# Patient Record
Sex: Female | Born: 1975 | Race: Black or African American | Hispanic: No | Smoking: Current some day smoker
Health system: Southern US, Community
[De-identification: ages and names within clinical notes are randomized; demographics above are authoritative.]

## PROBLEM LIST (undated history)

## (undated) DIAGNOSIS — N926 Irregular menstruation, unspecified: Secondary | ICD-10-CM

## (undated) DIAGNOSIS — I10 Essential (primary) hypertension: Secondary | ICD-10-CM

## (undated) DIAGNOSIS — Z8719 Personal history of other diseases of the digestive system: Secondary | ICD-10-CM

## (undated) DIAGNOSIS — E079 Disorder of thyroid, unspecified: Secondary | ICD-10-CM

## (undated) DIAGNOSIS — M549 Dorsalgia, unspecified: Secondary | ICD-10-CM

## (undated) DIAGNOSIS — Z8639 Personal history of other endocrine, nutritional and metabolic disease: Secondary | ICD-10-CM

## (undated) DIAGNOSIS — K760 Fatty (change of) liver, not elsewhere classified: Secondary | ICD-10-CM

## (undated) DIAGNOSIS — D219 Benign neoplasm of connective and other soft tissue, unspecified: Secondary | ICD-10-CM

## (undated) DIAGNOSIS — T8859XA Other complications of anesthesia, initial encounter: Secondary | ICD-10-CM

## (undated) DIAGNOSIS — K439 Ventral hernia without obstruction or gangrene: Secondary | ICD-10-CM

## (undated) DIAGNOSIS — G589 Mononeuropathy, unspecified: Secondary | ICD-10-CM

## (undated) DIAGNOSIS — M1990 Unspecified osteoarthritis, unspecified site: Secondary | ICD-10-CM

## (undated) DIAGNOSIS — F32A Depression, unspecified: Secondary | ICD-10-CM

## (undated) DIAGNOSIS — D649 Anemia, unspecified: Secondary | ICD-10-CM

## (undated) DIAGNOSIS — F419 Anxiety disorder, unspecified: Secondary | ICD-10-CM

## (undated) DIAGNOSIS — T4145XA Adverse effect of unspecified anesthetic, initial encounter: Secondary | ICD-10-CM

## (undated) DIAGNOSIS — N920 Excessive and frequent menstruation with regular cycle: Secondary | ICD-10-CM

## (undated) DIAGNOSIS — F329 Major depressive disorder, single episode, unspecified: Secondary | ICD-10-CM

## (undated) DIAGNOSIS — E669 Obesity, unspecified: Secondary | ICD-10-CM

## (undated) DIAGNOSIS — K869 Disease of pancreas, unspecified: Secondary | ICD-10-CM

## (undated) HISTORY — PX: OTHER SURGICAL HISTORY: SHX169

## (undated) HISTORY — DX: Dorsalgia, unspecified: M54.9

## (undated) HISTORY — DX: Anxiety disorder, unspecified: F41.9

## (undated) HISTORY — PX: JOINT REPLACEMENT: SHX530

## (undated) HISTORY — PX: HERNIA REPAIR: SHX51

## (undated) HISTORY — DX: Benign neoplasm of connective and other soft tissue, unspecified: D21.9

## (undated) HISTORY — PX: CHOLECYSTECTOMY: SHX55

## (undated) HISTORY — DX: Disorder of thyroid, unspecified: E07.9

## (undated) HISTORY — PX: THYROIDECTOMY, PARTIAL: SHX18

---

## 2001-12-10 ENCOUNTER — Other Ambulatory Visit: Admission: RE | Admit: 2001-12-10 | Discharge: 2001-12-10 | Payer: Self-pay | Admitting: Internal Medicine

## 2001-12-17 ENCOUNTER — Encounter: Admission: RE | Admit: 2001-12-17 | Discharge: 2001-12-17 | Payer: Self-pay | Admitting: Internal Medicine

## 2001-12-17 ENCOUNTER — Encounter: Payer: Self-pay | Admitting: Internal Medicine

## 2002-12-24 ENCOUNTER — Other Ambulatory Visit: Admission: RE | Admit: 2002-12-24 | Discharge: 2002-12-24 | Payer: Self-pay | Admitting: Internal Medicine

## 2002-12-30 ENCOUNTER — Encounter: Payer: Self-pay | Admitting: Internal Medicine

## 2002-12-30 ENCOUNTER — Encounter: Admission: RE | Admit: 2002-12-30 | Discharge: 2002-12-30 | Payer: Self-pay | Admitting: Internal Medicine

## 2003-01-24 ENCOUNTER — Encounter (HOSPITAL_COMMUNITY): Admission: RE | Admit: 2003-01-24 | Discharge: 2003-04-24 | Payer: Self-pay | Admitting: General Surgery

## 2003-01-25 ENCOUNTER — Encounter: Payer: Self-pay | Admitting: General Surgery

## 2003-02-04 ENCOUNTER — Encounter (INDEPENDENT_AMBULATORY_CARE_PROVIDER_SITE_OTHER): Payer: Self-pay | Admitting: *Deleted

## 2003-02-04 ENCOUNTER — Encounter: Payer: Self-pay | Admitting: General Surgery

## 2003-02-24 ENCOUNTER — Encounter: Payer: Self-pay | Admitting: General Surgery

## 2003-03-04 ENCOUNTER — Encounter (INDEPENDENT_AMBULATORY_CARE_PROVIDER_SITE_OTHER): Payer: Self-pay | Admitting: Specialist

## 2003-03-04 ENCOUNTER — Observation Stay (HOSPITAL_COMMUNITY): Admission: RE | Admit: 2003-03-04 | Discharge: 2003-03-05 | Payer: Self-pay | Admitting: General Surgery

## 2003-05-04 ENCOUNTER — Emergency Department (HOSPITAL_COMMUNITY): Admission: EM | Admit: 2003-05-04 | Discharge: 2003-05-04 | Payer: Self-pay | Admitting: Emergency Medicine

## 2004-05-15 ENCOUNTER — Other Ambulatory Visit: Admission: RE | Admit: 2004-05-15 | Discharge: 2004-05-15 | Payer: Self-pay | Admitting: Internal Medicine

## 2005-05-15 ENCOUNTER — Other Ambulatory Visit: Admission: RE | Admit: 2005-05-15 | Discharge: 2005-05-15 | Payer: Self-pay | Admitting: Internal Medicine

## 2007-03-18 ENCOUNTER — Other Ambulatory Visit: Admission: RE | Admit: 2007-03-18 | Discharge: 2007-03-18 | Payer: Self-pay | Admitting: *Deleted

## 2008-10-04 ENCOUNTER — Emergency Department (HOSPITAL_COMMUNITY): Admission: EM | Admit: 2008-10-04 | Discharge: 2008-10-04 | Payer: Self-pay | Admitting: *Deleted

## 2009-08-29 ENCOUNTER — Other Ambulatory Visit: Admission: RE | Admit: 2009-08-29 | Discharge: 2009-08-29 | Payer: Self-pay | Admitting: Family Medicine

## 2010-10-25 LAB — COMPREHENSIVE METABOLIC PANEL
ALT: 26 U/L (ref 0–35)
AST: 22 U/L (ref 0–37)
CO2: 24 mEq/L (ref 19–32)
Chloride: 105 mEq/L (ref 96–112)
Creatinine, Ser: 0.73 mg/dL (ref 0.4–1.2)
GFR calc Af Amer: >60 mL/min (ref >60)
GFR calc non Af Amer: >60 mL/min (ref >60)
Sodium: 136 mEq/L (ref 135–145)
Total Bilirubin: 0.7 mg/dL (ref 0.3–1.2)

## 2010-10-25 LAB — URINE MICROSCOPIC-ADD ON

## 2010-10-25 LAB — RAPID URINE DRUG SCREEN, HOSP PERFORMED
Amphetamines: NOT DETECTED
Barbiturates: NOT DETECTED

## 2010-10-25 LAB — POCT I-STAT, CHEM 8
Calcium, Ion: 1.12 mmol/L (ref 1.12–1.32)
HCT: 43 % (ref 36.0–46.0)
TCO2: 23 mmol/L (ref 0–100)

## 2010-10-25 LAB — DIFFERENTIAL
Basophils Absolute: 0 10*3/uL (ref 0.0–0.1)
Basophils Relative: 0 % (ref 0–1)
Eosinophils Absolute: 0 10*3/uL (ref 0.0–0.7)
Eosinophils Relative: 0 % (ref 0–5)

## 2010-10-25 LAB — CBC
MCV: 91.5 fL (ref 78.0–100.0)
RBC: 4.46 MIL/uL (ref 3.87–5.11)
WBC: 9.9 10*3/uL (ref 4.0–10.5)

## 2010-10-25 LAB — URINALYSIS, ROUTINE W REFLEX MICROSCOPIC
Bilirubin Urine: NEGATIVE
Glucose, UA: NEGATIVE mg/dL
Nitrite: NEGATIVE
Specific Gravity, Urine: 1.02 (ref 1.005–1.030)
pH: 5 (ref 5.0–8.0)

## 2010-10-25 LAB — LIPASE, BLOOD: Lipase: 30 U/L (ref 11–59)

## 2010-10-25 LAB — POCT CARDIAC MARKERS

## 2010-11-30 NOTE — Op Note (Signed)
Rachel, [name]                        ACCOUNT NO.:  [id number]   MEDICAL RECORD NO.:  [id number]                   PATIENT TYPE:  OBV   LOCATION:  0462                                 FACILITY:  [facility]   PHYSICIAN:  Ollen Gross. Vernell Morgans, M.D.              DATE OF BIRTH:  1975-11-26   DATE OF PROCEDURE:  03/04/2003  DATE OF DISCHARGE:  03/05/2003                                 OPERATIVE REPORT   PREOPERATIVE DIAGNOSIS:  Right thyroid nodule.   POSTOPERATIVE DIAGNOSIS:  Right thyroid nodule.   PROCEDURE:  Right thyroid lobectomy and isthmectomy.   ANESTHESIA:  General endotracheal.   DESCRIPTION OF PROCEDURE:  After informed consent was obtained, the patient  was brought to the operating room, placed in supine position on the  operating table. After adequate induction of general anesthesia, a roll was  placed behind the patient's shoulders to extend her neck. The patient's neck  was the prepped with Betadine and draped in the usual sterile manner. A low  transverse collar incision was made about 2 fingerbreadths above the sternal  notch. Using a 15 blade knife, this incision was carried down through the  skin and subcutaneous tissue and platysma using the electrocautery.  Subplatysmal flaps were then created by a combination of blunt finger  dissection and sharp dissection with the electrocautery. She was noted to  have some very large anterior neck veins. A Mahorner retractor was then used  to retract the subplatysmal flaps superiorly and inferiorly. Dissection was  then carried out on the midline down to the trachea until the isthmus of the  thyroid was encountered. Several of these large anterior bridging neck veins  required suture ligation with 3-0 Vicryl stitch. Once the midline was found,  some blunt dissection was carried out laterally on the right side freeing  the strap muscles from the anterior surface of the thyroid. The strap  muscles were retracted laterally  with an [employer]. Initially dissection was  carried out on the inferior edge of the thyroid gland so that the loose  investing tissue was dissected bluntly with a right angled clamp and divided  with the electrocautery. Several small veins on the anterior surface of the  thyroid were divided between small clips. Once this was accomplished, the  thyroid gland was a little bit more mobile and able to be rolled anteriorly.  Attention was then turned to the superior edge of the thyroid where again  blunt dissection was carried out until the superior pole of the thyroid was  fairly well defined. Dissection was carried out on the superior pole and the  superior pole vessels were surrounded between 2-0 silk ties and medium clips  and the superior pole vessels were divided. This allowed the further  mobilization of the right lobe of the thyroid to be rolled anteriorly. The  inferior thyroid artery and middle thyroid vein were also identified at its  junction with the thyroid and these were divided between small clips. The  recurrent laryngeal nerve and superior parathyroid gland were identified and  kept away from the dissection area. Once this was accomplished, the thyroid  gland on the right was able to be rolled up onto the top of the trachea and  its attachments to the trachea were divided sharply with electrocautery. The  isthmus was then also freed from the trachea using sharp dissection with the  electrocautery. The left lateral edge of the isthmus was then clamped with  hemostats and divided sharply with the electrocautery and ligated with 3-0  Vicryl U stitches. The right thyroid lobe and isthmus were then sent to  pathology for frozen section. The frozen section on these returned what  appeared to be a hyperplastic nodule but no malignancy was identified on the  frozen. At this point, the operation was stopped, the thyroid bed in the  right neck was inspected and was found to be  completely hemostatic. A small  piece of Surgicel was placed in the bed. The midline strap muscles were  reapproximated using figure-of-eight 3-0 Vicryl stitches. The platysma was  reapproximated using interrupted 3-0 Vicryl stitches and the skin was  closed with a running 4-0 Monocryl subcuticular stitch. Benzoin, Steri-  Strips and sterile dressings were applied. The patient tolerated the  procedure well. At the end of the case, all sponge, needle and instrument  counts were correct. The patient was awakened and taken to the recovery room  in stable condition.                                               Ollen Gross. Vernell Morgans, M.D.    PST/MEDQ  D:  03/06/2003  T:  03/07/2003  Job:  [id number]

## 2012-11-11 ENCOUNTER — Ambulatory Visit
Admission: RE | Admit: 2012-11-11 | Discharge: 2012-11-11 | Disposition: A | Discharge status: Home / Self Care | Payer: BC Managed Care – PPO | Source: Ambulatory Visit | Attending: Family Medicine | Admitting: Family Medicine

## 2012-11-11 ENCOUNTER — Other Ambulatory Visit: Payer: Self-pay | Admitting: Family Medicine

## 2012-11-11 DIAGNOSIS — R52 Pain, unspecified: Secondary | ICD-10-CM

## 2013-02-08 ENCOUNTER — Other Ambulatory Visit (HOSPITAL_COMMUNITY)
Admission: RE | Admit: 2013-02-08 | Discharge: 2013-02-08 | Disposition: A | Discharge status: Home / Self Care | Payer: BC Managed Care – PPO | Source: Ambulatory Visit | Attending: Family Medicine | Admitting: Family Medicine

## 2013-02-08 ENCOUNTER — Other Ambulatory Visit: Payer: Self-pay

## 2013-02-08 DIAGNOSIS — Z01419 Encounter for gynecological examination (general) (routine) without abnormal findings: Secondary | ICD-10-CM | POA: Insufficient documentation

## 2014-11-15 ENCOUNTER — Other Ambulatory Visit: Payer: Self-pay | Admitting: Family

## 2014-11-15 DIAGNOSIS — R1010 Upper abdominal pain, unspecified: Secondary | ICD-10-CM

## 2014-11-18 ENCOUNTER — Other Ambulatory Visit: Payer: BC Managed Care – PPO

## 2014-11-21 ENCOUNTER — Ambulatory Visit
Admission: RE | Admit: 2014-11-21 | Discharge: 2014-11-21 | Disposition: A | Discharge status: Home / Self Care | Payer: BC Managed Care – PPO | Source: Ambulatory Visit | Attending: Family | Admitting: Family

## 2014-11-21 DIAGNOSIS — R1010 Upper abdominal pain, unspecified: Secondary | ICD-10-CM

## 2014-12-16 ENCOUNTER — Other Ambulatory Visit: Payer: Self-pay

## 2014-12-16 DIAGNOSIS — R1013 Epigastric pain: Secondary | ICD-10-CM

## 2014-12-16 NOTE — Addendum Note (Signed)
Addended by: Odis Hollingshead on: 12/16/2014 10:28 AM   Modules accepted: Orders

## 2014-12-26 ENCOUNTER — Other Ambulatory Visit: Payer: Self-pay

## 2014-12-26 ENCOUNTER — Ambulatory Visit
Admission: RE | Admit: 2014-12-26 | Discharge: 2014-12-26 | Disposition: A | Discharge status: Home / Self Care | Payer: BC Managed Care – PPO | Source: Ambulatory Visit | Attending: General Surgery | Admitting: General Surgery

## 2014-12-26 DIAGNOSIS — K869 Disease of pancreas, unspecified: Secondary | ICD-10-CM

## 2014-12-26 DIAGNOSIS — K439 Ventral hernia without obstruction or gangrene: Secondary | ICD-10-CM

## 2014-12-26 HISTORY — DX: Ventral hernia without obstruction or gangrene: K43.9

## 2014-12-26 MED ORDER — IOPAMIDOL (ISOVUE-300) INJECTION 61%
125.0000 mL | Freq: Once | INTRAVENOUS | Status: AC | PRN
  Administered 2014-12-26: 125 mL via INTRAVENOUS

## 2014-12-26 NOTE — Addendum Note (Signed)
Addended by: Stark Klein on: 12/26/2014 04:00 PM   Modules accepted: Orders

## 2015-01-05 ENCOUNTER — Other Ambulatory Visit: Payer: Self-pay | Admitting: General Surgery

## 2015-01-05 DIAGNOSIS — K869 Disease of pancreas, unspecified: Secondary | ICD-10-CM

## 2015-01-08 ENCOUNTER — Ambulatory Visit
Admission: RE | Admit: 2015-01-08 | Discharge: 2015-01-08 | Disposition: A | Discharge status: Home / Self Care | Payer: BC Managed Care – PPO | Source: Ambulatory Visit | Attending: General Surgery | Admitting: General Surgery

## 2015-01-08 ENCOUNTER — Inpatient Hospital Stay: Admission: RE | Admit: 2015-01-08 | Payer: BC Managed Care – PPO | Source: Ambulatory Visit

## 2015-01-08 DIAGNOSIS — K869 Disease of pancreas, unspecified: Secondary | ICD-10-CM

## 2015-01-08 MED ORDER — GADOBENATE DIMEGLUMINE 529 MG/ML IV SOLN
20.0000 mL | Freq: Once | INTRAVENOUS | Status: AC | PRN
  Administered 2015-01-08: 20 mL via INTRAVENOUS

## 2015-01-25 ENCOUNTER — Encounter: Payer: Self-pay | Admitting: General Surgery

## 2015-01-25 NOTE — Progress Notes (Signed)
Rachel Warren 01/25/2015 9:39 AM Location: [facility] Patient #: [id number] DOB: 1975-10-27 Married / Language: English / Race: Black or African American Female  History of Present Illness Rachel Hollingshead MD; 01/25/2015 10:10 AM) Patient words: hernia.  The patient is a 39 year old female   Note:She is here for a follow-up visit to discuss her ventral hernia and surgery for it as well as her IPMN. She is interested in having surgery for the ventral hernia. As for the IPMN and, she will need a follow-up MRI June 2017 and she is aware of this.   Allergies (Sonya Bynum, CMA; 01/25/2015 9:39 AM) No Known Drug Allergies06/08/2014  Medication History (Sonya Bynum, CMA; 01/25/2015 9:39 AM) Hydrocodone-Acetaminophen (10-325MG  Tablet, Oral) Active. No Current Medications (Taken starting 01/25/2015) Meloxicam (15MG  Tablet, Oral) Active. Medications Reconciled  Vitals (Sonya Bynum CMA; 01/25/2015 9:39 AM) 01/25/2015 9:39 AM Weight: 288.2 lb Height: 68in Body Surface Area: 2.5 m Body Mass Index: 43.82 kg/m Temp.: 64F(Temporal)  Pulse: 81 (Regular)  BP: 134/80 (Sitting, Left Arm, Standard)    Physical Exam Rachel Hollingshead MD; 01/25/2015 10:10 AM) The physical exam findings are as follows: Note:General-obese female in no distress.  Abdomen-palpable bulge with a cough 5 cm superior to the umbilicus.    Assessment & Plan Rachel Hollingshead MD; 01/25/2015 10:07 AM) VENTRAL HERNIA WITHOUT OBSTRUCTION OR GANGRENE (553.20  K43.9) Impression: This is symptomatic. She is interested in repair.  Plan: Laparoscopic ventral hernia repair with mesh. I have discussed the procedure, risks, and aftercare. Risks include but are not limited to bleeding, infection, wound healing problems, anesthesia, recurrence, accidental injury to intra-abdominal organs-such as intestine, liver, spleen, bladder, etc. We also discussed the rare complication of mesh rejection. All  questions were answered.  Jackolyn Confer, MD

## 2015-02-22 ENCOUNTER — Encounter (HOSPITAL_COMMUNITY): Payer: Self-pay

## 2015-02-22 ENCOUNTER — Encounter (HOSPITAL_COMMUNITY)
Admission: RE | Admit: 2015-02-22 | Discharge: 2015-02-22 | Disposition: A | Discharge status: Home / Self Care | Payer: BC Managed Care – PPO | Source: Ambulatory Visit | Attending: General Surgery | Admitting: General Surgery

## 2015-02-22 DIAGNOSIS — K439 Ventral hernia without obstruction or gangrene: Secondary | ICD-10-CM | POA: Insufficient documentation

## 2015-02-22 DIAGNOSIS — Z01818 Encounter for other preprocedural examination: Secondary | ICD-10-CM | POA: Diagnosis present

## 2015-02-22 HISTORY — DX: Anemia, unspecified: D64.9

## 2015-02-22 HISTORY — DX: Mononeuropathy, unspecified: G58.9

## 2015-02-22 HISTORY — DX: Unspecified osteoarthritis, unspecified site: M19.90

## 2015-02-22 LAB — COMPREHENSIVE METABOLIC PANEL
ALT: 19 U/L (ref 14–54)
ANION GAP: 7 (ref 5–15)
AST: 22 U/L (ref 15–41)
Albumin: 3.7 g/dL (ref 3.5–5.0)
Alkaline Phosphatase: 56 U/L (ref 38–126)
BUN: 10 mg/dL (ref 6–20)
CALCIUM: 8.7 mg/dL — AB (ref 8.9–10.3)
CO2: 27 mmol/L (ref 22–32)
CREATININE: 0.69 mg/dL (ref 0.44–1.00)
Chloride: 105 mmol/L (ref 101–111)
GFR calc Af Amer: >60 mL/min (ref >60)
GFR calc non Af Amer: >60 mL/min (ref >60)
Glucose, Bld: 97 mg/dL (ref 65–99)
Potassium: 3.8 mmol/L (ref 3.5–5.1)
Sodium: 139 mmol/L (ref 135–145)
Total Bilirubin: 0.4 mg/dL (ref 0.3–1.2)
Total Protein: 6.9 g/dL (ref 6.5–8.1)

## 2015-02-22 LAB — CBC WITH DIFFERENTIAL/PLATELET
Basophils Absolute: 0 10*3/uL (ref 0.0–0.1)
Basophils Relative: 0 % (ref 0–1)
EOS ABS: 0 10*3/uL (ref 0.0–0.7)
Eosinophils Relative: 0 % (ref 0–5)
HCT: 33 % — ABNORMAL LOW (ref 36.0–46.0)
Hemoglobin: 10 g/dL — ABNORMAL LOW (ref 12.0–15.0)
LYMPHS ABS: 1.3 10*3/uL (ref 0.7–4.0)
Lymphocytes Relative: 23 % (ref 12–46)
MCH: 22.3 pg — ABNORMAL LOW (ref 26.0–34.0)
MCHC: 30.3 g/dL (ref 30.0–36.0)
MCV: 73.7 fL — ABNORMAL LOW (ref 78.0–100.0)
Monocytes Absolute: 0.4 10*3/uL (ref 0.1–1.0)
Monocytes Relative: 7 % (ref 3–12)
Neutro Abs: 3.9 10*3/uL (ref 1.7–7.7)
Neutrophils Relative %: 70 % (ref 43–77)
Platelets: 217 10*3/uL (ref 150–400)
RBC: 4.48 MIL/uL (ref 3.87–5.11)
RDW: 16.5 % — AB (ref 11.5–15.5)
WBC: 5.6 10*3/uL (ref 4.0–10.5)

## 2015-02-22 LAB — ABO/RH: ABO/RH(D): B POS

## 2015-02-22 LAB — PROTIME-INR
INR: 1 (ref 0.00–1.49)
Prothrombin Time: 13.4 seconds (ref 11.6–15.2)

## 2015-02-22 LAB — HCG, SERUM, QUALITATIVE: PREG SERUM: NEGATIVE

## 2015-02-22 NOTE — Patient Instructions (Addendum)
Rachel Warren  02/22/2015   Your procedure is scheduled on: February 27, 2015  Report to [facility] Main  Entrance take [place]  elevators to 3rd floor to  [place] at 5:30 AM.  Call this number if you have problems the morning of surgery [phone]   Remember: ONLY 1 PERSON MAY GO WITH YOU TO SHORT STAY TO GET  READY MORNING OF [place].  Do not eat food or drink liquids :After Midnight.     Take these medicines the morning of surgery with A SIP OF WATER: none                               You may not have any metal on your body including hair pins and              piercings  Do not wear jewelry, make-up, lotions, powders or perfumes, deodorant             Do not wear nail polish.  Do not shave  48 hours prior to surgery.             Do not bring valuables to the hospital. [place].  Contacts, dentures or bridgework may not be worn into surgery.  Leave suitcase in the car. After surgery it may be brought to your room.       Special Instructions: coughing and deep breathing exercises, leg exercises              Please read over the following fact sheets you were given: _____________________________________________________________________             [facility] - Preparing for Surgery Before surgery, you can play an important role.  Because skin is not sterile, your skin needs to be as free of germs as possible.  You can reduce the number of germs on your skin by washing with CHG (chlorahexidine gluconate) soap before surgery.  CHG is an antiseptic cleaner which kills germs and bonds with the skin to continue killing germs even after washing. Please DO NOT use if you have an allergy to CHG or antibacterial soaps.  If your skin becomes reddened/irritated stop using the CHG and inform your nurse when you arrive at Short Stay. Do not shave (including legs and underarms) for at least 48 hours prior to  the first CHG shower.  You may shave your face/neck. Please follow these instructions carefully:  1.  Shower with CHG Soap the night before surgery and the  morning of Surgery.  2.  If you choose to wash your hair, wash your hair first as usual with your  normal  shampoo.  3.  After you shampoo, rinse your hair and body thoroughly to remove the  shampoo.                           4.  Use CHG as you would any other liquid soap.  You can apply chg directly  to the skin and wash                       Gently with a scrungie or clean washcloth.  5.  Apply the CHG Soap  to your body ONLY FROM THE NECK DOWN.   Do not use on face/ open                           Wound or open sores. Avoid contact with eyes, ears mouth and genitals (private parts).                       Wash face,  Genitals (private parts) with your normal soap.             6.  Wash thoroughly, paying special attention to the area where your surgery  will be performed.  7.  Thoroughly rinse your body with warm water from the neck down.  8.  DO NOT shower/wash with your normal soap after using and rinsing off  the CHG Soap.                9.  Pat yourself dry with a clean towel.            10.  Wear clean pajamas.            11.  Place clean sheets on your bed the night of your first shower and do not  sleep with pets. Day of Surgery : Do not apply any lotions/deodorants the morning of surgery.  Please wear clean clothes to the hospital/surgery center.  FAILURE TO FOLLOW THESE INSTRUCTIONS MAY RESULT IN THE CANCELLATION OF YOUR SURGERY PATIENT SIGNATURE_________________________________  NURSE SIGNATURE__________________________________  ________________________________________________________________________

## 2015-02-22 NOTE — Progress Notes (Addendum)
12-26-14 - CT ABD/Pelvis - [facility] 01-08-15 - MR ABD W/Wo Contrast - [facility] 01-25-15 - LOV - Dr. Zella Richer - [facility]

## 2015-02-22 NOTE — Progress Notes (Signed)
02-22-15 - Faxed CBC w/diff lab results from preop visit on 02-22-15 to Dr. Zella Richer via [facility]

## 2015-02-26 MED ORDER — DEXTROSE 5 % IV SOLN
3.0000 g | INTRAVENOUS | Status: AC
  Administered 2015-02-27: 3 g via INTRAVENOUS
  Filled 2015-02-26: qty 3000

## 2015-02-27 ENCOUNTER — Encounter (HOSPITAL_COMMUNITY)
Admission: RE | Disposition: A | Discharge status: Home / Self Care | Payer: Self-pay | Source: Ambulatory Visit | Attending: General Surgery

## 2015-02-27 ENCOUNTER — Ambulatory Visit (HOSPITAL_COMMUNITY)
Admission: RE | Admit: 2015-02-27 | Discharge: 2015-02-28 | Disposition: A | Discharge status: Home / Self Care | Payer: BC Managed Care – PPO | Source: Ambulatory Visit | Attending: General Surgery | Admitting: General Surgery

## 2015-02-27 ENCOUNTER — Ambulatory Visit (HOSPITAL_COMMUNITY): Payer: BC Managed Care – PPO | Admitting: Anesthesiology

## 2015-02-27 ENCOUNTER — Encounter (HOSPITAL_COMMUNITY): Payer: Self-pay | Admitting: *Deleted

## 2015-02-27 DIAGNOSIS — Z6841 Body Mass Index (BMI) 40.0 and over, adult: Secondary | ICD-10-CM | POA: Insufficient documentation

## 2015-02-27 DIAGNOSIS — Z79899 Other long term (current) drug therapy: Secondary | ICD-10-CM | POA: Diagnosis not present

## 2015-02-27 DIAGNOSIS — K432 Incisional hernia without obstruction or gangrene: Secondary | ICD-10-CM | POA: Diagnosis not present

## 2015-02-27 DIAGNOSIS — K439 Ventral hernia without obstruction or gangrene: Secondary | ICD-10-CM | POA: Diagnosis not present

## 2015-02-27 DIAGNOSIS — M1990 Unspecified osteoarthritis, unspecified site: Secondary | ICD-10-CM | POA: Diagnosis not present

## 2015-02-27 DIAGNOSIS — F17210 Nicotine dependence, cigarettes, uncomplicated: Secondary | ICD-10-CM | POA: Diagnosis not present

## 2015-02-27 DIAGNOSIS — D649 Anemia, unspecified: Secondary | ICD-10-CM | POA: Diagnosis not present

## 2015-02-27 HISTORY — PX: LAPAROSCOPIC ASSISTED VENTRAL HERNIA REPAIR: SHX6312

## 2015-02-27 HISTORY — PX: INSERTION OF MESH: SHX5868

## 2015-02-27 LAB — TYPE AND SCREEN
ABO/RH(D): B POS
Antibody Screen: NEGATIVE

## 2015-02-27 SURGERY — REPAIR, HERNIA, VENTRAL, LAPAROSCOPY-ASSISTED
Anesthesia: General

## 2015-02-27 MED ORDER — GLYCOPYRROLATE 0.2 MG/ML IJ SOLN
INTRAMUSCULAR | Status: DC | PRN
  Administered 2015-02-27: 0.6 mg via INTRAVENOUS

## 2015-02-27 MED ORDER — BUPIVACAINE-EPINEPHRINE (PF) 0.25% -1:200000 IJ SOLN
INTRAMUSCULAR | Status: AC
  Filled 2015-02-27: qty 30

## 2015-02-27 MED ORDER — GLYCOPYRROLATE 0.2 MG/ML IJ SOLN
INTRAMUSCULAR | Status: AC
  Filled 2015-02-27: qty 3

## 2015-02-27 MED ORDER — MORPHINE SULFATE 2 MG/ML IJ SOLN
2.0000 mg | INTRAMUSCULAR | Status: DC | PRN
  Administered 2015-02-27: 2 mg via INTRAVENOUS
  Administered 2015-02-27 – 2015-02-28 (×3): 4 mg via INTRAVENOUS
  Filled 2015-02-27 (×2): qty 2
  Filled 2015-02-27: qty 1
  Filled 2015-02-27: qty 2

## 2015-02-27 MED ORDER — CEFAZOLIN SODIUM-DEXTROSE 2-3 GM-% IV SOLR
2.0000 g | Freq: Three times a day (TID) | INTRAVENOUS | Status: AC
  Administered 2015-02-27: 2 g via INTRAVENOUS
  Filled 2015-02-27 (×2): qty 50

## 2015-02-27 MED ORDER — ONDANSETRON HCL 4 MG/2ML IJ SOLN
INTRAMUSCULAR | Status: AC
  Filled 2015-02-27: qty 2

## 2015-02-27 MED ORDER — PROPOFOL 10 MG/ML IV BOLUS
INTRAVENOUS | Status: DC | PRN
  Administered 2015-02-27: 200 mg via INTRAVENOUS

## 2015-02-27 MED ORDER — KETOROLAC TROMETHAMINE 30 MG/ML IJ SOLN
INTRAMUSCULAR | Status: DC | PRN
  Administered 2015-02-27: 30 mg via INTRAVENOUS

## 2015-02-27 MED ORDER — MIDAZOLAM HCL 5 MG/5ML IJ SOLN
INTRAMUSCULAR | Status: DC | PRN
  Administered 2015-02-27: 2 mg via INTRAVENOUS

## 2015-02-27 MED ORDER — FENTANYL CITRATE (PF) 250 MCG/5ML IJ SOLN
INTRAMUSCULAR | Status: AC
  Filled 2015-02-27: qty 25

## 2015-02-27 MED ORDER — FENTANYL CITRATE (PF) 100 MCG/2ML IJ SOLN
INTRAMUSCULAR | Status: DC | PRN
  Administered 2015-02-27 (×2): 50 ug via INTRAVENOUS
  Administered 2015-02-27: 100 ug via INTRAVENOUS
  Administered 2015-02-27: 50 ug via INTRAVENOUS

## 2015-02-27 MED ORDER — MEPERIDINE HCL 50 MG/ML IJ SOLN: 6.2500 mg | INTRAMUSCULAR | Status: DC | PRN

## 2015-02-27 MED ORDER — DEXAMETHASONE SODIUM PHOSPHATE 10 MG/ML IJ SOLN
INTRAMUSCULAR | Status: AC
  Filled 2015-02-27: qty 1

## 2015-02-27 MED ORDER — KETOROLAC TROMETHAMINE 30 MG/ML IJ SOLN
INTRAMUSCULAR | Status: AC
  Filled 2015-02-27: qty 1

## 2015-02-27 MED ORDER — ONDANSETRON HCL 4 MG/2ML IJ SOLN
INTRAMUSCULAR | Status: DC | PRN
  Administered 2015-02-27: 4 mg via INTRAVENOUS

## 2015-02-27 MED ORDER — LACTATED RINGERS IV SOLN
INTRAVENOUS | Status: DC
  Administered 2015-02-27: 10:00:00 via INTRAVENOUS

## 2015-02-27 MED ORDER — MIDAZOLAM HCL 2 MG/2ML IJ SOLN: 0.5000 mg | Freq: Once | INTRAMUSCULAR | Status: DC | PRN

## 2015-02-27 MED ORDER — DEXAMETHASONE SODIUM PHOSPHATE 10 MG/ML IJ SOLN
INTRAMUSCULAR | Status: DC | PRN
  Administered 2015-02-27: 10 mg via INTRAVENOUS

## 2015-02-27 MED ORDER — NEOSTIGMINE METHYLSULFATE 10 MG/10ML IV SOLN
INTRAVENOUS | Status: AC
  Filled 2015-02-27: qty 1

## 2015-02-27 MED ORDER — BUPIVACAINE-EPINEPHRINE 0.25% -1:200000 IJ SOLN
INTRAMUSCULAR | Status: DC | PRN
  Administered 2015-02-27: 20 mL

## 2015-02-27 MED ORDER — HYDROMORPHONE HCL 1 MG/ML IJ SOLN: 0.2500 mg | INTRAMUSCULAR | Status: DC | PRN

## 2015-02-27 MED ORDER — ENOXAPARIN SODIUM 40 MG/0.4ML ~~LOC~~ SOLN
40.0000 mg | SUBCUTANEOUS | Status: DC
  Administered 2015-02-28: 40 mg via SUBCUTANEOUS
  Filled 2015-02-27 (×2): qty 0.4

## 2015-02-27 MED ORDER — LACTATED RINGERS IV SOLN
INTRAVENOUS | Status: DC | PRN
  Administered 2015-02-27 (×2): via INTRAVENOUS

## 2015-02-27 MED ORDER — HYDROMORPHONE HCL 2 MG/ML IJ SOLN
INTRAMUSCULAR | Status: AC
  Filled 2015-02-27: qty 1

## 2015-02-27 MED ORDER — SUCCINYLCHOLINE CHLORIDE 20 MG/ML IJ SOLN
INTRAMUSCULAR | Status: DC | PRN
  Administered 2015-02-27: 100 mg via INTRAVENOUS

## 2015-02-27 MED ORDER — LIDOCAINE HCL (CARDIAC) 20 MG/ML IV SOLN
INTRAVENOUS | Status: AC
  Filled 2015-02-27: qty 5

## 2015-02-27 MED ORDER — LIDOCAINE HCL (CARDIAC) 20 MG/ML IV SOLN
INTRAVENOUS | Status: DC | PRN
  Administered 2015-02-27: 50 mg via INTRAVENOUS

## 2015-02-27 MED ORDER — ONDANSETRON HCL 4 MG/2ML IJ SOLN: 4.0000 mg | INTRAMUSCULAR | Status: DC | PRN

## 2015-02-27 MED ORDER — ROCURONIUM BROMIDE 100 MG/10ML IV SOLN
INTRAVENOUS | Status: DC | PRN
  Administered 2015-02-27: 35 mg via INTRAVENOUS
  Administered 2015-02-27: 5 mg via INTRAVENOUS

## 2015-02-27 MED ORDER — NEOSTIGMINE METHYLSULFATE 10 MG/10ML IV SOLN
INTRAVENOUS | Status: DC | PRN
  Administered 2015-02-27: 4 mg via INTRAVENOUS

## 2015-02-27 MED ORDER — ROCURONIUM BROMIDE 100 MG/10ML IV SOLN
INTRAVENOUS | Status: AC
  Filled 2015-02-27: qty 1

## 2015-02-27 MED ORDER — ONDANSETRON 4 MG PO TBDP: 4.0000 mg | ORAL_TABLET | Freq: Four times a day (QID) | ORAL | Status: DC | PRN

## 2015-02-27 MED ORDER — PROMETHAZINE HCL 25 MG/ML IJ SOLN: 6.2500 mg | INTRAMUSCULAR | Status: DC | PRN

## 2015-02-27 MED ORDER — ZOLPIDEM TARTRATE 5 MG PO TABS
5.0000 mg | ORAL_TABLET | Freq: Once | ORAL | Status: AC
  Administered 2015-02-27: 5 mg via ORAL
  Filled 2015-02-27: qty 1

## 2015-02-27 MED ORDER — MIDAZOLAM HCL 2 MG/2ML IJ SOLN
INTRAMUSCULAR | Status: AC
  Filled 2015-02-27: qty 4

## 2015-02-27 MED ORDER — OXYCODONE HCL 5 MG PO TABS: 5.0000 mg | ORAL_TABLET | ORAL | Status: DC | PRN

## 2015-02-27 MED ORDER — PROPOFOL 10 MG/ML IV BOLUS
INTRAVENOUS | Status: AC
  Filled 2015-02-27: qty 20

## 2015-02-27 MED ORDER — HYDROMORPHONE HCL 1 MG/ML IJ SOLN
INTRAMUSCULAR | Status: DC | PRN
  Administered 2015-02-27: 1 mg via INTRAVENOUS
  Administered 2015-02-27 (×2): 0.5 mg via INTRAVENOUS

## 2015-02-27 MED ORDER — KCL IN DEXTROSE-NACL 20-5-0.9 MEQ/L-%-% IV SOLN
INTRAVENOUS | Status: DC
  Administered 2015-02-27: 15:00:00 via INTRAVENOUS
  Administered 2015-02-28: 1000 mL via INTRAVENOUS
  Filled 2015-02-27 (×5): qty 1000

## 2015-02-27 SURGICAL SUPPLY — 43 items
APL SKNCLS STERI-STRIP NONHPOA (GAUZE/BANDAGES/DRESSINGS) ×1
BANDAGE ADH SHEER 1  50/CT (GAUZE/BANDAGES/DRESSINGS) ×12 IMPLANT
BENZOIN TINCTURE PRP APPL 2/3 (GAUZE/BANDAGES/DRESSINGS) ×3 IMPLANT
BINDER ABDOMINAL 12 ML 46-62 (SOFTGOODS) IMPLANT
CABLE HIGH FREQUENCY MONO STRZ (ELECTRODE) IMPLANT
CHLORAPREP W/TINT 26ML (MISCELLANEOUS) ×3 IMPLANT
CLOSURE WOUND 1/2 X4 (GAUZE/BANDAGES/DRESSINGS) ×1
COVER SURGICAL LIGHT HANDLE (MISCELLANEOUS) ×3 IMPLANT
DECANTER SPIKE VIAL GLASS SM (MISCELLANEOUS) ×3 IMPLANT
DEVICE TROCAR PUNCTURE CLOSURE (ENDOMECHANICALS) ×3 IMPLANT
DISSECTOR BLUNT TIP ENDO 5MM (MISCELLANEOUS) IMPLANT
DRAPE INCISE IOBAN 66X45 STRL (DRAPES) ×3 IMPLANT
DRAPE LAPAROSCOPIC ABDOMINAL (DRAPES) ×3 IMPLANT
DRSG TEGADERM 2-3/8X2-3/4 SM (GAUZE/BANDAGES/DRESSINGS) ×9 IMPLANT
ELECT REM PT RETURN 9FT ADLT (ELECTROSURGICAL) ×3
ELECTRODE REM PT RTRN 9FT ADLT (ELECTROSURGICAL) ×1 IMPLANT
GLOVE ECLIPSE 8.0 STRL XLNG CF (GLOVE) ×3 IMPLANT
GLOVE INDICATOR 8.0 STRL GRN (GLOVE) ×6 IMPLANT
GOWN STRL REUS W/TWL XL LVL3 (GOWN DISPOSABLE) ×6 IMPLANT
KIT BASIN OR (CUSTOM PROCEDURE TRAY) ×3 IMPLANT
MESH VENTRALIGHT ST 6X8 (Mesh Specialty) ×3 IMPLANT
MESH VENTRLGHT ELLIPSE 8X6XMFL (Mesh Specialty) IMPLANT
NDL SPNL 22GX3.5 QUINCKE BK (NEEDLE) ×1 IMPLANT
NEEDLE SPNL 22GX3.5 QUINCKE BK (NEEDLE) ×3 IMPLANT
PEN SKIN MARKING BROAD (MISCELLANEOUS) ×3 IMPLANT
SCISSORS LAP 5X35 DISP (ENDOMECHANICALS) ×3 IMPLANT
SET IRRIG TUBING LAPAROSCOPIC (IRRIGATION / IRRIGATOR) IMPLANT
SHEARS HARMONIC ACE PLUS 36CM (ENDOMECHANICALS) IMPLANT
SLEEVE XCEL OPT CAN 5 100 (ENDOMECHANICALS) ×3 IMPLANT
SOLUTION ANTI FOG 6CC (MISCELLANEOUS) ×3 IMPLANT
STRIP CLOSURE SKIN 1/2X4 (GAUZE/BANDAGES/DRESSINGS) ×2 IMPLANT
SUT MNCRL AB 4-0 PS2 18 (SUTURE) ×3 IMPLANT
SUT NOVA NAB DX-16 0-1 5-0 T12 (SUTURE) ×2 IMPLANT
SUT PROLENE 0 CT 1 CR/8 (SUTURE) IMPLANT
TACKER 5MM HERNIA 3.5CML NAB (ENDOMECHANICALS) ×4 IMPLANT
TOWEL OR 17X26 10 PK STRL BLUE (TOWEL DISPOSABLE) ×3 IMPLANT
TRAY FOLEY W/METER SILVER 14FR (SET/KITS/TRAYS/PACK) ×3 IMPLANT
TRAY FOLEY W/METER SILVER 16FR (SET/KITS/TRAYS/PACK) ×3 IMPLANT
TRAY LAPAROSCOPIC (CUSTOM PROCEDURE TRAY) ×3 IMPLANT
TROCAR BLADELESS OPT 5 100 (ENDOMECHANICALS) ×3 IMPLANT
TROCAR XCEL BLUNT TIP 100MML (ENDOMECHANICALS) IMPLANT
TROCAR XCEL NON-BLD 11X100MML (ENDOMECHANICALS) IMPLANT
TUBING INSUFFLATION 10FT LAP (TUBING) ×3 IMPLANT

## 2015-02-27 NOTE — Op Note (Signed)
Operative Note  Rachel Warren female 39 y.o. 02/27/2015  PREOPERATIVE DX:  Epigastric ventral hernia  POSTOPERATIVE DX:  Epigastric ventral hernia and periumbilical incisional hernia  PROCEDURE:   Laparoscopic repair of epigastric ventral hernia and periumbilical incisional hernia with mesh         Surgeon: Odis Hollingshead   Assistants: Roseanne Reno, PA-S  Anesthesia: General endotracheal anesthesia  Indications:   This is a 39 year old female who developed a painful epigastric bulge. CT scan demonstrated an epigastric hernia containing fat. She also had a benign pancreatic cystic lesion that needs imaging follow-up in one year. She now presents for repair of the hernia.    Procedure Detail:  She was brought to the operating room placed supine on the operating table and a general anesthetic was given. The abdominal wall was widely sterilely prepped and draped.  She was placed in slight reverse Trendelenburg position. A 5 mm incision was made in the left subcostal area. Using a 5 mm Optiview trocar and laparoscope, access was gained into the peritoneal cavity. A pneumoperitoneum was created. The area underneath the trocar was inspected and there is no evidence of bleeding or organ injury. The epigastric ventral hernia was visualized with omentum going up in it. The omentum spontaneously reduce. Also noted was a periumbilical hernia below a supraumbilical incision done from a laparoscopic cholecystectomy.  Using a spinal needle I measured 4 cm from the edges of both hernias. I then brought a 15 cm x 20 cm piece of ventral light composite mesh into the field. 4 anchoring sutures of #1 Novafil were placed in 12, 3, 6, and 9:00 positions. The mesh was hydrated. An 11 mm trocar was placed in the right mid abdomen. The mesh was then placed through the 11 mm trocar into the abdominal cavity. The mesh was then deployed with the rough side facing the abdominal wall and a nonadherent side facing  the viscera. 4 stab incisions were then made in the abdominal wall to coincide with the anchoring stitches. The anchoring stitches were then brought up across the fascial bridge and then tied down anchoring the mesh to the anterior abdominal wall. Using spiral tacks, the periphery of the mesh was then anchored to the abdominal wall as well. This provided for adequate coverage with good overlap of the terminal hernia defects.  A four-quadrant and central inspection performed. There is no evidence of bleeding or organ injury. The pneumoperitoneum was released and I watched the omentum approximate the mesh. The trocars were removed.  All skin incisions were closed with 4-0 Monocryl subcuticular stitches. Steri-Strips and sterile dressings were applied.  She tolerated the procedure well, with no apparent complications, and was taken to the recovery room in satisfactory condition.   Estimated Blood Loss:  less than 100 mL               Complications:  * No complications entered in OR log *         Disposition: PACU - hemodynamically stable.         Condition: stable

## 2015-02-27 NOTE — Anesthesia Postprocedure Evaluation (Signed)
  Anesthesia Post-op Note  Patient: Rachel Warren  Procedure(s) Performed: Procedure(s): LAPAROSCOPIC VENTRAL HERNIA REPAIR WITH MESH (N/A) INSERTION OF MESH (N/A)  Patient Location: PACU  Anesthesia Type:General  Level of Consciousness: awake, alert , oriented and patient cooperative  Airway and Oxygen Therapy: Patient Spontanous Breathing and Patient connected to nasal cannula oxygen  Post-op Pain: none  Post-op Assessment: Post-op Vital signs reviewed, Patient's Cardiovascular Status Stable, Respiratory Function Stable, Patent Airway, No signs of Nausea or vomiting and Pain level controlled              Post-op Vital Signs: Reviewed and stable  Last Vitals:  Filed Vitals:   02/27/15 1015  BP: 124/63  Pulse: 78  Temp: 37 C  Resp: 18    Complications: No apparent anesthesia complications

## 2015-02-27 NOTE — H&P (Signed)
Rachel Warren is an 39 y.o. female.   Chief Complaint: Here for elective surgery HPI: She has a primary epigastric ventral hernia containing omentum and now presents for repair.  Past Medical History  Diagnosis Date  . Pinched nerve     in back  . Arthritis   . Anemia-Chronic     Past Surgical History  Procedure Laterality Date  . Thyroidectomy, partial    . Cholecystectomy    . Benign breast cyst removed      History reviewed. No pertinent family history. Social History:  reports that she has been smoking Cigarettes.  She has never used smokeless tobacco. She reports that she drinks alcohol. She reports that she uses illicit drugs (Marijuana).  Allergies: No Known Allergies  Medications Prior to Admission  Medication Sig Dispense Refill  . Cholecalciferol (VITAMIN D PO) Take 1 tablet by mouth every morning.    Marland Kitchen HYDROcodone-acetaminophen (NORCO) 10-325 MG per tablet Take 1 tablet by mouth every 4 (four) hours as needed for moderate pain.    . meloxicam (MOBIC) 15 MG tablet Take 15 mg by mouth every morning.    . Multiple Vitamin (MULTIVITAMIN WITH MINERALS) TABS tablet Take 1 tablet by mouth every morning.    . naproxen sodium (ANAPROX) 220 MG tablet Take 440 mg by mouth 2 (two) times daily as needed (pain).      No results found for this or any previous visit (from the past 48 hour(s)). No results found.  Review of Systems  Constitutional: Negative for fever and chills.  Gastrointestinal: Negative for nausea, vomiting and diarrhea.    Blood pressure 171/94, pulse 80, temperature 98.9 F (37.2 C), temperature source Oral, resp. rate 18, height 5\' 8"  (1.727 m), weight 134.265 kg (296 lb), last menstrual period 02/01/2015, SpO2 100 %. Physical Exam  Constitutional: No distress.  Obese female  Cardiovascular: Normal rate and regular rhythm.   Respiratory: Effort normal and breath sounds normal.  GI:  Epigastric bulge present.  Neurological: She is alert.  Skin: Skin  is warm and dry.  Psychiatric: She has a normal mood and affect. Her behavior is normal.     Assessment/Plan Epigastric ventral hernia containing omentum  Plan:  Laparoscopic ventral hernia repair with mesh.  Blessin Kanno J 02/27/2015, 7:16 AM

## 2015-02-27 NOTE — Plan of Care (Signed)
Problem: Phase I Progression Outcomes Goal: OOB as tolerated unless otherwise ordered Outcome: Completed/Met Date Met:  02/27/15 Pt one person assist when OOB. Pt moves well. Just needs a stand by assist.  Goal: Initial discharge plan identified Outcome: Completed/Met Date Met:  02/27/15 Pt plans to go home with the assistance of her daughter and husband.

## 2015-02-27 NOTE — Anesthesia Preprocedure Evaluation (Addendum)
Anesthesia Evaluation  Patient identified by MRN, date of birth, ID band Patient awake    Reviewed: Allergy & Precautions, NPO status , Patient's Chart, lab work & pertinent test results  History of Anesthesia Complications Negative for: history of anesthetic complications  Airway Mallampati: II  TM Distance: >3 FB Neck ROM: Full    Dental  (+) Chipped, Dental Advisory Given, Missing   Pulmonary Current Smoker,  breath sounds clear to auscultation        Cardiovascular - anginanegative cardio ROS  Rhythm:Regular Rate:Normal     Neuro/Psych negative neurological ROS     GI/Hepatic negative GI ROS, Neg liver ROS,   Endo/Other  Morbid obesity  Renal/GU negative Renal ROS     Musculoskeletal  (+) Arthritis -, Osteoarthritis,    Abdominal (+) + obese,   Peds  Hematology  (+) Blood dyscrasia (Hb 10.0), ,   Anesthesia Other Findings   Reproductive/Obstetrics                            Anesthesia Physical Anesthesia Plan  ASA: III  Anesthesia Plan: General   Post-op Pain Management:    Induction: Intravenous  Airway Management Planned: Oral ETT  Additional Equipment:   Intra-op Plan:   Post-operative Plan: Extubation in OR  Informed Consent: I have reviewed the patients History and Physical, chart, labs and discussed the procedure including the risks, benefits and alternatives for the proposed anesthesia with the patient or authorized representative who has indicated his/her understanding and acceptance.   Dental advisory given  Plan Discussed with: CRNA and Surgeon  Anesthesia Plan Comments: (Plan routine monitors, GETA)        Anesthesia Quick Evaluation

## 2015-02-27 NOTE — Discharge Instructions (Addendum)
[facility] _______[facility]   HERNIA REPAIR: POST OP INSTRUCTIONS  Always review your discharge instruction sheet given to you by the facility where your surgery was performed. IF YOU HAVE DISABILITY OR FAMILY LEAVE FORMS, YOU MUST BRING THEM TO THE OFFICE FOR PROCESSING.   DO NOT GIVE THEM TO YOUR DOCTOR.  1. A  prescription for pain medication may be given to you upon discharge.  Take your pain medication as prescribed, if needed.  If narcotic pain medicine is not needed, then you may take acetaminophen (Tylenol) or ibuprofen (Advil) as needed. 2. Take your usually prescribed medications unless otherwise directed. 3. If you need a refill on your pain medication, please contact your pharmacy.  They will contact our office to request authorization. Prescriptions will not be filled after 5 pm or on week-ends. 4. You should follow a light diet the first 24 hours after arrival home, such as soup and crackers, etc.  Be sure to include lots of fluids daily.  Resume your normal diet the day after surgery. 5. Most patients will experience some swelling and bruising around the umbilicus or in the groin and scrotum.  Ice packs and reclining will help.  Swelling and bruising can take several days to resolve.  6. It is common to experience some constipation if taking pain medication after surgery.  Increasing fluid intake and taking a stool softener (such as Colace) will usually help or prevent this problem from occurring.  A mild laxative (Milk of Magnesia or Miralax) should be taken according to package directions if there are no bowel movements after 48 hours. 7. Unless discharge instructions indicate otherwise, you may remove your bandages 72 hours after surgery, and you may shower at that time.  You may have steri-strips (small skin tapes) in place directly over the incision.  These strips should be left on the skin.  If your surgeon used skin glue on the incision, you may shower in 24 hours.   The glue will flake off over the next 2-3 weeks.  Any sutures or staples will be removed at the office during your follow-up visit. 8. ACTIVITIES:  You may resume regular (light) daily activities beginning the next day--such as daily self-care, walking, climbing stairs--gradually increasing activities as tolerated.  You may have sexual intercourse when it is comfortable.  Refrain from any heavy lifting or straining-nothing over 10 pounds for 6 weeks. a. You may drive when you are no longer taking prescription pain medication, you can comfortably wear a seatbelt, and you can safely maneuver your car and apply brakes. b. RETURN TO WORK:  _When released by MD_________________________________________________________ 9. You should see your doctor in the office for a follow-up appointment approximately 2-3 weeks after your surgery.  Make sure that you call for this appointment within a day or two after you arrive home to insure a convenient appointment time. 10. OTHER INSTRUCTIONS:  ____Wear abdominal binder.  Use incentive spirometer._____________________________________________________________________________________________________________________________________________________________________________________  WHEN TO CALL YOUR DOCTOR: 1. Fever over 101.0 2. Inability to urinate 3. Nausea and/or vomiting 4. Extreme swelling or bruising 5. Continued bleeding from incision. 6. Increased pain, redness, or drainage from the incision  The clinic staff is available to answer your questions during regular business hours.  Please dont hesitate to call and ask to speak to one of the nurses for clinical concerns.  If you have a medical emergency, go to the nearest emergency room or call 911.  A surgeon from [facility] is always on call  at the hospital   [address] [place], [place], [place]  [address] ?  [address], [place], [place]   [address] [phone] ? [phone] ? FAX  [phone] Web site: [email or web address]

## 2015-02-27 NOTE — Transfer of Care (Signed)
Immediate Anesthesia Transfer of Care Note  Patient: Rachel Warren  Procedure(s) Performed: Procedure(s): LAPAROSCOPIC VENTRAL HERNIA REPAIR WITH MESH (N/A) INSERTION OF MESH (N/A)  Patient Location: PACU  Anesthesia Type:General  Level of Consciousness: awake, alert  and oriented  Airway & Oxygen Therapy: Patient Spontanous Breathing and Patient connected to face mask oxygen  Post-op Assessment: Report given to RN and Post -op Vital signs reviewed and stable  Post vital signs: Reviewed and stable  Last Vitals:  Filed Vitals:   02/27/15 0546  BP: 171/94  Pulse: 80  Temp: 37.2 C  Resp: 18    Complications: No apparent anesthesia complications

## 2015-02-27 NOTE — Anesthesia Procedure Notes (Signed)
Procedure Name: Intubation Date/Time: 02/27/2015 7:35 AM Performed by: Noralyn Pick D Pre-anesthesia Checklist: Patient identified, Emergency Drugs available, Suction available and Patient being monitored Patient Re-evaluated:Patient Re-evaluated prior to inductionOxygen Delivery Method: Circle System Utilized Preoxygenation: Pre-oxygenation with 100% oxygen Intubation Type: IV induction Ventilation: Mask ventilation without difficulty Laryngoscope Size: Mac and 4 Tube type: Oral Tube size: 7.5 mm Number of attempts: 1 Airway Equipment and Method: Stylet and Oral airway Placement Confirmation: ETT inserted through vocal cords under direct vision,  positive ETCO2 and breath sounds checked- equal and bilateral Secured at: 21 cm Tube secured with: Tape Dental Injury: Teeth and Oropharynx as per pre-operative assessment

## 2015-02-27 NOTE — Interval H&P Note (Signed)
History and Physical Interval Note:  02/27/2015 7:22 AM  Rachel Warren  has presented today for surgery, with the diagnosis of Ventral Hernia  The various methods of treatment have been discussed with the patient and family. After consideration of risks, benefits and other options for treatment, the patient has consented to  Procedure(s): [place] (N/A) INSERTION OF MESH (N/A) as a surgical intervention .  The patient's history has been reviewed, patient examined, no change in status, stable for surgery.  I have reviewed the patient's chart and labs.  Questions were answered to the patient's satisfaction.     Sahaana Weitman Lenna Sciara

## 2015-02-27 NOTE — Progress Notes (Signed)
Occasional productive cough clear sputum since 02/21/2015

## 2015-02-28 ENCOUNTER — Encounter (HOSPITAL_COMMUNITY): Payer: Self-pay | Admitting: General Surgery

## 2015-02-28 DIAGNOSIS — K432 Incisional hernia without obstruction or gangrene: Secondary | ICD-10-CM | POA: Diagnosis not present

## 2015-02-28 MED ORDER — KETOROLAC TROMETHAMINE 30 MG/ML IJ SOLN
30.0000 mg | Freq: Once | INTRAMUSCULAR | Status: AC
  Administered 2015-02-28: 30 mg via INTRAVENOUS
  Filled 2015-02-28: qty 1

## 2015-02-28 MED ORDER — OXYCODONE HCL 5 MG PO TABS: 5.0000 mg | ORAL_TABLET | ORAL | Status: DC | PRN

## 2015-02-28 NOTE — Discharge Summary (Signed)
Physician Discharge Summary  Patient ID: Rachel Warren MRN: [id number] DOB/AGE: 08-20-1975 39 y.o.  Admit date: 02/27/2015 Discharge date: 02/28/2015  Admission Diagnoses:  Ventral Hernia  Discharge Diagnoses:  Principal Problem:   Ventral hernias s/p laparoscopic repair with mesh 02/27/15   Discharged Condition: good  Hospital Course: She underwent the above procedure and tolerated it well.  She was able to be discharged on POD # 1.  Discharge instructions were given to her.  Follow up in office in 2-3 weeks.   Discharge Exam: Blood pressure 152/75, pulse 61, temperature 99.3 F (37.4 C), temperature source Oral, resp. rate 18, height 5\' 8"  (1.727 m), weight 133.811 kg (295 lb), last menstrual period 02/01/2015, SpO2 100 %.   Disposition:      Medication List    STOP taking these medications        HYDROcodone-acetaminophen 10-325 MG per tablet  Commonly known as:  NORCO      TAKE these medications        meloxicam 15 MG tablet  Commonly known as:  MOBIC  Take 15 mg by mouth every morning.     multivitamin with minerals Tabs tablet  Take 1 tablet by mouth every morning.     naproxen sodium 220 MG tablet  Commonly known as:  ANAPROX  Take 440 mg by mouth 2 (two) times daily as needed (pain).     oxyCODONE 5 MG immediate release tablet  Commonly known as:  Oxy IR/ROXICODONE  Take 1-2 tablets (5-10 mg total) by mouth every 4 (four) hours as needed for moderate pain.     VITAMIN D PO  Take 1 tablet by mouth every morning.         Signed: Odis Hollingshead 02/28/2015, 7:42 AM

## 2015-02-28 NOTE — Progress Notes (Signed)
1 Day Post-Op  Subjective: Very sore.  Able to walk to bathroom and void ok.  No nausea.  Objective: Vital signs in last 24 hours: Temp:  [97.3 F (36.3 C)-99.3 F (37.4 C)] 99.3 F (37.4 C) (08/16 0533) Pulse Rate:  [57-80] 61 (08/16 0533) Resp:  [17-21] 18 (08/16 0533) BP: (115-155)/(47-83) 152/75 mmHg (08/16 0533) SpO2:  [94 %-100 %] 100 % (08/16 0533) Weight:  [133.811 kg (295 lb)] 133.811 kg (295 lb) (08/15 1015) Last BM Date: 02/26/15  Intake/Output from previous day: 08/15 0701 - 08/16 0700 In: 3495 [P.O.:460; I.V.:2985; IV Piggyback:50] Out: -  Intake/Output this shift:    PE: General- In NAD Abdomen-soft, dressings dry  Lab Results:  No results for input(s): WBC, HGB, HCT, PLT in the last 72 hours. BMET No results for input(s): NA, K, CL, CO2, GLUCOSE, BUN, CREATININE, CALCIUM in the last 72 hours. PT/INR No results for input(s): LABPROT, INR in the last 72 hours. Comprehensive Metabolic Panel:    Component Value Date/Time   NA 139 02/22/2015 0930   NA 139 10/04/2008 0643   K 3.8 02/22/2015 0930   K 3.5 10/04/2008 0643   CL 105 02/22/2015 0930   CL 105 10/04/2008 0643   CO2 27 02/22/2015 0930   CO2 24 10/04/2008 0640   BUN 10 02/22/2015 0930   BUN 7 10/04/2008 0643   CREATININE 0.69 02/22/2015 0930   CREATININE 0.9 10/04/2008 0643   GLUCOSE 97 02/22/2015 0930   GLUCOSE 93 10/04/2008 0643   CALCIUM 8.7* 02/22/2015 0930   CALCIUM 8.9 10/04/2008 0640   AST 22 02/22/2015 0930   AST 22 10/04/2008 0640   ALT 19 02/22/2015 0930   ALT 26 10/04/2008 0640   ALKPHOS 56 02/22/2015 0930   ALKPHOS 60 10/04/2008 0640   BILITOT 0.4 02/22/2015 0930   BILITOT 0.7 10/04/2008 0640   PROT 6.9 02/22/2015 0930   PROT 6.5 10/04/2008 0640   ALBUMIN 3.7 02/22/2015 0930   ALBUMIN 3.5 10/04/2008 0640     Studies/Results: No results found.  Anti-infectives: Anti-infectives    Start     Dose/Rate Route Frequency Ordered Stop   02/27/15 1600  ceFAZolin (ANCEF) IVPB  2 g/50 mL premix     2 g 100 mL/hr over 30 Minutes Intravenous 3 times per day 02/27/15 1050 02/27/15 1805   02/27/15 0600  ceFAZolin (ANCEF) 3 g in dextrose 5 % 50 mL IVPB     3 g 160 mL/hr over 30 Minutes Intravenous On call to O.R. 02/26/15 1332 02/27/15 0737      Assessment Principal Problem:   Ventral hernia s/p laparoscopic repair with mesh 02/27/15-stable overnight; meets criteria for discharge.      Plan: Discharge today.  Instructions given to her.   Rachel Warren 02/28/2015

## 2015-12-26 ENCOUNTER — Other Ambulatory Visit: Payer: Self-pay | Admitting: General Surgery

## 2015-12-26 DIAGNOSIS — D490 Neoplasm of unspecified behavior of digestive system: Secondary | ICD-10-CM

## 2016-01-22 ENCOUNTER — Ambulatory Visit
Admission: RE | Admit: 2016-01-22 | Discharge: 2016-01-22 | Disposition: A | Discharge status: Home / Self Care | Payer: BC Managed Care – PPO | Source: Ambulatory Visit | Attending: General Surgery | Admitting: General Surgery

## 2016-01-22 DIAGNOSIS — D490 Neoplasm of unspecified behavior of digestive system: Secondary | ICD-10-CM

## 2016-01-22 DIAGNOSIS — K760 Fatty (change of) liver, not elsewhere classified: Secondary | ICD-10-CM

## 2016-01-22 HISTORY — DX: Fatty (change of) liver, not elsewhere classified: K76.0

## 2016-01-22 MED ORDER — GADOBENATE DIMEGLUMINE 529 MG/ML IV SOLN
20.0000 mL | Freq: Once | INTRAVENOUS | Status: AC | PRN
  Administered 2016-01-22: 20 mL via INTRAVENOUS

## 2017-03-20 ENCOUNTER — Ambulatory Visit (INDEPENDENT_AMBULATORY_CARE_PROVIDER_SITE_OTHER): Payer: BC Managed Care – PPO | Admitting: Family Medicine

## 2017-03-20 ENCOUNTER — Encounter: Payer: Self-pay | Admitting: Family Medicine

## 2017-03-20 VITALS — BP 120/80 | HR 105 | Temp 98.7°F | Ht 68.0 in | Wt 261.8 lb

## 2017-03-20 DIAGNOSIS — M5416 Radiculopathy, lumbar region: Secondary | ICD-10-CM

## 2017-03-20 DIAGNOSIS — N6002 Solitary cyst of left breast: Secondary | ICD-10-CM

## 2017-03-20 DIAGNOSIS — F4321 Adjustment disorder with depressed mood: Secondary | ICD-10-CM

## 2017-03-20 DIAGNOSIS — Z7689 Persons encountering health services in other specified circumstances: Secondary | ICD-10-CM

## 2017-03-20 DIAGNOSIS — R634 Abnormal weight loss: Secondary | ICD-10-CM

## 2017-03-20 DIAGNOSIS — F4320 Adjustment disorder, unspecified: Secondary | ICD-10-CM | POA: Diagnosis not present

## 2017-03-20 NOTE — Patient Instructions (Addendum)
Breast Cyst A breast cyst is a sac in the breast that is filled with fluid. Breast cysts are usually noncancerous (benign). They are common among women, and they are most often located in the upper, outer portion of the breast. One or more cysts may develop. They form when fluid builds up inside of the breast glands. There are several types of breast cysts:  Macrocyst. This is a cyst that is about 2 inches (5.1 cm) across (in diameter).  Microcyst. This is a very small cyst that you cannot feel, but it can be seen with imaging tests such as an X-ray of the breast (mammogram) or ultrasound.  Galactocele. This is a cyst that contains milk. It may develop if you suddenly stop breastfeeding.  Breast cysts do not increase your risk of breast cancer. They usually disappear after menopause, unless you take artificial hormones (are on hormone therapy). What are the causes? The exact cause of breast cysts is not known. Possible causes include:  Blockage of tubes (ducts) in the breast glands, which leads to fluid buildup. Duct blockage may result from: ? Fibrocystic breast changes. This is a common, benign condition that occurs when women go through hormonal changes during the menstrual cycle. This is a common cause of multiple breast cysts. ? Overgrowth of breast tissue or breast glands. ? Scar tissue in the breast from previous surgery.  Changes in certain female hormones (estrogen and progesterone).  What increases the risk? You may be more likely to develop breast cysts if you have not gone through menopause. What are the signs or symptoms? Symptoms of a breast cyst may include:  Feeling one or more smooth, round, soft lumps (like grapes) in the breast that are easily moveable. The lump(s) may get bigger and more painful before your period and get smaller after your period.  Breast discomfort or pain.  How is this diagnosed? A cyst can be felt during a physical exam by your health care  provider. A mammogram and ultrasound will be done to confirm the diagnosis. Fluid may be removed from the cyst with a needle (fine-needle aspiration) and tested to make sure the cyst is not cancerous. How is this treated? Treatment may not be necessary. Your health care provider may monitor the cyst to see if it goes away on its own. If the cyst is uncomfortable or gets bigger, or if you do not like how the cyst makes your breast look, you may need treatment. Treatment may include:  Hormone treatment.  Fine-needle aspiration, to drain fluid from the cyst. There is a chance of the cyst coming back (recurring) after aspiration.  Surgery to remove the cyst.  Follow these instructions at home:  See your health care provider regularly. ? Get a yearly physical exam. ? If you are 20-40 years old, get a clinical breast exam every 1-3 years. After age 40, get this exam every year. ? Get mammograms as often as directed.  Do a breast self-exam every month, or as often as directed. Having many breast cysts, or "lumpy" breasts, may make it harder to feel for new lumps. Understand how your breasts normally look and feel, and write down any changes in your breasts so you can tell your health care provider about the changes. A breast self-exam involves: ? Comparing your breasts in the mirror. ? Looking for visible changes in your skin or nipples. ? Feeling for lumps or changes.  Take over-the-counter and prescription medicines only as told by your health care   provider.  Wear a supportive bra, especially when exercising.  Follow instructions from your health care provider about eating and drinking restrictions. ? Avoid caffeine. ? Cut down on salt (sodium) in what you eat and drink, especially before your menstrual period. Too much sodium can cause fluid buildup (retention), breast swelling, and discomfort.  Keep all follow-up visits as told your health care provider. This is important. Contact a  health care provider if:  You feel, or think you feel, a lump in your breast.  You notice that both breasts look or feel different than usual.  Your breast is still causing pain after your menstrual period is over.  You find new lumps or bumps that were not there before.  You feel lumps in your armpit (axilla). Get help right away if:  You have severe pain, tenderness, redness, or warmth in your breast.  You have fluid or blood leaking from your nipple.  Your breast lump becomes hard and painful.  You notice dimpling or wrinkling of the breast or nipple. This information is not intended to replace advice given to you by your health care provider. Make sure you discuss any questions you have with your health care provider. Document Released: 07/01/2005 Document Revised: 03/22/2016 Document Reviewed: 03/22/2016 Elsevier Interactive Patient Education  2017 [place] Breast self-awareness means:  Knowing how your breasts look.  Knowing how your breasts feel.  Checking your breasts every month for changes.  Telling your doctor if you notice a change in your breasts.  Breast self-awareness allows you to notice a breast problem early while it is still small. How to do a breast self-exam One way to learn what is normal for your breasts and to check for changes is to do a breast self-exam. To do a breast self-exam: Look for Changes  1. Take off all the clothes above your waist. 2. Stand in front of a mirror in a room with good lighting. 3. Put your hands on your hips. 4. Push your hands down. 5. Look at your breasts and nipples in the mirror to see if one breast or nipple looks different than the other. Check to see if: ? The shape of one breast is different. ? The size of one breast is different. ? There are wrinkles, dips, and bumps in one breast and not the other. 6. Look at each breast for changes in your skin, such as: ? Redness. ? Scaly  areas. 7. Look for changes in your nipples, such as: ? Liquid around the nipples. ? Bleeding. ? Dimpling. ? Redness. ? A change in where the nipples are. Feel for Changes 1. Lie on your back on the floor. 2. Feel each breast. To do this, follow these steps: ? Pick a breast to feel. ? Put the arm closest to that breast above your head. ? Use your other arm to feel the nipple area of your breast. Feel the area with the pads of your three middle fingers by making small circles with your fingers. For the first circle, press lightly. For the second circle, press harder. For the third circle, press even harder. ? Keep making circles with your fingers at the light, harder, and even harder pressures as you move down your breast. Stop when you feel your ribs. ? Move your fingers a little toward the center of your body. ? Start making circles with your fingers again, this time going up until you reach your collarbone. ? Keep making up and down  circles until you reach your armpit. Remember to keep using the three pressures. ? Feel the other breast in the same way. 3. Sit or stand in the shower or tub. 4. With soapy water on your skin, feel each breast the same way you did in step 2, when you were lying on the floor. Write Down What You Find  After doing the self-exam, write down:  What is normal for each breast.  Any changes you find in each breast.  When you last had your period.  How often should I check my breasts? Check your breasts every month. If you are breastfeeding, the best time to check them is after you feed your baby or after you use a breast pump. If you get periods, the best time to check your breasts is 5-7 days after your period is over. When should I see my doctor? See your doctor if you notice:  A change in shape or size of your breasts or nipples.  A change in the skin of your breast or nipples, such as red or scaly skin.  Unusual fluid coming from your nipples.  A  lump or thick area that was not there before.  Pain in your breasts.  Anything that concerns you.  This information is not intended to replace advice given to you by your health care provider. Make sure you discuss any questions you have with your health care provider. Document Released: 12/18/2007 Document Revised: 12/07/2015 Document Reviewed: 05/21/2015 Elsevier Interactive Patient Education  [employer].

## 2017-03-20 NOTE — Progress Notes (Signed)
Patient presents to clinic today to establish care.  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  Pt formerly seen at [facility] by Eloise Levels, but the office recently closed.   Breast concern: -pt noted lump between breasts a few days ago. -pt denies pain in area, nipple discharge, or skin changes -Pt has never had a mammogram -Still having menses q month.  LMP 02/22/17  HTN: -taking amlodipine-benazapril 10-20 mg -denies HA, blurred vision. -concerned if the dose needs to be adjusted.  Was suppose to f/u with pcp for this but office closed prior to appt.  Arthritis: -R hip: had imaging in the past. Has done PT and cortisone injections with little to no relief. -R shoulder: starting to feel like hip, nagging ache.  Pain noted when it is rainy outside.  L5 Radiculopathy: -Duration x 4 yrs. -Had MRI several yrs ago -Has tried PT x 3 sessions, but continued exercises at home.  Tried to go to the gym 3x/wk and water PT.   -Epidural injections x 3 cycles -now with numbness and tingling in R leg and foot.  Wt loss -pt expresses concern about recent wt loss -Has been working with a [employer] -Eating smaller portions and stopping when full -Unable to exercise 2/2 hip and back pain -Also grieving.  Father died 1 mo ago.  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  She has been married for 7 yrs, but has been with her husband for 22 yrs.  Pt has a 56 yo daughter, who attends [employer] and wants to become a physician.  Pt had daughter via SVD, no issues during pregnancy.  Pt has started smoking again.  She first started in her early 25s socially for a short time.  She recently started 2/2 to the loss of her father.  He died unexpectedly 2/2 PE.  Pt states 1 pk of cigarettes may last a month or two.  Pt states she  knows she needs to stop.  Pt may have an occassional drink.  FMHx: -Mom: desc when pt was young (5) from hepatitis -Dad: desc 2/2 PE 1 month ago. HTN, Heart valve issue -MGM: raised pt.  HTN, enlarged heart -MGF: DM, HTN -PGM: HTN, blood clots PGF: Prostate Ca in late 70s    Past Medical History:  Diagnosis Date  . Anemia   . Arthritis   . Back pain   . Pinched nerve    in back    Past Surgical History:  Procedure Laterality Date  . benign breast cyst removed    . CHOLECYSTECTOMY    . INSERTION OF MESH N/A 02/27/2015   Procedure: INSERTION OF MESH;  Surgeon: Jackolyn Confer, MD;  Location: [facility];  Service: General;  Laterality: N/A;  . LAPAROSCOPIC ASSISTED VENTRAL HERNIA REPAIR N/A 02/27/2015   Procedure: LAPAROSCOPIC VENTRAL HERNIA REPAIR WITH MESH;  Surgeon: Jackolyn Confer, MD;  Location: [facility];  Service: General;  Laterality: N/A;  . THYROIDECTOMY, PARTIAL      Current Outpatient Prescriptions on File Prior to Visit  Medication Sig Dispense Refill  . meloxicam (MOBIC) 15 MG tablet Take 15 mg by mouth every morning.     No current facility-administered medications on file prior to visit.     No Known Allergies  No family history on file.  Social History  Social History  . Marital status: Single    Spouse name: N/A  . Number of children: N/A  . Years of education: N/A   Occupational History  . Not on file.   Social History Main Topics  . Smoking status: Light Tobacco Smoker    Types: Cigarettes  . Smokeless tobacco: Never Used  . Alcohol use Yes     Comment: occasionally  . Drug use: Yes    Types: Marijuana  . Sexual activity: Not on file   Other Topics Concern  . Not on file   Social History Narrative  . No narrative on file    ROS  General: Denies fever, chills, night sweats, changes in weight, changes in appetite HEENT: Denies headaches, ear pain, changes in vision, rhinorrhea, sore throat CV: Denies CP, palpitations, SOB,  orthopnea Pulm: Denies SOB, cough, wheezing GI: Denies abdominal pain, nausea, vomiting, diarrhea, constipation GU: Denies dysuria, hematuria, frequency, vaginal discharge Msk: Denies muscle cramps   +low back, R hip and shoulder pain. Neuro: Denies weakness, numbness, tingling Skin: Denies rashes, bruising  +lump between breast. Psych: Denies depression, anxiety, hallucinations   BP 120/80 (BP Location: Left Arm, Patient Position: Sitting, Cuff Size: Large)   Pulse (!) 105   Temp 98.7 F (37.1 C) (Oral)   Ht 5\' 8"  (1.727 m)   Wt 261 lb 12.8 oz (118.8 kg)   LMP 02/22/2017 (Approximate) Comment: normal  SpO2 98%   BMI 39.81 kg/m   Physical Exam  Gen. Pleasant, well developed, well-nourished, in NAD. HEENT - [place]/AT, PERRL, EOMI, conjunctive clear, no scleral icterus, no nasal drainage, pharynx without erythema or exudate. Neck: No JVD, no thyromegaly, no carotid bruits Lungs: no use of accessory muscles, CTAB, no wheezes, rales or rhonchi Cardiovascular: RRR, No r/g/m, no peripheral edema Abdomen: BS present, soft, nontender,nondistended, no hepatosplenomegaly Musculoskeletal: R ankle deformity- lateral side edematous 2/2 h/o ankle fx, moves all four extremities.  R hip pain with log roll, FABER and FADIR.  TTP of Lumbar spine. Neuro:  A&Ox3, CN II-XII intact, ambulates with a limp favoring R hip. Skin:  Warm, dry, intact, hypopigmented lesions on forearms b/l. Psych: normal affect, mood appropriate.  Tearful when speaking about her father. Breast:  Large pendulous, no skin changes noted, no nipple d/c. 1 cm round nodule-cyst noted in medial L breast.  No masses noted in between breast.  No axillary lymphadenopathy noted.   No results found for this or any previous visit (from the past 2160 hour(s)).  Assessment/Plan: Benign cyst of left breast in female -likely benign cyst in L medial upper breast.  Area of initial pt concern in between breast at lower sternum appears to be  attachment of rib to sternum and skin fold. -Pt to monitor at home until after menses. -Pt to schedule mammogram.  Given info.  Lumbar radiculopathy, chronic  -has tried PT and epidurals.  Does not want chronic narcotics - Plan: MR Lumbar Spine W Wo Contrast, Basic metabolic panel -Ambulatory referral to Neurosurgery  Weight loss -likely 2/2 improved eating and grief -will continue to monitor -f/u in 1 mo  Grief, normal -recent loss of father unexpectedly -offered counseling service, pt declined. -Has a good support system  Encounter to establish care with new doctor -obtain outside records.

## 2017-03-21 LAB — BASIC METABOLIC PANEL
BUN: 11 mg/dL (ref 6–23)
CALCIUM: 9 mg/dL (ref 8.4–10.5)
CO2: 28 mEq/L (ref 19–32)
CREATININE: 0.83 mg/dL (ref 0.40–1.20)
Chloride: 105 mEq/L (ref 96–112)
GFR: 96.73 mL/min (ref >60.00)
Glucose, Bld: 89 mg/dL (ref 70–99)
POTASSIUM: 3.4 meq/L — AB (ref 3.5–5.1)
Sodium: 139 mEq/L (ref 135–145)

## 2017-03-24 ENCOUNTER — Other Ambulatory Visit: Payer: Self-pay | Admitting: Family Medicine

## 2017-03-24 DIAGNOSIS — E876 Hypokalemia: Secondary | ICD-10-CM

## 2017-03-24 MED ORDER — POTASSIUM CHLORIDE CRYS ER 20 MEQ PO TBCR: 20.0000 meq | EXTENDED_RELEASE_TABLET | Freq: Every day | ORAL | 0 refills | Status: DC

## 2017-04-01 ENCOUNTER — Telehealth: Payer: Self-pay

## 2017-04-01 ENCOUNTER — Other Ambulatory Visit: Payer: Self-pay | Admitting: Family Medicine

## 2017-04-01 MED ORDER — CAPSAICIN 0.075 % EX CREA
1.0000 | TOPICAL_CREAM | Freq: Two times a day (BID) | CUTANEOUS | 0 refills | Status: DC
Start: 2017-04-01 — End: 2017-09-12

## 2017-04-01 NOTE — Telephone Encounter (Signed)
Patient has not been contact by [facility] or [facility] for appts. Gave pt those phone numbers for her to follow up. She is also stating that her pain has increased significantly and she would like to have "something for the pain so that she can function".   Dr. Volanda Napoleon - Please advise. Thanks!

## 2017-04-01 NOTE — Progress Notes (Signed)
Unsure as to why the delay in scheduling of MRI and Neurosurg appointment, but referrals placed.  Have not yet received any records, so unsure of what pt has tried in the past for pain.  Will send in rx for capsicum cream.    If pt willing, will also increase dose of cymbalta and provide with pain management referral.

## 2017-04-01 NOTE — Telephone Encounter (Signed)
Pt advised. She will adjust medications and let us know if she would like referral to pain mngmt. Nothing further needed.

## 2017-04-10 ENCOUNTER — Telehealth: Payer: Self-pay | Admitting: Family Medicine

## 2017-04-10 NOTE — Telephone Encounter (Signed)
Please advise regarding MRI. If you would like I can send in refill for Cymbalta.

## 2017-04-10 NOTE — Telephone Encounter (Signed)
Pt state that she need something for the MRI that is scheduled for 04/13/17 pt state that the facility that is doing the MRI does not administer anything for claustrophobia and pain.    Pt state that she will have completed her last CYMBALTA on tomorrow and would like to have the new dosage for tomorrow afternoon.  Pharm:  [facility]

## 2017-04-11 ENCOUNTER — Other Ambulatory Visit: Payer: Self-pay | Admitting: Family Medicine

## 2017-04-11 MED ORDER — DIAZEPAM 2 MG PO TABS: 2.0000 mg | ORAL_TABLET | Freq: Once | ORAL | 0 refills | Status: AC

## 2017-04-11 MED ORDER — DULOXETINE HCL 20 MG PO CPEP: 20.0000 mg | ORAL_CAPSULE | Freq: Two times a day (BID) | ORAL | 5 refills | Status: DC

## 2017-04-11 NOTE — Telephone Encounter (Signed)
Spoke with patient regarding Dr. Volanda Napoleon sending in a refill for Cymbalta and Calling in rx for diazepam 2mg  for MRI. Explain to patient to have someone take her to and from appointment while on medication. Patient understood and medication has been called in

## 2017-04-13 ENCOUNTER — Ambulatory Visit
Admission: RE | Admit: 2017-04-13 | Discharge: 2017-04-13 | Disposition: A | Discharge status: Home / Self Care | Payer: BC Managed Care – PPO | Source: Ambulatory Visit | Attending: Family Medicine | Admitting: Family Medicine

## 2017-04-13 DIAGNOSIS — M5416 Radiculopathy, lumbar region: Secondary | ICD-10-CM

## 2017-04-13 MED ORDER — GADOBENATE DIMEGLUMINE 529 MG/ML IV SOLN
20.0000 mL | Freq: Once | INTRAVENOUS | Status: AC | PRN
  Administered 2017-04-13: 20 mL via INTRAVENOUS

## 2017-05-19 ENCOUNTER — Telehealth: Payer: Self-pay | Admitting: Family Medicine

## 2017-05-19 NOTE — Telephone Encounter (Signed)
Pt needs new rx amlodipine-benazepril 10-20 mg #30 w/refills sent to [facility], pt also would like to know can she double up on cymbalta. Pt is still having pain

## 2017-05-21 ENCOUNTER — Other Ambulatory Visit: Payer: Self-pay | Admitting: Emergency Medicine

## 2017-05-21 MED ORDER — AMLODIPINE BESY-BENAZEPRIL HCL 10-20 MG PO CAPS: 1.0000 | ORAL_CAPSULE | Freq: Every day | ORAL | 1 refills | Status: DC

## 2017-05-21 MED ORDER — DULOXETINE HCL 30 MG PO CPEP: 30.0000 mg | ORAL_CAPSULE | Freq: Two times a day (BID) | ORAL | 3 refills | Status: DC

## 2017-05-21 NOTE — Telephone Encounter (Signed)
Please advise 

## 2017-05-21 NOTE — Telephone Encounter (Signed)
Spoke with patient about refilling medication and sending a new rx for the cymbalta 30mg  BID. Advised patient to make sure she gives the office a call before she runs out of medication.

## 2017-05-21 NOTE — Telephone Encounter (Signed)
Pt states she has not had bp med since last Thursday. Would like to know if she can get.

## 2017-05-21 NOTE — Telephone Encounter (Signed)
Please advise pt not to wait until she is out of her meds to call about a refill.  Please refill the bp meds.  The cymbalta can be increased to 30 mg BID.

## 2017-07-21 ENCOUNTER — Telehealth: Payer: Self-pay | Admitting: Family Medicine

## 2017-07-21 NOTE — Telephone Encounter (Signed)
Copied from [place]. Topic: Quick Communication - See Telephone Encounter >> Jul 21, 2017  9:05 AM Oneta Rack wrote: CRM for notification. See Telephone encounter for:   07/21/17.  Relation to TC:YELY Call back number: [phone] Pharmacy: [place], [place] - [address] [phone] (Phone) [phone] (Fax)    Reason for call:  Patient would like to discuss DULoxetine (CYMBALTA) 30 MG frequency stating pharmacy advised patient should have a few pills left over, patient requesting meloxicam (MOBIC) 15 MG tablet refill. Please advise

## 2017-07-21 NOTE — Telephone Encounter (Signed)
Spoke with patient regarding phone note patient states that she has been taking two tablets by mouth daily. Patient states that she is running out of the Cymbalta and needs a refill but when she called the pharmacy someone stated that she is too early for an refill. Took a look at patient chart and patient would not be due for a refill until next month. Tried to contact pharmacy numerous of times today and was constantly on hold for over 35 mins. Called customer service at [facility] and asked was the pharmacy open and they said yes and transferred me again to the pharmacy where nobody answer. Will try to contact pharmacy again.

## 2017-07-21 NOTE — Telephone Encounter (Signed)
Left the patient a VM stating that I was not able to reach the pharmacy but I will try to give the a call in the am.

## 2017-07-21 NOTE — Telephone Encounter (Signed)
Not our patient. Routed to correct office.

## 2017-07-22 ENCOUNTER — Other Ambulatory Visit: Payer: Self-pay | Admitting: Emergency Medicine

## 2017-07-22 MED ORDER — DULOXETINE HCL 30 MG PO CPEP: 30.0000 mg | ORAL_CAPSULE | Freq: Two times a day (BID) | ORAL | 2 refills | Status: DC

## 2017-07-22 MED ORDER — MELOXICAM 15 MG PO TABS: 15.0000 mg | ORAL_TABLET | Freq: Every morning | ORAL | 0 refills | Status: DC

## 2017-07-22 NOTE — Telephone Encounter (Signed)
Spoke with Tavion from [employer] and he states that he can get the patient medication ready. I explained what happened with this patient medication quantity being off on her last prescription according to the patient and that if we send in a refill she would be charged full price. Tavion stated that when he gets the prescription ready the patient would only be charge 5 dollars in stead of 18 dollars and that they will make sure the quantity matches what we send in.

## 2017-07-22 NOTE — Telephone Encounter (Signed)
Medications have been sent to patient pharmacy nothing further needed.

## 2017-08-05 ENCOUNTER — Ambulatory Visit: Payer: BC Managed Care – PPO | Admitting: Family Medicine

## 2017-08-12 ENCOUNTER — Ambulatory Visit (INDEPENDENT_AMBULATORY_CARE_PROVIDER_SITE_OTHER): Payer: BC Managed Care – PPO | Admitting: Family Medicine

## 2017-08-12 ENCOUNTER — Encounter: Payer: Self-pay | Admitting: Family Medicine

## 2017-08-12 VITALS — BP 130/80 | HR 98 | Temp 97.6°F | Wt 256.0 lb

## 2017-08-12 DIAGNOSIS — G8929 Other chronic pain: Secondary | ICD-10-CM | POA: Diagnosis not present

## 2017-08-12 DIAGNOSIS — M5441 Lumbago with sciatica, right side: Secondary | ICD-10-CM | POA: Diagnosis not present

## 2017-08-12 DIAGNOSIS — N938 Other specified abnormal uterine and vaginal bleeding: Secondary | ICD-10-CM | POA: Diagnosis not present

## 2017-08-12 MED ORDER — MEGESTROL ACETATE 40 MG PO TABS: 40.0000 mg | ORAL_TABLET | Freq: Two times a day (BID) | ORAL | 0 refills | Status: AC

## 2017-08-12 MED ORDER — IBUPROFEN 800 MG PO TABS: 800.0000 mg | ORAL_TABLET | Freq: Three times a day (TID) | ORAL | 1 refills | Status: DC | PRN

## 2017-08-12 MED ORDER — CYCLOBENZAPRINE HCL 10 MG PO TABS: 10.0000 mg | ORAL_TABLET | Freq: Every evening | ORAL | 0 refills | Status: DC | PRN

## 2017-08-12 NOTE — Patient Instructions (Addendum)
Abnormal Uterine Bleeding Abnormal uterine bleeding can affect women at various stages in life, including teenagers, women in their reproductive years, pregnant women, and women who have reached menopause. Several kinds of uterine bleeding are considered abnormal, including:  Bleeding or spotting between periods.  Bleeding after sexual intercourse.  Bleeding that is heavier or more than normal.  Periods that last longer than usual.  Bleeding after menopause. Many cases of abnormal uterine bleeding are minor and simple to treat, while others are more serious. Any type of abnormal bleeding should be evaluated by your health care provider. Treatment will depend on the cause of the bleeding. Follow these instructions at home: Monitor your condition for any changes. The following actions may help to alleviate any discomfort you are experiencing:  Avoid the use of tampons and douches as directed by your health care provider.  Change your pads frequently. You should get regular pelvic exams and Pap tests. Keep all follow-up appointments for diagnostic tests as directed by your health care provider. Contact a health care provider if:  Your bleeding lasts more than 1 week.  You feel dizzy at times. Get help right away if:  You pass out.  You are changing pads every 15 to 30 minutes.  You have abdominal pain.  You have a fever.  You become sweaty or weak.  You are passing large blood clots from the vagina.  You start to feel nauseous and vomit. This information is not intended to replace advice given to you by your health care provider. Make sure you discuss any questions you have with your health care provider. Document Released: 07/01/2005 Document Revised: 12/13/2015 Document Reviewed: 01/28/2013 Elsevier Interactive Patient Education  2017 Elsevier Inc.  

## 2017-08-12 NOTE — Progress Notes (Signed)
Subjective:    Patient ID: Rachel Warren, female    DOB: 12-Mar-1976, 42 y.o.   MRN: [id number]  No chief complaint on file.   HPI Patient was seen today for acute issue.  Patient was initially scheduled for Pap however endorses abnormal uterine bleeding times 12 days.  Patient endorses a history of this in the remote past.  Patient states last episode occurred when she was 1.  Since then her menses have been regular especially after the birth of her daughter.  Pt also endorses issues with hormonal birth control from hair falling out, wt gain, to name a few.   For this current episode, menses started on 1/16 with spotting.  Pt endorses lightheadedness, abdominal cramping, a painful twisting feeling in her abdomen that brought her to her knees, and golf ball sized blood clots.  Patient states she is wearing 6-8 pads per day.  Patient has not tried anything for the pain.  Of note patient recently received an epidural injection on 1/18 for her ongoing back pain.  Patient was not sure if the injection caused her to have the issues with her menses.  Patient also endorses being unable to sleep secondary to muscle spasms in her back.  Past Medical History:  Diagnosis Date  . Anemia   . Arthritis   . Back pain   . Pinched nerve    in back    No Known Allergies  ROS General: Denies fever, chills, night sweats, changes in weight, changes in appetite HEENT: Denies headaches, ear pain, changes in vision, rhinorrhea, sore throat CV: Denies CP, palpitations, SOB, orthopnea Pulm: Denies SOB, cough, wheezing GI: Denies abdominal pain, nausea, vomiting, diarrhea, constipation + abdominal pain, cramping, and KUB GU: Denies dysuria, hematuria, frequency, vaginal discharge  + history of fibroids Msk: Denies muscle cramps, joint pains  +back pain Neuro: Denies weakness, numbness, tingling Skin: Denies rashes, bruising Psych: Denies depression, anxiety, hallucinations     Objective:    Blood  pressure 130/80, pulse 98, temperature 97.6 F (36.4 C), temperature source Oral, weight 256 lb (116.1 kg).   Gen. Pleasant, well-nourished, in no distress, normal affect   HEENT: [place]/AT, face symmetric, no scleral icterus, PERRLA, nares patent without drainage, pharynx without erythema or exudate. Lungs: no accessory muscle use, CTAB, no wheezes or rales Cardiovascular: RRR, no m/r/g, no peripheral edema Abdomen: BS present, soft, NT/ND, no hepatosplenomegaly, no masses appreciated Musculoskeletal: No deformities, no cyanosis or clubbing, normal tone Neuro:  A&Ox3, CN II-XII intact, normal gait   Wt Readings from Last 3 Encounters:  08/12/17 256 lb (116.1 kg)  03/20/17 261 lb 12.8 oz (118.8 kg)  02/27/15 295 lb (133.8 kg)    Lab Results  Component Value Date   WBC 5.6 02/22/2015   HGB 10.0 (L) 02/22/2015   HCT 33.0 (L) 02/22/2015   PLT 217 02/22/2015   GLUCOSE 89 03/20/2017   ALT 19 02/22/2015   AST 22 02/22/2015   NA 139 03/20/2017   K 3.4 (L) 03/20/2017   CL 105 03/20/2017   CREATININE 0.83 03/20/2017   BUN 11 03/20/2017   CO2 28 03/20/2017   INR 1.00 02/22/2015    Assessment/Plan:  DUB (dysfunctional uterine bleeding)  -will obtain labs -concern for fibroids increasing in size or in number, etc. - Plan: Ambulatory referral to Obstetrics / Gynecology, CBC with Differential/Platelet, ibuprofen (ADVIL,MOTRIN) 800 MG tablet, US Transvaginal Non-OB, megestrol (MEGACE) 40 MG tablet, CBC with Differential/Platelet ----Update: labs reviewed.  Hgb 9.0 (was 10 on  02/22/15.  Has been as high as 14 in 2010)  Will send rx in for Ferrous sulfate 325 mg.  Pt can take at least one per day.  If able to tolerate can take BID.  Of note may cause constipation and dark colored stools.  Chronic bilateral low back pain with right-sided sciatica  -flexeril at night prn  -will sent pt to sports med for shoe inserts given leg length discrepancy per PT. - Plan: cyclobenzaprine (FLEXERIL) 10 MG  tablet, Ambulatory referral to Sports Medicine  Follow-up in the next few weeks PRN.  Grier Mitts, MD

## 2017-08-13 ENCOUNTER — Encounter: Payer: Self-pay | Admitting: Family Medicine

## 2017-08-13 LAB — CBC WITH DIFFERENTIAL/PLATELET
BASOS ABS: 0.1 10*3/uL (ref 0.0–0.1)
Basophils Relative: 1.2 % (ref 0.0–3.0)
EOS ABS: 0 10*3/uL (ref 0.0–0.7)
Eosinophils Relative: 0.1 % (ref 0.0–5.0)
HCT: 30.2 % — ABNORMAL LOW (ref 36.0–46.0)
Hemoglobin: 9 g/dL — ABNORMAL LOW (ref 12.0–15.0)
LYMPHS ABS: 1.9 10*3/uL (ref 0.7–4.0)
Lymphocytes Relative: 23.1 % (ref 12.0–46.0)
MCHC: 29.8 g/dL — ABNORMAL LOW (ref 30.0–36.0)
MCV: 66.3 fl — ABNORMAL LOW (ref 78.0–100.0)
MONO ABS: 0.5 10*3/uL (ref 0.1–1.0)
Monocytes Relative: 6.5 % (ref 3.0–12.0)
NEUTROS ABS: 5.7 10*3/uL (ref 1.4–7.7)
NEUTROS PCT: 69.1 % (ref 43.0–77.0)
PLATELETS: 408 10*3/uL — AB (ref 150.0–400.0)
RBC: 4.55 Mil/uL (ref 3.87–5.11)
RDW: 19.5 % — AB (ref 11.5–15.5)
WBC: 8.2 10*3/uL (ref 4.0–10.5)

## 2017-08-13 MED ORDER — FERROUS SULFATE 325 (65 FE) MG PO TABS: 325.0000 mg | ORAL_TABLET | Freq: Every day | ORAL | 3 refills | Status: DC

## 2017-08-19 ENCOUNTER — Encounter: Payer: Self-pay | Admitting: Sports Medicine

## 2017-08-19 ENCOUNTER — Ambulatory Visit (INDEPENDENT_AMBULATORY_CARE_PROVIDER_SITE_OTHER): Payer: BC Managed Care – PPO | Admitting: Sports Medicine

## 2017-08-19 DIAGNOSIS — M549 Dorsalgia, unspecified: Secondary | ICD-10-CM | POA: Insufficient documentation

## 2017-08-19 DIAGNOSIS — G8929 Other chronic pain: Secondary | ICD-10-CM

## 2017-08-19 DIAGNOSIS — R269 Unspecified abnormalities of gait and mobility: Secondary | ICD-10-CM | POA: Diagnosis not present

## 2017-08-19 DIAGNOSIS — Z6837 Body mass index (BMI) 37.0-37.9, adult: Secondary | ICD-10-CM

## 2017-08-19 DIAGNOSIS — K439 Ventral hernia without obstruction or gangrene: Secondary | ICD-10-CM | POA: Diagnosis not present

## 2017-08-19 DIAGNOSIS — E6609 Other obesity due to excess calories: Secondary | ICD-10-CM | POA: Diagnosis not present

## 2017-08-19 DIAGNOSIS — M5441 Lumbago with sciatica, right side: Secondary | ICD-10-CM | POA: Diagnosis not present

## 2017-08-19 DIAGNOSIS — M1611 Unilateral primary osteoarthritis, right hip: Secondary | ICD-10-CM

## 2017-08-19 DIAGNOSIS — M169 Osteoarthritis of hip, unspecified: Secondary | ICD-10-CM | POA: Insufficient documentation

## 2017-08-19 DIAGNOSIS — E669 Obesity, unspecified: Secondary | ICD-10-CM | POA: Insufficient documentation

## 2017-08-19 NOTE — Assessment & Plan Note (Signed)
Her pain is significant limiting her ability to participate in meaningful exercise.  Given the significant disability that she has further intervention is something that she is interested in pursuing if therapeutically helpful.   Her weight does appear to be stable at this time and if any need for surgical intervention her BMI is at the upper limit of the acceptable range.   Wt Readings from Last 3 Encounters:  08/19/17 259 lb 9.6 oz (117.8 kg)  08/12/17 256 lb (116.1 kg)  03/20/17 261 lb 12.8 oz (118.8 kg)

## 2017-08-19 NOTE — Assessment & Plan Note (Signed)
This has complicated her recovery with her back does not entirely explain the right leg weakness although inhibition of the abdominal musculature following surgery may have absolutely contributed to her discomfort.

## 2017-08-19 NOTE — Assessment & Plan Note (Signed)
Patient does have a slight functional leg length discrepancy that does correct with sitting.  Suspect this is coming more from SI joint dysfunction but did place her into 1/4 inch heel lift as well as small longitudinal metatarsal arch support given the significant pes planus that she has.

## 2017-08-19 NOTE — Assessment & Plan Note (Signed)
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  I would like to still pursue nerve conduction studies initially but if these are unrevealing diagnostic and therapeutic right hip injection can be pursued at follow-up.   Plain film x-rays of her hip will be obtained prior to her next office visit.

## 2017-08-19 NOTE — Assessment & Plan Note (Signed)
Is quite a comp gated case.  She has some mild changes on her MRI that do not completely correlate with her symptoms or weakness.  I have concern for potential neurogenic etiology however given the significant weakness that she has with resisted straight leg raise.  Given the significant difficulty with gait further evaluation with a nerve conduction/EMG studies to rule out significant neurologic component will be ordered.  If any lack of improvement further diagnostic evaluation of her hips will be pursued.

## 2017-08-19 NOTE — Progress Notes (Signed)
Rachel Warren. Rachel Warren, Rachel Warren  Rachel Warren - 42 y.o. female MRN [id number]  Date of birth: 07/03/1976  Visit Date: 08/19/2017  PCP: Billie Ruddy, MD   Referred by: Billie Ruddy, MD   Scribe for today's visit: Wendy Poet, LAT, ATC     SUBJECTIVE:  Rachel Warren is here for New Patient (Initial Visit) (LBP w/ R LE sciatica) .  Referred by: Grier Mitts, MD Her LBP symptoms INITIALLY: Began 4 years ago w/ no MOI Described as 4/10 throbbing pain in her R knee and soreness in her R thigh , radiating to R LE from hip to toes Worsened with ADLs  Improved with epidurals and rest Additional associated symptoms include: N/T into the R LE but no change in bowel or bladder habits    At this time symptoms are worsening compared to onset w/ increased intensity and frequency of pain. She has been taking Flexeril, has gotten a few epidural injections.  She has gotten a lumbar spine MRI in fall 2018 that indicated loss of disc space between L4 and 5.   ROS Denies night time disturbances. Denies fevers, chills, or night sweats. Denies unexplained weight loss. Denies personal history of cancer. Denies changes in bowel or bladder habits. Denies recent unreported falls. Denies new or worsening dyspnea or wheezing. Reports headaches or dizziness. Intermittent dizziness. Reports numbness, tingling or weakness  In the extremities - in the R LE Denies dizziness or presyncopal episodes Reports lower extremity edema in the R LE.   HISTORY & PERTINENT PRIOR DATA:  Prior History reviewed and updated per electronic medical record.  Significant history, findings, studies and interim changes include:  reports that she has been smoking cigarettes.  she has never used smokeless tobacco. No results for input(s): HGBA1C, LABURIC, CREATINE in the last 8760 hours. No specialty comments available. Problem  Back  Pain   Has previously been seen by Dr. Annette Stable @ [facility].  No surgery recommended.  MRI on 04/14/2017: L5-S1 right paracentral disc protrusion with S1 impingement.  Moderate facet arthropathy diffusely most notably at L4-5.  Overall well-maintained paraspinal muscle tone with only minimal fat atrophy.    Osteoarthritis of Hip   CT scan 12/26/2014: Significant OA of the right hip with significant subchondral cysts and loss of joint space.  Osteoarthritis hip right hip osteoarthritis   Abnormal Gait   Marked weakness with hip flexion on the right with significant favoring of bilateral hips and knees.   Obesity  Ventral hernia s/p laparoscopic repair with mesh 02/27/15    OBJECTIVE:  VS:  HT:5\' 10"  (177.8 cm)   WT:259 lb 9.6 oz (117.8 kg)  BMI:37.25    BP:122/86  HR:94bpm  TEMP: ( )  RESP:97 %   PHYSICAL EXAM: Constitutional: WDWN, Non-toxic appearing. Psychiatric: Alert & appropriately interactive.  Not depressed or anxious appearing. Respiratory: No increased work of breathing.  Trachea Midline Eyes: Pupils are equal.  EOM intact without nystagmus.  No scleral icterus  NEUROVASCULAR exam: No clubbing or cyanosis appreciated No significant venous stasis changes Capillary Refill: normal, less than 2 seconds   LOWER Extremities  Pre-tibial edema: No significant pretibial edema Pedal Pulses: Normal & symmetrically palpable Dermatomes intact to light touch Normal strength in all myotomes    Marked antalgic gait with significant Trendelenburg type gait.  She has difficulties with straight leg raise bilaterally and marked weakness with hip flexion on the right greater  than left.   She is actually unable to completely hold her leg up off of the table.  She has dynamic genu valgus.  Leg length discrepancy while laying supine is approximately 1.5 cm but completely corrects with sitting.  Mild pain with straight leg raise but this is minimal.  Lower extremity reflexes are 2+/4  bilaterally and are symmetric.  No additional findings.   ASSESSMENT & PLAN:   1. Chronic bilateral low back pain with right-sided sciatica   2. Ventral hernia without obstruction or gangrene   3. Primary osteoarthritis of right hip   4. Abnormal gait   5. Class 2 obesity due to excess calories without serious comorbidity with body mass index (BMI) of 37.0 to 37.9 in adult    PLAN:    Ventral hernia s/p laparoscopic repair with mesh 3/61/44 This has complicated her recovery with her back does not entirely explain the right leg weakness although inhibition of the abdominal musculature following surgery may have absolutely contributed to her discomfort.  Back pain Is quite a comp gated case.  She has some mild changes on her MRI that do not completely correlate with her symptoms or weakness.  I have concern for potential neurogenic etiology however given the significant weakness that she has with resisted straight leg raise.  Given the significant difficulty with gait further evaluation with a nerve conduction/EMG studies to rule out significant neurologic component will be ordered.  If any lack of improvement further diagnostic evaluation of her hips will be pursued.  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  I would like to still pursue nerve conduction studies initially but if these are unrevealing diagnostic and therapeutic right hip injection can be pursued at follow-up.   Plain film x-rays of her hip will be obtained prior to her next office visit.  Abnormal gait Patient does have a slight functional leg length discrepancy that does correct with sitting.  Suspect this is coming  more from SI joint dysfunction but did place her into 1/4 inch heel lift as well as small longitudinal metatarsal arch support given the significant pes planus that she has.  Obesity  Her pain is significant limiting her ability to participate in meaningful exercise.  Given the significant disability that she has further intervention is something that she is interested in pursuing if therapeutically helpful.   Her weight does appear to be stable at this time and if any need for surgical intervention her BMI is at the upper limit of the acceptable range.   Wt Readings from Last 3 Encounters:  08/19/17 259 lb 9.6 oz (117.8 kg)  08/12/17 256 lb (116.1 kg)  03/20/17 261 lb 12.8 oz (118.8 kg)      ++++++++++++++++++++++++++++++++++++++++++++ Orders & Meds: No orders of the defined types were placed in this encounter.   No orders of the defined types were placed in this encounter.   ++++++++++++++++++++++++++++++++++++++++++++ Follow-up: Return for results of NCV test.   Pertinent documentation may be included in additional procedure notes, imaging studies, problem based documentation and patient instructions. Please see these sections of the encounter for additional information regarding this visit. CMA/ATC served as Education administrator during this visit. History, Physical, and Plan performed by medical  provider. Documentation and orders reviewed and attested to.      Gerda Diss, [facility] Physician

## 2017-08-20 ENCOUNTER — Encounter: Payer: Self-pay | Admitting: Neurology

## 2017-08-20 ENCOUNTER — Other Ambulatory Visit: Payer: Self-pay | Admitting: *Deleted

## 2017-08-20 DIAGNOSIS — R29898 Other symptoms and signs involving the musculoskeletal system: Secondary | ICD-10-CM

## 2017-09-07 ENCOUNTER — Other Ambulatory Visit: Payer: Self-pay | Admitting: Family Medicine

## 2017-09-09 ENCOUNTER — Ambulatory Visit (INDEPENDENT_AMBULATORY_CARE_PROVIDER_SITE_OTHER): Payer: BC Managed Care – PPO | Admitting: Neurology

## 2017-09-09 DIAGNOSIS — R29898 Other symptoms and signs involving the musculoskeletal system: Secondary | ICD-10-CM | POA: Diagnosis not present

## 2017-09-09 DIAGNOSIS — M5417 Radiculopathy, lumbosacral region: Secondary | ICD-10-CM

## 2017-09-09 NOTE — Procedures (Signed)
[facility]  [place], [address]  [place], [place] [address] Tel: [phone] Fax:  [phone] Test Date:  09/09/2017  Patient: Rachel Warren DOB: 1976-05-15 Physician: Narda Amber, DO  Sex: Female Height: 5\' 10"  Ref Phys: Teresa Coombs, DO  ID#: [id number] Temp: 35.1C Technician:    Patient Complaints: This is a 42 year old female referred for evaluation of low back pain and right leg weakness.  NCV & EMG Findings: Extensive electrodiagnostic testing of the right lower extremity and additional studies of the left shows:  1. Right sural and superficial peroneal sensory responses are normal. 2. Right tibial motor response shows reduced amplitude (7.35mV). Right peroneal and left tibial motor responses are within normal limits. Femoral motor was technically challenging due to body habitus and unable to be obtained bilaterally.  3. Right tibial H reflex study is within normal limits.  4. Chronic motor axon loss changes are seen affecting the right medial gastroc and biceps femoris short head muscles, without accompanied active denervation. These findings are not present in the left lower extremity.   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   ___________________________ Narda Amber, DO    Nerve Conduction Studies Anti Sensory Summary Table   Site NR Peak (ms) Norm Peak (ms) P-T Amp (V) Norm P-T Amp  Right Sup Peroneal Anti Sensory (Ant Lat Mall)  35.1C  12 cm    2.8 <4.5 8.8 >5  Right Sural Anti Sensory (Lat Mall)  35.1C  Calf    3.0 <4.5 14.6 >5   Motor Summary Table   Site NR Onset (ms) Norm Onset (ms) O-P Amp (mV) Norm O-P Amp Site1 Site2 Delta-0 (ms) Dist (cm) Vel (m/s) Norm Vel (m/s)  Left Femoral Motor (Vastus Med)  35.1C    body habitus    Abv Ing Lig NR  <6.5  >4        Right Femoral Motor (Vastus Med)  35.1C    body habitus    Abv Ing Lig NR  <6.5  >4        Right Peroneal Motor  (Ext Dig Brev)  35.1C  Ankle    2.7 <5.5 6.7 >3 B Fib Ankle 7.7 41.0 53 >40  B Fib    10.4  6.3  Poplt B Fib 1.2 10.0 83 >40  Poplt    11.6  6.2         Right Peroneal TA Motor (Tib Ant)  35.1C  Fib Head    2.8 <4.0 4.9 >4 Poplit Fib Head 1.8 9.0 50 >40  Poplit    4.6  4.1         Left Tibial Motor (Abd Hall Brev)  35.1C  Ankle    4.1 <6.0 9.1 >8 Knee Ankle 8.0 41.0 51 >40  Knee    12.1  5.3         Right Tibial Motor (Abd Hall Brev)  35.1C  Ankle    3.0 <6.0 7.5 >8 Knee Ankle 8.7 42.0 48 >40  Knee    11.7  5.1          H Reflex Studies   NR H-Lat (ms) Lat Norm (ms) L-R H-Lat (ms)  Right Tibial (Gastroc)  35.1C     29.93 <35    EMG   Side Muscle Ins Act Fibs Psw Fasc Number Recrt Dur Dur. Amp Amp. Poly Poly. Comment  Right AntTibialis Nml Nml Nml Nml Nml Nml Nml Nml Nml Nml Nml  Nml N/A  Right Gastroc Nml Nml Nml Nml 1- Rapid Some 1+ Some 1+ Nml Nml N/A  Right Flex Dig Long Nml Nml Nml Nml Nml Nml Nml Nml Nml Nml Nml Nml N/A  Right RectFemoris Nml Nml Nml Nml Nml Nml Nml Nml Nml Nml Nml Nml N/A  Right Iliopsoas Nml Nml Nml Nml Nml Nml Nml Nml Nml Nml Nml Nml N/A  Right GluteusMed Nml Nml Nml Nml Nml Nml Nml Nml Nml Nml Nml Nml N/A  Right Lumbo Parasp Low Nml Nml Nml Nml NE - - - - - - Nml N/A  Right BicepsFemS Nml Nml Nml Nml 1- Rapid Few 1+ Few 1+ Nml Nml N/A  Left BicepsFemS Nml Nml Nml Nml Nml Nml Nml Nml Nml Nml Nml Nml N/A  Left Gastroc Nml Nml Nml Nml Nml Nml Nml Nml Nml Nml Nml Nml N/A      Waveforms:

## 2017-09-09 NOTE — Telephone Encounter (Signed)
LOV: 08/12/17  PCP:Dr. Volanda Napoleon  Pharmacy: [facility]

## 2017-09-09 NOTE — Telephone Encounter (Signed)
Copied from [place]. Topic: Quick Communication - Rx Refill/Question >> Sep 09, 2017  9:50 AM Waylan Rocher, Lumin L wrote: Medication:  meloxicam (MOBIC) 15 MG tablet   Has the patient contacted their pharmacy? Yes.     (Agent: If no, request that the patient contact the pharmacy for the refill.)   Preferred Pharmacy (with phone number or street name): [place], [place] - [address] [place] [address] Phone: [phone] Fax: [phone]     Agent: Please be advised that RX refills may take up to 3 business days. We ask that you follow-up with your pharmacy.

## 2017-09-12 ENCOUNTER — Encounter: Payer: Self-pay | Admitting: Sports Medicine

## 2017-09-12 ENCOUNTER — Ambulatory Visit (INDEPENDENT_AMBULATORY_CARE_PROVIDER_SITE_OTHER): Payer: BC Managed Care – PPO | Admitting: Sports Medicine

## 2017-09-12 ENCOUNTER — Ambulatory Visit: Payer: Self-pay

## 2017-09-12 VITALS — BP 124/88 | HR 105 | Ht 70.0 in | Wt 256.0 lb

## 2017-09-12 DIAGNOSIS — E6609 Other obesity due to excess calories: Secondary | ICD-10-CM

## 2017-09-12 DIAGNOSIS — G8929 Other chronic pain: Secondary | ICD-10-CM | POA: Diagnosis not present

## 2017-09-12 DIAGNOSIS — M5416 Radiculopathy, lumbar region: Secondary | ICD-10-CM

## 2017-09-12 DIAGNOSIS — M5441 Lumbago with sciatica, right side: Secondary | ICD-10-CM

## 2017-09-12 DIAGNOSIS — M5417 Radiculopathy, lumbosacral region: Secondary | ICD-10-CM | POA: Insufficient documentation

## 2017-09-12 DIAGNOSIS — M1611 Unilateral primary osteoarthritis, right hip: Secondary | ICD-10-CM

## 2017-09-12 DIAGNOSIS — Z6837 Body mass index (BMI) 37.0-37.9, adult: Secondary | ICD-10-CM

## 2017-09-12 DIAGNOSIS — R269 Unspecified abnormalities of gait and mobility: Secondary | ICD-10-CM

## 2017-09-12 NOTE — Patient Instructions (Signed)

## 2017-09-12 NOTE — Procedures (Signed)
PROCEDURE NOTE:  Ultrasound Guided: Injection: right Hip Images were obtained and interpreted by myself, Teresa Coombs, DO  Images have been saved and stored to PACS system. Images obtained on: GE S7 Ultrasound machine  ULTRASOUND FINDINGS:  Fairly severe degenerative changes of the femoral acetabular joint with large joint effusion  DESCRIPTION OF PROCEDURE:  The patient's clinical condition is marked by substantial pain and/or significant functional disability. Other conservative therapy has not provided relief, is contraindicated, or not appropriate. There is a reasonable likelihood that injection will significantly improve the patient's pain and/or functional impairment.  After discussing the risks, benefits and expected outcomes of the injection and all questions were reviewed and answered, the patient wished to undergo the above named procedure. Verbal consent was obtained.  The ultrasound was used to identify the target structure and adjacent neurovascular structures. The skin was then prepped in sterile fashion and the target structure was injected under direct visualization using sterile technique as below:  PREP: Alcohol, Ethel Chloride  APPROACH: direct, stopcock technique, 22g 3.5in.  INJECTATE: 5cc: 1% lidocaine, 2cc: 0.5% marcaine, 2cc: 40mg /mL DepoMedrol   ASPIRATE: None   DRESSING: Band-Aid   Post procedural instructions including recommending icing and warning signs for infection were reviewed.   This procedure was well tolerated and there were no complications.  She did have good but incomplete relief with anesthetic phase after the injection   IMPRESSION: Succesful Ultrasound Guided: Injection

## 2017-09-12 NOTE — Progress Notes (Signed)
Rachel Warren. [place], [place] at [facility] [phone]  Rachel Warren - 42 y.o. female MRN [id number]  Date of birth: Mar 10, 1976  Visit Date: 09/12/2017  PCP: Billie Ruddy, MD   Referred by: Billie Ruddy, MD   Scribe for today's visit: Josepha Pigg, CMA     SUBJECTIVE:  Rachel Warren is here for Follow-up (low back pain)  08/19/17: Her LBP symptoms INITIALLY: Began 4 years ago w/ no MOI Described as 4/10 throbbing pain in her R knee and soreness in her R thigh , radiating to R LE from hip to toes Worsened with ADLs  Improved with epidurals and rest Additional associated symptoms include: N/T into the R LE but no change in bowel or bladder habits At this time symptoms are worsening compared to onset w/ increased intensity and frequency of pain. She has been taking Flexeril, has gotten a few epidural injections.  She has gotten a lumbar spine MRI in fall 2018 that indicated loss of disc space between L4 and 5.  09/12/17: Compared to the last office visit, her previously described symptoms show no change  Current symptoms are moderate & are radiating to R leg, hip to toes.  She has been taking Flexeril with some relief.   NCV & EMG done 09/09/17   ROS Reports night time disturbances. Denies fevers, chills, or night sweats. Denies unexplained weight loss. Denies personal history of cancer. Denies changes in bowel or bladder habits. Denies recent unreported falls. Denies new or worsening dyspnea or wheezing. Reports headaches or dizziness.  Reports numbness, tingling or weakness  In the extremities.  Denies dizziness or presyncopal episodes Reports lower extremity edema , RLE   HISTORY & PERTINENT PRIOR DATA:  Prior History reviewed and updated per electronic medical record.  Significant history, findings, studies and interim changes include:  reports that she quit smoking about 2 months ago. Her smoking  use included cigarettes. she has never used smokeless tobacco. No results for input(s): HGBA1C, LABURIC, CREATINE in the last 8760 hours. No specialty comments available. No problems updated.  OBJECTIVE:  VS:  HT:5\' 10"  (177.8 cm)   WT:256 lb (116.1 kg)  BMI:36.73    BP:124/88  HR:(!) 105bpm  TEMP: ( )  RESP:99 %   PHYSICAL EXAM: Constitutional: WDWN, Non-toxic appearing. Psychiatric: Alert & appropriately interactive.  Not depressed or anxious appearing. Respiratory: No increased work of breathing.  Trachea Midline Eyes: Pupils are equal.  EOM intact without nystagmus.  No scleral icterus  NEUROVASCULAR exam: No clubbing or cyanosis appreciated No significant venous stasis changes Capillary Refill: normal, less than 2 seconds   Pain with logroll and straight leg raise.  She has weakness with leg extension.  Dorsiflexion and plantarflexion strength is intact however.  She walks with a markedly antalgic gait   ASSESSMENT & PLAN:   1. Chronic bilateral low back pain with right-sided sciatica   2. Primary osteoarthritis of right hip   3. Abnormal gait   4. Class 2 obesity due to excess calories without serious comorbidity with body mass index (BMI) of 37.0 to 37.9 in adult   5. Lumbar radiculopathy, chronic    ++++++++++++++++++++++++++++++++++++++++++++ Orders & Meds:  Orders Placed This Encounter  Procedures  . [place] MSK POCT ULTRASOUND   No orders of the defined types were placed in this encounter.   ++++++++++++++++++++++++++++++++++++++++++++ PLAN:   Findings:  Very difficult situation.  EMGs were reviewed that did show that she has  a chronic S1 radiculopathy however this does not correlate with the hip and leg pain that she is having more weakness.  This is likely coming from underlying osteoarthritis that was appreciated on CT scan 2 years ago.  Performing diagnostic and therapeutic injection today and will plan to see her back in 6 weeks to see how she has been doing.     I would like to plan on getting x-rays at her follow-up appointment   S1 radiculopathy at this time no further intervention is indicated but this does correlate with the findings she has on her prior MRI.  If she does have good improvement in her thigh and groin pain but has persistent pain radiating into the foot further evaluation/intervention will be discussed.    Ultimately she will likely require total hip arthroplasty but will avoid she would like to try this if possible however understands if any persistent lack of improvement this will be recommended.    Follow-up: Return in about 6 weeks (around 10/24/2017).   Pertinent documentation may be included in additional procedure notes, imaging studies, problem based documentation and patient instructions. Please see these sections of the encounter for additional information regarding this visit. CMA/ATC served as Education administrator during this visit. History, Physical, and Plan performed by medical provider. Documentation and orders reviewed and attested to.      Gerda Diss, [place] [facility] Physician

## 2017-10-04 ENCOUNTER — Other Ambulatory Visit: Payer: Self-pay | Admitting: Family Medicine

## 2017-10-04 DIAGNOSIS — M5441 Lumbago with sciatica, right side: Principal | ICD-10-CM

## 2017-10-04 DIAGNOSIS — G8929 Other chronic pain: Secondary | ICD-10-CM

## 2017-10-24 ENCOUNTER — Ambulatory Visit (INDEPENDENT_AMBULATORY_CARE_PROVIDER_SITE_OTHER): Payer: BC Managed Care – PPO

## 2017-10-24 ENCOUNTER — Ambulatory Visit (INDEPENDENT_AMBULATORY_CARE_PROVIDER_SITE_OTHER): Payer: BC Managed Care – PPO | Admitting: Sports Medicine

## 2017-10-24 ENCOUNTER — Encounter: Payer: Self-pay | Admitting: Sports Medicine

## 2017-10-24 ENCOUNTER — Other Ambulatory Visit: Payer: Self-pay | Admitting: Sports Medicine

## 2017-10-24 VITALS — BP 140/88 | HR 89

## 2017-10-24 DIAGNOSIS — G8929 Other chronic pain: Secondary | ICD-10-CM | POA: Diagnosis not present

## 2017-10-24 DIAGNOSIS — R269 Unspecified abnormalities of gait and mobility: Secondary | ICD-10-CM

## 2017-10-24 DIAGNOSIS — M5441 Lumbago with sciatica, right side: Secondary | ICD-10-CM

## 2017-10-24 DIAGNOSIS — M5417 Radiculopathy, lumbosacral region: Secondary | ICD-10-CM | POA: Diagnosis not present

## 2017-10-24 DIAGNOSIS — M1611 Unilateral primary osteoarthritis, right hip: Secondary | ICD-10-CM

## 2017-10-24 LAB — POCT URINE PREGNANCY: Preg Test, Ur: NEGATIVE

## 2017-10-24 MED ORDER — CELECOXIB 200 MG PO CAPS: 200.0000 mg | ORAL_CAPSULE | Freq: Every day | ORAL | 2 refills | Status: DC

## 2017-10-24 NOTE — Progress Notes (Signed)
[name]. Benelli Winther, [place] at [facility] [phone]  Jasmynn Pfalzgraf - 42 y.o. female MRN [id number]  Date of birth: 1975/11/08  Visit Date: 10/24/2017  PCP: Billie Ruddy, MD   Referred by: Billie Ruddy, MD  Scribe for today's visit: Josepha Pigg, CMA     SUBJECTIVE:  Rachel Warren is here for Follow-up (LBP)  08/19/17: Her LBP symptoms INITIALLY: Began 4 years ago w/ no MOI Described as 4/10 throbbing pain in her R knee and soreness in her R thigh , radiating to R LE from hip to toes Worsened with ADLs  Improved with epidurals and rest Additional associated symptoms include: N/T into the R LE but no change in bowel or bladder habits At this time symptoms are worsening compared to onset w/ increased intensity and frequency of pain. She has been taking Flexeril, has gotten a few epidural injections.  She has gotten a lumbar spine MRI in fall 2018 that indicated loss of disc space between L4 and 5.  09/12/17: Compared to the last office visit, her previously described symptoms show no change  Current symptoms are moderate & are radiating to R leg, hip to toes.  She has been taking Flexeril with some relief.   NCV & EMG done 09/09/17  10/24/2017: Compared to the last office visit, her previously described symptoms show no change  Current symptoms are moderate-severe & are radiating to RLE She has been taking flexeril sparingly. She has tried taking Aleve PM but got no relief. She is also taking Meloxicam with minimal relief. She has IBU 800 mg that she takes prn.  After inj at last visit it was easier to walk but she was still having pain in the lower back and hips.  She reports a new sx, constant burning sensation in both knees despite what she is doing. This has been going on x 3 weeks.   She notes that she recently found out that her mother had Lupus and was dx with sickle cell trait.   ROS Reports night time  disturbances. Denies fevers, chills, or night sweats. Denies unexplained weight loss. Denies personal history of cancer. Denies changes in bowel or bladder habits. Denies recent unreported falls. Denies new or worsening dyspnea or wheezing. Reports headaches.  Reports numbness, tingling or weakness  In the extremities.  Denies dizziness or presyncopal episodes Reports lower extremity edema    HISTORY & PERTINENT PRIOR DATA:  Prior History reviewed and updated per electronic medical record.  Significant/pertinent history, findings, studies include:  reports that she quit smoking about 5 months ago. Her smoking use included cigarettes. She has never used smokeless tobacco. No results for input(s): HGBA1C, LABURIC, CREATINE in the last 8760 hours. No specialty comments available. No problems updated.  OBJECTIVE:  VS:  HT:    WT:   BMI:     BP:140/88  HR:89bpm  TEMP: ( )  RESP:97 %   PHYSICAL EXAM: Constitutional: WDWN, Non-toxic appearing. Psychiatric: Alert & appropriately interactive.  Not depressed or anxious appearing. Respiratory: No increased work of breathing.  Trachea Midline Eyes: Pupils are equal.  EOM intact without nystagmus.  No scleral icterus  Vascular Exam: warm to touch DP & PT Pulses: present Trace pitting edema bilaterally.  lower extremity neuro exam:  She is some pain with straight leg raise bilaterally but pain is most significant on the right leg with hip internal and external range of motion testing.  Pain with  Corky Sox and Fader testing.  Pain Stinchfield testing.  She is a marked antalgic gait   ASSESSMENT & PLAN:   1. Primary osteoarthritis of right hip   2. Lumbosacral radiculopathy at S1   3. Chronic bilateral low back pain with right-sided sciatica   4. Abnormal gait     PLAN: Given the polyarthralgia complaints with and check some blood work today to see if there is any potential rheumatologic etiology.  Otherwise we can have her titrate  her anti-inflammatories and see which is most effective for her including Celebrex.  Recommend follow-up with orthopedics for consideration of total hip arthroplasty.  Follow-up: No follow-ups on file.      Please see additional documentation for Objective, Assessment and Plan sections. Pertinent additional documentation may be included in corresponding procedure notes, imaging studies, problem based documentation and patient instructions. Please see these sections of the encounter for additional information regarding this visit.  CMA/ATC served as Education administrator during this visit. History, Physical, and Plan performed by medical provider. Documentation and orders reviewed and attested to.      Gerda Diss, [place] [facility] Physician

## 2017-10-27 ENCOUNTER — Other Ambulatory Visit: Payer: Self-pay

## 2017-10-27 MED ORDER — CELECOXIB 200 MG PO CAPS: 200.0000 mg | ORAL_CAPSULE | Freq: Every day | ORAL | 2 refills | Status: DC

## 2017-10-28 LAB — CYCLIC CITRUL PEPTIDE ANTIBODY, IGG

## 2017-10-28 LAB — COMPREHENSIVE METABOLIC PANEL
AG RATIO: 1.7 (calc) (ref 1.0–2.5)
ALBUMIN MSPROF: 3.9 g/dL (ref 3.6–5.1)
ALT: 15 U/L (ref 6–29)
AST: 16 U/L (ref 10–30)
Alkaline phosphatase (APISO): 68 U/L (ref 33–115)
BILIRUBIN TOTAL: 0.3 mg/dL (ref 0.2–1.2)
BUN: 10 mg/dL (ref 7–25)
CALCIUM: 8.8 mg/dL (ref 8.6–10.2)
CO2: 29 mmol/L (ref 20–32)
Chloride: 105 mmol/L (ref 98–110)
Creat: 0.68 mg/dL (ref 0.50–1.10)
GLUCOSE: 80 mg/dL (ref 65–99)
Globulin: 2.3 g/dL (calc) (ref 1.9–3.7)
Potassium: 3.7 mmol/L (ref 3.5–5.3)
SODIUM: 138 mmol/L (ref 135–146)
TOTAL PROTEIN: 6.2 g/dL (ref 6.1–8.1)

## 2017-10-28 LAB — CBC WITH DIFFERENTIAL/PLATELET
BASOS PCT: 0.7 %
Basophils Absolute: 42 cells/uL (ref 0–200)
EOS PCT: 0.2 %
Eosinophils Absolute: 12 cells/uL — ABNORMAL LOW (ref 15–500)
HEMATOCRIT: 28.1 % — AB (ref 35.0–45.0)
HEMOGLOBIN: 8.2 g/dL — AB (ref 11.7–15.5)
LYMPHS ABS: 1524 {cells}/uL (ref 850–3900)
MCH: 18.9 pg — ABNORMAL LOW (ref 27.0–33.0)
MCHC: 29.2 g/dL — AB (ref 32.0–36.0)
MCV: 64.9 fL — ABNORMAL LOW (ref 80.0–100.0)
MPV: 10.3 fL (ref 7.5–12.5)
Monocytes Relative: 8.1 %
NEUTROS ABS: 3936 {cells}/uL (ref 1500–7800)
Neutrophils Relative %: 65.6 %
Platelets: 301 10*3/uL (ref 140–400)
RBC: 4.33 10*6/uL (ref 3.80–5.10)
RDW: 17.6 % — ABNORMAL HIGH (ref 11.0–15.0)
Total Lymphocyte: 25.4 %
WBC: 6 10*3/uL (ref 3.8–10.8)
WBCMIX: 486 {cells}/uL (ref 200–950)

## 2017-10-28 LAB — C-REACTIVE PROTEIN: CRP: 2.1 mg/L (ref <8.0)

## 2017-10-28 LAB — RHEUMATOID FACTOR

## 2017-10-28 LAB — ANA: ANA: NEGATIVE

## 2017-10-28 LAB — SEDIMENTATION RATE: Sed Rate: 14 mm/h (ref 0–20)

## 2017-10-28 NOTE — Progress Notes (Signed)
Please call patient and inform her she needs to follow-up with her PCP for her blood levels being low.  She is anemic.  And it is actually slightly worse than what it was 2 months ago.  Otherwise the blood work that we obtained overall is reassuring.

## 2017-10-29 ENCOUNTER — Telehealth: Payer: Self-pay | Admitting: Family Medicine

## 2017-10-29 ENCOUNTER — Other Ambulatory Visit: Payer: Self-pay | Admitting: Family Medicine

## 2017-10-29 DIAGNOSIS — D509 Iron deficiency anemia, unspecified: Secondary | ICD-10-CM

## 2017-10-29 MED ORDER — FERROUS SULFATE 325 (65 FE) MG PO TABS: 325.0000 mg | ORAL_TABLET | Freq: Every day | ORAL | 3 refills | Status: DC

## 2017-10-29 NOTE — Telephone Encounter (Signed)
Please advise 

## 2017-10-29 NOTE — Telephone Encounter (Signed)
Copied from [place] [id number]. Topic: General - Other >> Oct 29, 2017  3:24 PM Marin Olp L wrote: Reason for CRM: Patient found out she is experiencing anemia after seeing Dr. Paulla Fore at [facility] on 04/12 and wants to knowif Dr. Volanda Napoleon will call in iron to her pharmacy. Her pharmacy is [place], [place] - [address] [place] [address] Phone: [phone] Fax: [phone]. Patient would like a call back to fin dout what Dr. Volanda Napoleon recommends.

## 2017-10-29 NOTE — Telephone Encounter (Signed)
Iron sent to the pharmacy.  Please advise patient that iron can make her stools dark and cause constipation.  If she experiences constipation she can take MiraLAX as needed for her symptoms.

## 2017-10-30 ENCOUNTER — Telehealth: Payer: Self-pay | Admitting: Sports Medicine

## 2017-10-30 ENCOUNTER — Telehealth: Payer: Self-pay | Admitting: Family Medicine

## 2017-10-30 ENCOUNTER — Other Ambulatory Visit: Payer: Self-pay | Admitting: Physical Therapy

## 2017-10-30 DIAGNOSIS — M25551 Pain in right hip: Secondary | ICD-10-CM

## 2017-10-30 DIAGNOSIS — M25552 Pain in left hip: Principal | ICD-10-CM

## 2017-10-30 NOTE — Telephone Encounter (Signed)
Returned pt's call regarding x-ray results and informed her that Dr. Paulla Fore recommends seeing Dr. Ninfa Linden at [facility] for her hip pain.  Pt agrees w/ this and asks [place] to place the referral which I did.  She also asks 2 f/u questions about pain medication to help w/ a long flight she has coming up in May and also about getting a handicap placard.

## 2017-10-30 NOTE — Telephone Encounter (Signed)
See note

## 2017-10-30 NOTE — Telephone Encounter (Signed)
Copied from [place] [id number]. Topic: Quick Communication - See Telephone Encounter >> Oct 30, 2017  8:47 AM Synthia Innocent wrote: CRM for notification. See Telephone encounter for: 10/30/17. Patient will be flying in May and is requesting something for pain while traveling. Requesting handicap placard. And xray results from Dr Paulla Fore. Please advise

## 2017-10-30 NOTE — Telephone Encounter (Signed)
See note.   Copied from [place] [id number]. Topic: Quick Communication - Other Results >> Oct 30, 2017  8:47 AM Synthia Innocent wrote: Requesting xray results

## 2017-10-30 NOTE — Telephone Encounter (Signed)
Spoke with pt and informed her that iron was sent to the pharmacy. Pt verbalized understanding.

## 2017-11-06 ENCOUNTER — Other Ambulatory Visit (INDEPENDENT_AMBULATORY_CARE_PROVIDER_SITE_OTHER): Payer: Self-pay | Admitting: Orthopaedic Surgery

## 2017-11-06 ENCOUNTER — Encounter (INDEPENDENT_AMBULATORY_CARE_PROVIDER_SITE_OTHER): Payer: Self-pay | Admitting: Orthopaedic Surgery

## 2017-11-06 ENCOUNTER — Ambulatory Visit (INDEPENDENT_AMBULATORY_CARE_PROVIDER_SITE_OTHER): Payer: BC Managed Care – PPO | Admitting: Orthopaedic Surgery

## 2017-11-06 DIAGNOSIS — M1612 Unilateral primary osteoarthritis, left hip: Secondary | ICD-10-CM | POA: Diagnosis not present

## 2017-11-06 DIAGNOSIS — M1611 Unilateral primary osteoarthritis, right hip: Secondary | ICD-10-CM | POA: Diagnosis not present

## 2017-11-06 MED ORDER — TRAMADOL HCL 50 MG PO TABS: 50.0000 mg | ORAL_TABLET | Freq: Four times a day (QID) | ORAL | 0 refills | Status: DC | PRN

## 2017-11-06 NOTE — Progress Notes (Signed)
Office Visit Note   Patient: Rachel Warren           Date of Birth: 1975-09-24           MRN: [id number] Visit Date: 11/06/2017              Requested by: Gerda Diss, DO [place], [place] [address] PCP: Billie Ruddy, MD   Assessment & Plan: Visit Diagnoses:  1. Unilateral primary osteoarthritis, left hip   2. Unilateral primary osteoarthritis, right hip     Plan: At this point there really any other treatment options given the severity of her right hip arthritis and it being almost a subluxed joint at this point.  Other conservative treatment measures such as an intra-articular injection are not even feasible at this point given the severe deformity of her hip.  I talked her about hip replacement surgery we had a long and thorough discussion about the surgery involves including a thorough discussion the risk and benefits of surgery.  I went over x-rays with her and gave her a copy of these.  I gave her handout on hip replacement surgery and we had a discussion about the risk minutes the surgery including but her intraoperative and postoperative course were involved.  All questions concerns were answered and addressed.  We will work on getting this scheduled for her.  She understands at some point she may end up needing her left hip replaced but we will start with the right hip due to the severe deformity of it and her unrelenting pain.  Follow-Up Instructions: Return for 2 weeks post-op.   Orders:  No orders of the defined types were placed in this encounter.  No orders of the defined types were placed in this encounter.     Procedures: No procedures performed   Clinical Data: No additional findings.   Subjective: Chief Complaint  Patient presents with  . Right Hip - Pain  . Left Hip - Pain  The patient is someone I am seeing for the first time.  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  She walks with a significant Trendelenburg gait and has daily pain that is severe.  At this point is 10 out of 10 at times.  It is detrimental effect directives daily living, her quality of life, and her mobility.  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  She has been told she has severe arthritis in her right hip and sent to me for evaluation treatment.  She is not a diabetic and denies any previous injuries to her hip.  However she does have family members who have all had hip replacements.  Her pain is in her groin and radiates down her leg.  Is not in her back at all.  She is someone with a BMI of 36.7.  Her pain is been worsening over multiple years now.  She is work on activity modification as well as weight loss.  She takes anti-inflammatories and is tried a cane as well.    HPI  Review of Systems She currently denies any headache, chest pain, shortness of breath, fever, chills, nausea, vomiting.   Objective: Vital Signs: There were no vitals taken for this visit.  Physical Exam She is alert and oriented x3 and in no acute distress Ortho Exam She walks with a significant limp and a Trendelenburg gait.  Examination  of her right hip shows significant limitations of internal and external rotation and severe pain with attempted rotation.  Her leg lengths show significant difference with her right hip and leg being much shorter than the left side. Specialty Comments:  No specialty comments available.  Imaging: No results found. An AP pelvis and lateral of both hips show severe end-stage arthritis of both hips but is much worse on the right side.  She actually has almost a subluxed joint at this point.  There is significant deformity of the femoral head.  There is complete loss of the joint space.  Severe periarticular osteophytes throughout the joint as well as cystic change in the femoral head and femoral neck as well as the acetabulum.  PMFS History: Patient  Active Problem List   Diagnosis Date Noted  . Unilateral primary osteoarthritis, left hip 11/06/2017  . Unilateral primary osteoarthritis, right hip 11/06/2017  . Lumbosacral radiculopathy at S1 09/12/2017  . Back pain 08/19/2017  . 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  . Abnormal gait 08/19/2017  . Obesity 08/19/2017  . Ventral hernia s/p laparoscopic repair with mesh 02/27/15 02/27/2015   Past Medical History:  Diagnosis Date  . Anemia   . Arthritis   . Back pain   . Pinched nerve    in back    History reviewed. No pertinent family history.  Past Surgical History:  Procedure Laterality Date  . benign breast cyst removed    . CHOLECYSTECTOMY    . INSERTION OF MESH N/A 02/27/2015   Procedure: INSERTION OF MESH;  Surgeon: Jackolyn Confer, MD;  Location: [facility];  Service: General;  Laterality: N/A;  . LAPAROSCOPIC ASSISTED VENTRAL HERNIA REPAIR N/A 02/27/2015   Procedure: LAPAROSCOPIC VENTRAL HERNIA REPAIR WITH MESH;  Surgeon: Jackolyn Confer, MD;  Location: [facility];  Service: General;  Laterality: N/A;  . THYROIDECTOMY, PARTIAL     Social History   Occupational History  . Not on file  Tobacco Use  . Smoking status: Former Smoker    Types: Cigarettes    Last attempt to quit: 06/14/2017    Years since quitting: 0.3  . Smokeless tobacco: Never Used  Substance and Sexual Activity  . Alcohol use: Yes    Comment: occasionally  . Drug use: Yes    Types: Marijuana  . Sexual activity: Not on file

## 2017-11-13 ENCOUNTER — Telehealth (INDEPENDENT_AMBULATORY_CARE_PROVIDER_SITE_OTHER): Payer: Self-pay | Admitting: Orthopaedic Surgery

## 2017-11-13 NOTE — Telephone Encounter (Signed)
Patient called and would like a work note sent to her, writing her out of work starting today up until her surgery. She said she couldn't go to work today due to not being able to walk. If you can email her the note once complete: [email or web address]

## 2017-11-13 NOTE — Telephone Encounter (Signed)
11/28/17 

## 2017-11-13 NOTE — Telephone Encounter (Signed)
When's her surgery?

## 2017-11-14 ENCOUNTER — Other Ambulatory Visit (INDEPENDENT_AMBULATORY_CARE_PROVIDER_SITE_OTHER): Payer: Self-pay | Admitting: Physician Assistant

## 2017-11-14 ENCOUNTER — Encounter (INDEPENDENT_AMBULATORY_CARE_PROVIDER_SITE_OTHER): Payer: Self-pay

## 2017-11-14 MED ORDER — HYDROCODONE-ACETAMINOPHEN 10-325 MG PO TABS: 1.0000 | ORAL_TABLET | Freq: Four times a day (QID) | ORAL | 0 refills | Status: AC | PRN

## 2017-11-14 NOTE — Telephone Encounter (Signed)
See note

## 2017-11-14 NOTE — Telephone Encounter (Signed)
Called and informed pt that an rx for Norco has been put in to her pharmacy.

## 2017-11-14 NOTE — Addendum Note (Signed)
Addended by: Teresa Coombs D on: 11/14/2017 02:17 PM   Modules accepted: Orders

## 2017-11-14 NOTE — Progress Notes (Signed)
Need orders in epic for 5-17 surgery

## 2017-11-14 NOTE — Telephone Encounter (Signed)
database previously reviewed and is appropriate.  Short-term obesity given severe degenerative changes of the hips appropriate.  Prescription sent to her pharmacy.

## 2017-11-14 NOTE — Telephone Encounter (Signed)
Patient called to check on status of receiving email.

## 2017-11-14 NOTE — Telephone Encounter (Signed)
Can you email note to her? It's in the chart under "letters"

## 2017-11-14 NOTE — Telephone Encounter (Addendum)
Relation to IY:MEBR Call back number: [phone] Pharmacy: [place], [place] - [address] [phone] (Phone) [phone] (Fax)      Reason for call:  Patient checking on the status off medication request from 10/30/17, patient flying out on Monday 11/17/17 returning Friday 11/21/17 , please advise

## 2017-11-15 ENCOUNTER — Other Ambulatory Visit: Payer: Self-pay | Admitting: Family Medicine

## 2017-11-18 ENCOUNTER — Other Ambulatory Visit (INDEPENDENT_AMBULATORY_CARE_PROVIDER_SITE_OTHER): Payer: Self-pay

## 2017-11-18 ENCOUNTER — Other Ambulatory Visit (HOSPITAL_COMMUNITY): Payer: Self-pay | Admitting: Emergency Medicine

## 2017-11-18 NOTE — Patient Instructions (Signed)
Rachel Warren  11/18/2017   Your procedure is scheduled on: 11-28-17   Report to [facility] Main  Entrance    Report to admitting at 6:00AM    Call this number if you have problems the morning of surgery [phone]     Remember: Do not eat food or drink liquids :After Midnight.     Take these medicines the morning of surgery with A SIP OF WATER: duloxetine, tramadol if needed                                You may not have any metal on your body including hair pins and              piercings  Do not wear jewelry, make-up, lotions, powders or perfumes, deodorant             Do not wear nail polish.  Do not shave  48 hours prior to surgery.               Do not bring valuables to the hospital. [place].  Contacts, dentures or bridgework may not be worn into surgery.  Leave suitcase in the car. After surgery it may be brought to your room.                 Please read over the following fact sheets you were given: _____________________________________________________________________             [facility] - Preparing for Surgery Before surgery, you can play an important role.  Because skin is not sterile, your skin needs to be as free of germs as possible.  You can reduce the number of germs on your skin by washing with CHG (chlorahexidine gluconate) soap before surgery.  CHG is an antiseptic cleaner which kills germs and bonds with the skin to continue killing germs even after washing. Please DO NOT use if you have an allergy to CHG or antibacterial soaps.  If your skin becomes reddened/irritated stop using the CHG and inform your nurse when you arrive at Short Stay. Do not shave (including legs and underarms) for at least 48 hours prior to the first CHG shower.  You may shave your face/neck. Please follow these instructions carefully:  1.  Shower with CHG Soap the night before surgery and the   morning of Surgery.  2.  If you choose to wash your hair, wash your hair first as usual with your  normal  shampoo.  3.  After you shampoo, rinse your hair and body thoroughly to remove the  shampoo.                           4.  Use CHG as you would any other liquid soap.  You can apply chg directly  to the skin and wash                       Gently with a scrungie or clean washcloth.  5.  Apply the CHG Soap to your body ONLY FROM THE NECK DOWN.   Do not use on face/ open  Wound or open sores. Avoid contact with eyes, ears mouth and genitals (private parts).                       Wash face,  Genitals (private parts) with your normal soap.             6.  Wash thoroughly, paying special attention to the area where your surgery  will be performed.  7.  Thoroughly rinse your body with warm water from the neck down.  8.  DO NOT shower/wash with your normal soap after using and rinsing off  the CHG Soap.                9.  Pat yourself dry with a clean towel.            10.  Wear clean pajamas.            11.  Place clean sheets on your bed the night of your first shower and do not  sleep with pets. Day of Surgery : Do not apply any lotions/deodorants the morning of surgery.  Please wear clean clothes to the hospital/surgery center.  FAILURE TO FOLLOW THESE INSTRUCTIONS MAY RESULT IN THE CANCELLATION OF YOUR SURGERY PATIENT SIGNATURE_________________________________  NURSE SIGNATURE__________________________________  ________________________________________________________________________   Rachel Warren  An incentive spirometer is a tool that can help keep your lungs clear and active. This tool measures how well you are filling your lungs with each breath. Taking long deep breaths may help reverse or decrease the chance of developing breathing (pulmonary) problems (especially infection) following:  A long period of time when you are unable to move or be  active. BEFORE THE PROCEDURE   If the spirometer includes an indicator to show your best effort, your nurse or respiratory therapist will set it to a desired goal.  If possible, sit up straight or lean slightly forward. Try not to slouch.  Hold the incentive spirometer in an upright position. INSTRUCTIONS FOR USE  1. Sit on the edge of your bed if possible, or sit up as far as you can in bed or on a chair. 2. Hold the incentive spirometer in an upright position. 3. Breathe out normally. 4. Place the mouthpiece in your mouth and seal your lips tightly around it. 5. Breathe in slowly and as deeply as possible, raising the piston or the ball toward the top of the column. 6. Hold your breath for 3-5 seconds or for as long as possible. Allow the piston or ball to fall to the bottom of the column. 7. Remove the mouthpiece from your mouth and breathe out normally. 8. Rest for a few seconds and repeat Steps 1 through 7 at least 10 times every 1-2 hours when you are awake. Take your time and take a few normal breaths between deep breaths. 9. The spirometer may include an indicator to show your best effort. Use the indicator as a goal to work toward during each repetition. 10. After each set of 10 deep breaths, practice coughing to be sure your lungs are clear. If you have an incision (the cut made at the time of surgery), support your incision when coughing by placing a pillow or rolled up towels firmly against it. Once you are able to get out of bed, walk around indoors and cough well. You may stop using the incentive spirometer when instructed by your caregiver.  RISKS AND COMPLICATIONS  Take your time so you do not get  dizzy or light-headed.  If you are in pain, you may need to take or ask for pain medication before doing incentive spirometry. It is harder to take a deep breath if you are having pain. AFTER USE  Rest and breathe slowly and easily.  It can be helpful to keep track of a log of  your progress. Your caregiver can provide you with a simple table to help with this. If you are using the spirometer at home, follow these instructions: [place] IF:   You are having difficultly using the spirometer.  You have trouble using the spirometer as often as instructed.  Your pain medication is not giving enough relief while using the spirometer.  You develop fever of 100.5 F (38.1 C) or higher. SEEK IMMEDIATE MEDICAL CARE IF:   You cough up bloody sputum that had not been present before.  You develop fever of 102 F (38.9 C) or greater.  You develop worsening pain at or near the incision site. MAKE SURE YOU:   Understand these instructions.  Will watch your condition.  Will get help right away if you are not doing well or get worse. Document Released: 11/11/2006 Document Revised: 09/23/2011 Document Reviewed: 01/12/2007 [facility] Patient Information 2014 [place], [place].   ________________________________________________________________________

## 2017-11-20 ENCOUNTER — Encounter (HOSPITAL_COMMUNITY)
Admission: RE | Admit: 2017-11-20 | Discharge: 2017-11-20 | Disposition: A | Discharge status: Home / Self Care | Payer: BC Managed Care – PPO | Source: Ambulatory Visit | Attending: Orthopaedic Surgery | Admitting: Orthopaedic Surgery

## 2017-11-20 ENCOUNTER — Encounter (HOSPITAL_COMMUNITY): Payer: Self-pay

## 2017-11-20 ENCOUNTER — Other Ambulatory Visit: Payer: Self-pay

## 2017-11-20 DIAGNOSIS — Z01818 Encounter for other preprocedural examination: Secondary | ICD-10-CM | POA: Insufficient documentation

## 2017-11-20 DIAGNOSIS — R9431 Abnormal electrocardiogram [ECG] [EKG]: Secondary | ICD-10-CM | POA: Diagnosis not present

## 2017-11-20 DIAGNOSIS — I10 Essential (primary) hypertension: Secondary | ICD-10-CM | POA: Diagnosis not present

## 2017-11-20 DIAGNOSIS — Z01812 Encounter for preprocedural laboratory examination: Secondary | ICD-10-CM | POA: Insufficient documentation

## 2017-11-20 DIAGNOSIS — M1611 Unilateral primary osteoarthritis, right hip: Secondary | ICD-10-CM | POA: Insufficient documentation

## 2017-11-20 DIAGNOSIS — Z0183 Encounter for blood typing: Secondary | ICD-10-CM | POA: Diagnosis not present

## 2017-11-20 HISTORY — DX: Depression, unspecified: F32.A

## 2017-11-20 HISTORY — DX: Essential (primary) hypertension: I10

## 2017-11-20 HISTORY — DX: Other complications of anesthesia, initial encounter: T88.59XA

## 2017-11-20 HISTORY — DX: Major depressive disorder, single episode, unspecified: F32.9

## 2017-11-20 HISTORY — DX: Adverse effect of unspecified anesthetic, initial encounter: T41.45XA

## 2017-11-20 LAB — BASIC METABOLIC PANEL
Anion gap: 10 (ref 5–15)
BUN: 8 mg/dL (ref 6–20)
CALCIUM: 8.8 mg/dL — AB (ref 8.9–10.3)
CO2: 25 mmol/L (ref 22–32)
CREATININE: 0.7 mg/dL (ref 0.44–1.00)
Chloride: 104 mmol/L (ref 101–111)
GFR calc non Af Amer: >60 mL/min (ref >60)
Glucose, Bld: 93 mg/dL (ref 65–99)
Potassium: 3.4 mmol/L — ABNORMAL LOW (ref 3.5–5.1)
Sodium: 139 mmol/L (ref 135–145)

## 2017-11-20 LAB — SURGICAL PCR SCREEN
MRSA, PCR: NEGATIVE
Staphylococcus aureus: NEGATIVE

## 2017-11-20 LAB — CBC
HCT: 33 % — ABNORMAL LOW (ref 36.0–46.0)
Hemoglobin: 9.4 g/dL — ABNORMAL LOW (ref 12.0–15.0)
MCH: 20 pg — ABNORMAL LOW (ref 26.0–34.0)
MCHC: 28.5 g/dL — AB (ref 30.0–36.0)
MCV: 70.2 fL — ABNORMAL LOW (ref 78.0–100.0)
PLATELETS: 250 10*3/uL (ref 150–400)
RBC: 4.7 MIL/uL (ref 3.87–5.11)
RDW: 20.8 % — AB (ref 11.5–15.5)
WBC: 5.1 10*3/uL (ref 4.0–10.5)

## 2017-11-20 LAB — HCG, SERUM, QUALITATIVE: PREG SERUM: NEGATIVE

## 2017-11-27 MED ORDER — TRANEXAMIC ACID 1000 MG/10ML IV SOLN
1000.0000 mg | INTRAVENOUS | Status: AC
  Administered 2017-11-28: 1000 mg via INTRAVENOUS
  Filled 2017-11-27: qty 1100

## 2017-11-27 NOTE — Anesthesia Preprocedure Evaluation (Addendum)
Anesthesia Evaluation  Patient identified by MRN, date of birth, ID band Patient awake    Reviewed: Allergy & Precautions, NPO status , Patient's Chart, lab work & pertinent test results  Airway Mallampati: II  TM Distance: >3 FB Neck ROM: Full    Dental no notable dental hx. (+) Teeth Intact, Dental Advisory Given   Pulmonary neg pulmonary ROS, former smoker,    Pulmonary exam normal breath sounds clear to auscultation       Cardiovascular Exercise Tolerance: Good hypertension, Pt. on medications negative cardio ROS Normal cardiovascular exam Rhythm:Regular Rate:Normal     Neuro/Psych    GI/Hepatic negative GI ROS, Neg liver ROS,   Endo/Other  negative endocrine ROS  Renal/GU negative Renal ROS     Musculoskeletal  (+) Arthritis ,   Abdominal (+) + obese,   Peds negative pediatric ROS (+)  Hematology  (+) anemia ,   Anesthesia Other Findings   Reproductive/Obstetrics                             Lab Results  Component Value Date   WBC 5.1 11/20/2017   HGB 9.4 (L) 11/20/2017   HCT 33.0 (L) 11/20/2017   MCV 70.2 (L) 11/20/2017   PLT 250 11/20/2017    Anesthesia Physical Anesthesia Plan  ASA: II  Anesthesia Plan: Spinal   Post-op Pain Management:    Induction:   PONV Risk Score and Plan: Treatment may vary due to age or medical condition  Airway Management Planned: Mask, Natural Airway and Nasal Cannula  Additional Equipment:   Intra-op Plan:   Post-operative Plan:   Informed Consent: I have reviewed the patients History and Physical, chart, labs and discussed the procedure including the risks, benefits and alternatives for the proposed anesthesia with the patient or authorized representative who has indicated his/her understanding and acceptance.     Plan Discussed with: CRNA  Anesthesia Plan Comments:         Anesthesia Quick Evaluation

## 2017-11-28 ENCOUNTER — Inpatient Hospital Stay (HOSPITAL_COMMUNITY): Payer: BC Managed Care – PPO

## 2017-11-28 ENCOUNTER — Encounter (HOSPITAL_COMMUNITY)
Admission: RE | Disposition: A | Discharge status: Home Health | Payer: Self-pay | Source: Ambulatory Visit | Attending: Orthopaedic Surgery

## 2017-11-28 ENCOUNTER — Encounter (HOSPITAL_COMMUNITY): Payer: Self-pay | Admitting: *Deleted

## 2017-11-28 ENCOUNTER — Other Ambulatory Visit: Payer: Self-pay

## 2017-11-28 ENCOUNTER — Inpatient Hospital Stay (HOSPITAL_COMMUNITY)
Admission: RE | Admit: 2017-11-28 | Discharge: 2017-12-01 | DRG: 470 | Disposition: A | Discharge status: Home Health | Payer: BC Managed Care – PPO | Source: Ambulatory Visit | Attending: Orthopaedic Surgery | Admitting: Orthopaedic Surgery

## 2017-11-28 ENCOUNTER — Inpatient Hospital Stay (HOSPITAL_COMMUNITY): Payer: BC Managed Care – PPO | Admitting: Anesthesiology

## 2017-11-28 DIAGNOSIS — Z6837 Body mass index (BMI) 37.0-37.9, adult: Secondary | ICD-10-CM | POA: Diagnosis not present

## 2017-11-28 DIAGNOSIS — I10 Essential (primary) hypertension: Secondary | ICD-10-CM | POA: Diagnosis present

## 2017-11-28 DIAGNOSIS — D62 Acute posthemorrhagic anemia: Secondary | ICD-10-CM | POA: Diagnosis not present

## 2017-11-28 DIAGNOSIS — M1611 Unilateral primary osteoarthritis, right hip: Principal | ICD-10-CM | POA: Diagnosis present

## 2017-11-28 DIAGNOSIS — E669 Obesity, unspecified: Secondary | ICD-10-CM | POA: Diagnosis present

## 2017-11-28 DIAGNOSIS — Z79899 Other long term (current) drug therapy: Secondary | ICD-10-CM | POA: Diagnosis not present

## 2017-11-28 DIAGNOSIS — E890 Postprocedural hypothyroidism: Secondary | ICD-10-CM | POA: Diagnosis present

## 2017-11-28 DIAGNOSIS — Z87891 Personal history of nicotine dependence: Secondary | ICD-10-CM | POA: Diagnosis not present

## 2017-11-28 DIAGNOSIS — F329 Major depressive disorder, single episode, unspecified: Secondary | ICD-10-CM | POA: Diagnosis present

## 2017-11-28 DIAGNOSIS — Z96641 Presence of right artificial hip joint: Secondary | ICD-10-CM

## 2017-11-28 DIAGNOSIS — Z419 Encounter for procedure for purposes other than remedying health state, unspecified: Secondary | ICD-10-CM

## 2017-11-28 DIAGNOSIS — M549 Dorsalgia, unspecified: Secondary | ICD-10-CM | POA: Diagnosis present

## 2017-11-28 DIAGNOSIS — Z9049 Acquired absence of other specified parts of digestive tract: Secondary | ICD-10-CM | POA: Diagnosis not present

## 2017-11-28 HISTORY — PX: TOTAL HIP ARTHROPLASTY: SHX124

## 2017-11-28 SURGERY — ARTHROPLASTY, HIP, TOTAL, ANTERIOR APPROACH
Anesthesia: Spinal | Site: Hip | Laterality: Right

## 2017-11-28 MED ORDER — METHOCARBAMOL 1000 MG/10ML IJ SOLN
500.0000 mg | Freq: Four times a day (QID) | INTRAMUSCULAR | Status: DC | PRN
  Administered 2017-11-28: 500 mg via INTRAVENOUS
  Filled 2017-11-28: qty 550

## 2017-11-28 MED ORDER — PHENOL 1.4 % MT LIQD
1.0000 | OROMUCOSAL | Status: DC | PRN
  Filled 2017-11-28: qty 177

## 2017-11-28 MED ORDER — SODIUM CHLORIDE 0.9 % IR SOLN
Status: DC | PRN
  Administered 2017-11-28: 1000 mL

## 2017-11-28 MED ORDER — METOCLOPRAMIDE HCL 5 MG PO TABS: 5.0000 mg | ORAL_TABLET | Freq: Three times a day (TID) | ORAL | Status: DC | PRN

## 2017-11-28 MED ORDER — METOCLOPRAMIDE HCL 5 MG/ML IJ SOLN: 5.0000 mg | Freq: Three times a day (TID) | INTRAMUSCULAR | Status: DC | PRN

## 2017-11-28 MED ORDER — CEFAZOLIN SODIUM-DEXTROSE 1-4 GM/50ML-% IV SOLN
1.0000 g | Freq: Four times a day (QID) | INTRAVENOUS | Status: AC
  Administered 2017-11-28 (×2): 1 g via INTRAVENOUS
  Filled 2017-11-28 (×2): qty 50

## 2017-11-28 MED ORDER — GABAPENTIN 100 MG PO CAPS
100.0000 mg | ORAL_CAPSULE | Freq: Three times a day (TID) | ORAL | Status: DC
  Administered 2017-11-28 – 2017-12-01 (×9): 100 mg via ORAL
  Filled 2017-11-28 (×9): qty 1

## 2017-11-28 MED ORDER — MIDAZOLAM HCL 2 MG/2ML IJ SOLN
INTRAMUSCULAR | Status: AC
  Filled 2017-11-28: qty 2

## 2017-11-28 MED ORDER — CEFAZOLIN SODIUM-DEXTROSE 2-4 GM/100ML-% IV SOLN
2.0000 g | INTRAVENOUS | Status: AC
  Administered 2017-11-28: 2 g via INTRAVENOUS
  Filled 2017-11-28: qty 100

## 2017-11-28 MED ORDER — OXYCODONE HCL 5 MG PO TABS
5.0000 mg | ORAL_TABLET | ORAL | Status: DC | PRN
  Administered 2017-11-28 (×2): 10 mg via ORAL
  Administered 2017-11-28: 5 mg via ORAL
  Administered 2017-11-29: 10 mg via ORAL
  Administered 2017-11-29: 5 mg via ORAL
  Administered 2017-11-30 (×2): 10 mg via ORAL
  Filled 2017-11-28 (×2): qty 1
  Filled 2017-11-28 (×5): qty 2

## 2017-11-28 MED ORDER — CHLORHEXIDINE GLUCONATE 4 % EX LIQD: 60.0000 mL | Freq: Once | CUTANEOUS | Status: DC

## 2017-11-28 MED ORDER — ASPIRIN 81 MG PO CHEW
81.0000 mg | CHEWABLE_TABLET | Freq: Two times a day (BID) | ORAL | Status: DC
Start: 2017-11-28 — End: 2017-12-01
  Administered 2017-11-28 – 2017-12-01 (×6): 81 mg via ORAL
  Filled 2017-11-28 (×6): qty 1

## 2017-11-28 MED ORDER — ALBUMIN HUMAN 5 % IV SOLN
INTRAVENOUS | Status: DC | PRN
  Administered 2017-11-28: 10:00:00 via INTRAVENOUS

## 2017-11-28 MED ORDER — BENAZEPRIL HCL 10 MG PO TABS
20.0000 mg | ORAL_TABLET | Freq: Every day | ORAL | Status: DC
  Administered 2017-11-29 – 2017-12-01 (×3): 20 mg via ORAL
  Filled 2017-11-28 (×3): qty 2

## 2017-11-28 MED ORDER — DEXAMETHASONE SODIUM PHOSPHATE 10 MG/ML IJ SOLN
INTRAMUSCULAR | Status: DC | PRN
  Administered 2017-11-28: 10 mg via INTRAVENOUS

## 2017-11-28 MED ORDER — FERROUS SULFATE 325 (65 FE) MG PO TABS
325.0000 mg | ORAL_TABLET | Freq: Every day | ORAL | Status: DC
  Administered 2017-11-29 – 2017-12-01 (×3): 325 mg via ORAL
  Filled 2017-11-28 (×3): qty 1

## 2017-11-28 MED ORDER — PHENYLEPHRINE 40 MCG/ML (10ML) SYRINGE FOR IV PUSH (FOR BLOOD PRESSURE SUPPORT)
PREFILLED_SYRINGE | INTRAVENOUS | Status: AC
  Filled 2017-11-28: qty 10

## 2017-11-28 MED ORDER — OXYCODONE HCL 5 MG PO TABS
10.0000 mg | ORAL_TABLET | ORAL | Status: DC | PRN
  Administered 2017-11-29 – 2017-11-30 (×5): 10 mg via ORAL
  Administered 2017-11-30 – 2017-12-01 (×3): 15 mg via ORAL
  Filled 2017-11-28: qty 2
  Filled 2017-11-28 (×2): qty 3
  Filled 2017-11-28: qty 2
  Filled 2017-11-28 (×2): qty 3
  Filled 2017-11-28: qty 2
  Filled 2017-11-28: qty 3

## 2017-11-28 MED ORDER — DEXAMETHASONE SODIUM PHOSPHATE 10 MG/ML IJ SOLN
INTRAMUSCULAR | Status: AC
  Filled 2017-11-28: qty 1

## 2017-11-28 MED ORDER — HYDROMORPHONE HCL 1 MG/ML IJ SOLN
0.2500 mg | INTRAMUSCULAR | Status: DC | PRN
  Administered 2017-11-28 (×4): 0.5 mg via INTRAVENOUS

## 2017-11-28 MED ORDER — MEPERIDINE HCL 50 MG/ML IJ SOLN: 6.2500 mg | INTRAMUSCULAR | Status: DC | PRN

## 2017-11-28 MED ORDER — DIPHENHYDRAMINE HCL 12.5 MG/5ML PO ELIX: 12.5000 mg | ORAL_SOLUTION | ORAL | Status: DC | PRN

## 2017-11-28 MED ORDER — ACETAMINOPHEN 325 MG PO TABS
325.0000 mg | ORAL_TABLET | Freq: Four times a day (QID) | ORAL | Status: DC | PRN
  Administered 2017-11-30 (×2): 650 mg via ORAL
  Filled 2017-11-28 (×2): qty 2

## 2017-11-28 MED ORDER — HYDROMORPHONE HCL 1 MG/ML IJ SOLN
INTRAMUSCULAR | Status: AC
  Filled 2017-11-28: qty 2

## 2017-11-28 MED ORDER — LIDOCAINE HCL (CARDIAC) PF 100 MG/5ML IV SOSY
PREFILLED_SYRINGE | INTRAVENOUS | Status: DC | PRN
  Administered 2017-11-28: 50 mg via INTRAVENOUS

## 2017-11-28 MED ORDER — DOCUSATE SODIUM 100 MG PO CAPS
100.0000 mg | ORAL_CAPSULE | Freq: Two times a day (BID) | ORAL | Status: DC
Start: 2017-11-28 — End: 2017-12-01
  Administered 2017-11-28 – 2017-12-01 (×6): 100 mg via ORAL
  Filled 2017-11-28 (×6): qty 1

## 2017-11-28 MED ORDER — ALBUMIN HUMAN 5 % IV SOLN
INTRAVENOUS | Status: AC
  Filled 2017-11-28: qty 250

## 2017-11-28 MED ORDER — AMLODIPINE BESY-BENAZEPRIL HCL 10-20 MG PO CAPS: 1.0000 | ORAL_CAPSULE | Freq: Every day | ORAL | Status: DC

## 2017-11-28 MED ORDER — HYDROCODONE-ACETAMINOPHEN 7.5-325 MG PO TABS
1.0000 | ORAL_TABLET | Freq: Once | ORAL | Status: DC | PRN
Start: 2017-11-28 — End: 2017-11-28

## 2017-11-28 MED ORDER — AMLODIPINE BESYLATE 10 MG PO TABS
10.0000 mg | ORAL_TABLET | Freq: Once | ORAL | Status: AC
  Administered 2017-11-28: 10 mg via ORAL
  Filled 2017-11-28: qty 1

## 2017-11-28 MED ORDER — HYDROMORPHONE HCL 1 MG/ML IJ SOLN
0.5000 mg | INTRAMUSCULAR | Status: DC | PRN
  Administered 2017-11-28: 1 mg via INTRAVENOUS
  Filled 2017-11-28: qty 1

## 2017-11-28 MED ORDER — PROPOFOL 10 MG/ML IV BOLUS
INTRAVENOUS | Status: AC
  Filled 2017-11-28: qty 20

## 2017-11-28 MED ORDER — LIDOCAINE 2% (20 MG/ML) 5 ML SYRINGE
INTRAMUSCULAR | Status: AC
  Filled 2017-11-28: qty 5

## 2017-11-28 MED ORDER — MIDAZOLAM HCL 5 MG/5ML IJ SOLN
INTRAMUSCULAR | Status: DC | PRN
  Administered 2017-11-28: 2 mg via INTRAVENOUS

## 2017-11-28 MED ORDER — ALUM & MAG HYDROXIDE-SIMETH 200-200-20 MG/5ML PO SUSP: 30.0000 mL | ORAL | Status: DC | PRN

## 2017-11-28 MED ORDER — SODIUM CHLORIDE 0.9 % IV SOLN
INTRAVENOUS | Status: DC
  Administered 2017-11-28 – 2017-11-29 (×2): via INTRAVENOUS

## 2017-11-28 MED ORDER — PROPOFOL 10 MG/ML IV BOLUS
INTRAVENOUS | Status: AC
  Filled 2017-11-28: qty 60

## 2017-11-28 MED ORDER — PROPOFOL 10 MG/ML IV BOLUS
INTRAVENOUS | Status: DC | PRN
  Administered 2017-11-28 (×3): 20 mg via INTRAVENOUS
  Administered 2017-11-28: 30 mg via INTRAVENOUS
  Administered 2017-11-28: 20 mg via INTRAVENOUS
  Administered 2017-11-28: 10 mg via INTRAVENOUS
  Administered 2017-11-28: 20 mg via INTRAVENOUS

## 2017-11-28 MED ORDER — DULOXETINE HCL 30 MG PO CPEP
30.0000 mg | ORAL_CAPSULE | Freq: Two times a day (BID) | ORAL | Status: DC
  Administered 2017-11-28 – 2017-12-01 (×6): 30 mg via ORAL
  Filled 2017-11-28 (×6): qty 1

## 2017-11-28 MED ORDER — POLYETHYLENE GLYCOL 3350 17 G PO PACK
17.0000 g | PACK | Freq: Every day | ORAL | Status: DC | PRN
  Filled 2017-11-28: qty 1

## 2017-11-28 MED ORDER — ACETAMINOPHEN 10 MG/ML IV SOLN
1000.0000 mg | Freq: Once | INTRAVENOUS | Status: DC | PRN
  Administered 2017-11-28: 1000 mg via INTRAVENOUS

## 2017-11-28 MED ORDER — LACTATED RINGERS IV SOLN
INTRAVENOUS | Status: DC
  Administered 2017-11-28 (×3): via INTRAVENOUS

## 2017-11-28 MED ORDER — ZOLPIDEM TARTRATE 5 MG PO TABS
5.0000 mg | ORAL_TABLET | Freq: Every evening | ORAL | Status: DC | PRN
  Administered 2017-11-29: 5 mg via ORAL
  Filled 2017-11-28: qty 1

## 2017-11-28 MED ORDER — 0.9 % SODIUM CHLORIDE (POUR BTL) OPTIME
TOPICAL | Status: DC | PRN
  Administered 2017-11-28: 1000 mL

## 2017-11-28 MED ORDER — PANTOPRAZOLE SODIUM 40 MG PO TBEC
40.0000 mg | DELAYED_RELEASE_TABLET | Freq: Every day | ORAL | Status: DC
  Administered 2017-11-29 – 2017-12-01 (×3): 40 mg via ORAL
  Filled 2017-11-28 (×3): qty 1

## 2017-11-28 MED ORDER — STERILE WATER FOR IRRIGATION IR SOLN
Status: DC | PRN
  Administered 2017-11-28: 2000 mL

## 2017-11-28 MED ORDER — PROPOFOL 500 MG/50ML IV EMUL
INTRAVENOUS | Status: DC | PRN
  Administered 2017-11-28: 50 ug/kg/min via INTRAVENOUS

## 2017-11-28 MED ORDER — AMLODIPINE BESYLATE 10 MG PO TABS
10.0000 mg | ORAL_TABLET | Freq: Every day | ORAL | Status: DC
  Administered 2017-11-29 – 2017-12-01 (×3): 10 mg via ORAL
  Filled 2017-11-28 (×3): qty 1

## 2017-11-28 MED ORDER — ACETAMINOPHEN 10 MG/ML IV SOLN
INTRAVENOUS | Status: AC
  Filled 2017-11-28: qty 100

## 2017-11-28 MED ORDER — METHOCARBAMOL 500 MG PO TABS
500.0000 mg | ORAL_TABLET | Freq: Four times a day (QID) | ORAL | Status: DC | PRN
  Administered 2017-11-28 – 2017-12-01 (×9): 500 mg via ORAL
  Filled 2017-11-28 (×9): qty 1

## 2017-11-28 MED ORDER — MENTHOL 3 MG MT LOZG: 1.0000 | LOZENGE | OROMUCOSAL | Status: DC | PRN

## 2017-11-28 MED ORDER — ONDANSETRON HCL 4 MG/2ML IJ SOLN
INTRAMUSCULAR | Status: AC
  Filled 2017-11-28: qty 2

## 2017-11-28 MED ORDER — ONDANSETRON HCL 4 MG PO TABS: 4.0000 mg | ORAL_TABLET | Freq: Four times a day (QID) | ORAL | Status: DC | PRN

## 2017-11-28 MED ORDER — PHENYLEPHRINE 40 MCG/ML (10ML) SYRINGE FOR IV PUSH (FOR BLOOD PRESSURE SUPPORT)
PREFILLED_SYRINGE | INTRAVENOUS | Status: DC | PRN
  Administered 2017-11-28 (×3): 80 ug via INTRAVENOUS
  Administered 2017-11-28: 40 ug via INTRAVENOUS

## 2017-11-28 MED ORDER — ONDANSETRON HCL 4 MG/2ML IJ SOLN: 4.0000 mg | Freq: Four times a day (QID) | INTRAMUSCULAR | Status: DC | PRN

## 2017-11-28 MED ORDER — PROMETHAZINE HCL 25 MG/ML IJ SOLN: 6.2500 mg | INTRAMUSCULAR | Status: DC | PRN

## 2017-11-28 MED ORDER — BUPIVACAINE IN DEXTROSE 0.75-8.25 % IT SOLN
INTRATHECAL | Status: DC | PRN
  Administered 2017-11-28: 1.6 mL via INTRATHECAL
  Administered 2017-11-28: 2 mL via INTRATHECAL

## 2017-11-28 MED ORDER — ONDANSETRON HCL 4 MG/2ML IJ SOLN
INTRAMUSCULAR | Status: DC | PRN
  Administered 2017-11-28 (×2): 2 mg via INTRAVENOUS

## 2017-11-28 SURGICAL SUPPLY — 45 items
APL SKNCLS STERI-STRIP NONHPOA (GAUZE/BANDAGES/DRESSINGS)
BAG SPEC THK2 15X12 ZIP CLS (MISCELLANEOUS)
BAG ZIPLOCK 12X15 (MISCELLANEOUS) IMPLANT
BENZOIN TINCTURE PRP APPL 2/3 (GAUZE/BANDAGES/DRESSINGS) IMPLANT
BLADE SAW SGTL 18X1.27X75 (BLADE) ×2 IMPLANT
BLADE SAW SGTL 18X1.27X75MM (BLADE) ×1
CAPT HIP TOTAL 2 ×2 IMPLANT
CLOSURE WOUND 1/2 X4 (GAUZE/BANDAGES/DRESSINGS)
COVER PERINEAL POST (MISCELLANEOUS) ×3 IMPLANT
COVER SURGICAL LIGHT HANDLE (MISCELLANEOUS) ×3 IMPLANT
DRAPE STERI IOBAN 125X83 (DRAPES) ×3 IMPLANT
DRAPE U-SHAPE 47X51 STRL (DRAPES) ×6 IMPLANT
DRSG AQUACEL AG ADV 3.5X10 (GAUZE/BANDAGES/DRESSINGS) ×3 IMPLANT
DURAPREP 26ML APPLICATOR (WOUND CARE) ×3 IMPLANT
ELECT REM PT RETURN 15FT ADLT (MISCELLANEOUS) ×3 IMPLANT
GAUZE XEROFORM 1X8 LF (GAUZE/BANDAGES/DRESSINGS) ×2 IMPLANT
GLOVE BIO SURGEON STRL SZ7.5 (GLOVE) ×5 IMPLANT
GLOVE BIOGEL PI IND STRL 7.0 (GLOVE) IMPLANT
GLOVE BIOGEL PI IND STRL 8 (GLOVE) ×2 IMPLANT
GLOVE BIOGEL PI IND STRL 9 (GLOVE) IMPLANT
GLOVE BIOGEL PI INDICATOR 7.0 (GLOVE) ×6
GLOVE BIOGEL PI INDICATOR 8 (GLOVE) ×6
GLOVE BIOGEL PI INDICATOR 9 (GLOVE) ×2
GLOVE ECLIPSE 8.0 STRL XLNG CF (GLOVE) ×3 IMPLANT
GLOVE ECLIPSE 8.5 STRL (GLOVE) ×2 IMPLANT
GLOVE SURG SS PI 8.0 STRL IVOR (GLOVE) ×2 IMPLANT
GOWN SPEC L3 XXLG W/TWL (GOWN DISPOSABLE) ×2 IMPLANT
GOWN STRL REIN 3XL XLG LVL4 (GOWN DISPOSABLE) ×2 IMPLANT
GOWN STRL REUS W/TWL LRG LVL3 (GOWN DISPOSABLE) ×2 IMPLANT
GOWN STRL REUS W/TWL XL LVL3 (GOWN DISPOSABLE) ×6 IMPLANT
HANDPIECE INTERPULSE COAX TIP (DISPOSABLE) ×3
HOLDER FOLEY CATH W/STRAP (MISCELLANEOUS) ×3 IMPLANT
PACK ANTERIOR HIP CUSTOM (KITS) ×3 IMPLANT
SET HNDPC FAN SPRY TIP SCT (DISPOSABLE) ×1 IMPLANT
STAPLER VISISTAT 35W (STAPLE) ×2 IMPLANT
STRIP CLOSURE SKIN 1/2X4 (GAUZE/BANDAGES/DRESSINGS) IMPLANT
SUT ETHIBOND NAB CT1 #1 30IN (SUTURE) ×3 IMPLANT
SUT MNCRL AB 4-0 PS2 18 (SUTURE) IMPLANT
SUT VIC AB 0 CT1 36 (SUTURE) ×3 IMPLANT
SUT VIC AB 1 CT1 36 (SUTURE) ×3 IMPLANT
SUT VIC AB 2-0 CT1 27 (SUTURE) ×6
SUT VIC AB 2-0 CT1 TAPERPNT 27 (SUTURE) ×2 IMPLANT
TRAY FOLEY CATH 14FRSI W/METER (CATHETERS) ×2 IMPLANT
TRAY FOLEY MTR SLVR 16FR STAT (SET/KITS/TRAYS/PACK) ×1 IMPLANT
YANKAUER SUCT BULB TIP 10FT TU (MISCELLANEOUS) ×3 IMPLANT

## 2017-11-28 NOTE — Brief Op Note (Signed)
11/28/2017  10:22 AM  PATIENT:  Rachel Warren  42 y.o. female  PRE-OPERATIVE DIAGNOSIS:  osteoarthritis right hip  POST-OPERATIVE DIAGNOSIS:  osteoarthritis right hip  PROCEDURE:  Procedure(s): RIGHT TOTAL HIP ARTHROPLASTY ANTERIOR APPROACH (Right)  SURGEON:  Surgeon(s) and Role:    Mcarthur Rossetti, MD - Primary  PHYSICIAN ASSISTANT: Benita Stabile, PA-C  ANESTHESIA:   spinal  EBL:  900 mL   COUNTS:  YES  DICTATION: .Other Dictation: Dictation Number [id number]  PLAN OF CARE: Admit to inpatient   PATIENT DISPOSITION:  PACU - hemodynamically stable.   Delay start of Pharmacological VTE agent (>24hrs) due to surgical blood loss or risk of bleeding: no

## 2017-11-28 NOTE — Anesthesia Procedure Notes (Signed)
Procedure Name: MAC Date/Time: 11/28/2017 8:43 AM Performed by: Deliah Boston, CRNA Pre-anesthesia Checklist: Patient identified, Emergency Drugs available, Suction available, Patient being monitored and Timeout performed Patient Re-evaluated:Patient Re-evaluated prior to induction Oxygen Delivery Method: Simple face mask Placement Confirmation: positive ETCO2 and CO2 detector

## 2017-11-28 NOTE — Anesthesia Postprocedure Evaluation (Signed)
Anesthesia Post Note  Patient: Rachel Warren  Procedure(s) Performed: RIGHT TOTAL HIP ARTHROPLASTY ANTERIOR APPROACH (Right Hip)     Patient location during evaluation: PACU Anesthesia Type: Spinal Level of consciousness: oriented and awake and alert Pain management: pain level controlled Vital Signs Assessment: post-procedure vital signs reviewed and stable Respiratory status: spontaneous breathing, respiratory function stable and patient connected to nasal cannula oxygen Cardiovascular status: blood pressure returned to baseline and stable Postop Assessment: no headache, no backache and no apparent nausea or vomiting Anesthetic complications: no    Last Vitals:  Vitals:   11/28/17 1152 11/28/17 1245  BP: 133/84 133/86  Pulse: 73 92  Resp: 16 16  Temp: 36.9 C 36.8 C  SpO2: 100% 100%    Last Pain:  Vitals:   11/28/17 1245  TempSrc: Oral  PainSc:                  Barnet Glasgow

## 2017-11-28 NOTE — Transfer of Care (Signed)
Immediate Anesthesia Transfer of Care Note  Patient: Rachel Warren  Procedure(s) Performed: Procedure(s): RIGHT TOTAL HIP ARTHROPLASTY ANTERIOR APPROACH (Right)  Patient Location: PACU  Anesthesia Type:MAC and Spinal  Level of Consciousness: Patient easily awoken, sedated, comfortable, cooperative, following commands, responds to stimulation.   Airway & Oxygen Therapy: Patient spontaneously breathing, ventilating well, oxygen via simple oxygen mask.  Post-op Assessment: Report given to PACU RN, vital signs reviewed and stable.   Post vital signs: Reviewed and stable.  Complications: No apparent anesthesia complications Last Vitals:  Vitals Value Taken Time  BP 109/81 11/28/2017 10:40 AM  Temp    Pulse 77 11/28/2017 10:45 AM  Resp 18 11/28/2017 10:45 AM  SpO2 100 % 11/28/2017 10:45 AM  Vitals shown include unvalidated device data.  Last Pain:  Vitals:   11/28/17 0659  TempSrc:   PainSc: 5          Complications: No apparent anesthesia complications

## 2017-11-28 NOTE — H&P (Signed)
TOTAL HIP ADMISSION H&P  Patient is admitted for right total hip arthroplasty.  Subjective:  Chief Complaint: right hip pain  HPI: Rachel Warren, 42 y.o. female, has a history of pain and functional disability in the right hip(s) due to arthritis and patient has failed non-surgical conservative treatments for greater than 12 weeks to include NSAID's and/or analgesics, corticosteriod injections, use of assistive devices, weight reduction as appropriate and activity modification.  Onset of symptoms was gradual starting 10 years ago with gradually worsening course since that time.The patient noted no past surgery on the right hip(s).  Patient currently rates pain in the right hip at 10 out of 10 with activity. Patient has night pain, worsening of pain with activity and weight bearing, trendelenberg gait, pain that interfers with activities of daily living, pain with passive range of motion and crepitus. Patient has evidence of subchondral cysts, subchondral sclerosis, periarticular osteophytes, joint subluxation and joint space narrowing by imaging studies. This condition presents safety issues increasing the risk of falls.  There is no current active infection.  Patient Active Problem List   Diagnosis Date Noted  . Unilateral primary osteoarthritis, left hip 11/06/2017  . Unilateral primary osteoarthritis, right hip 11/06/2017  . Lumbosacral radiculopathy at S1 09/12/2017  . Back pain 08/19/2017  . 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  . Abnormal gait 08/19/2017  . Obesity 08/19/2017  . Ventral hernia s/p laparoscopic repair with mesh 02/27/15 02/27/2015   Past Medical History:  Diagnosis Date  . Anemia   . Arthritis   . Back pain    lower back   . Complication of anesthesia    age 50 had twilight anesthesia and woke up suring surgery   . Depression   . Hypertension   . Pinched nerve    in back    Past Surgical History:  Procedure Laterality Date  . benign breast cyst removed     . CHOLECYSTECTOMY    . INSERTION OF MESH N/A 02/27/2015   Procedure: INSERTION OF MESH;  Surgeon: Jackolyn Confer, MD;  Location: [facility];  Service: General;  Laterality: N/A;  . LAPAROSCOPIC ASSISTED VENTRAL HERNIA REPAIR N/A 02/27/2015   Procedure: LAPAROSCOPIC VENTRAL HERNIA REPAIR WITH MESH;  Surgeon: Jackolyn Confer, MD;  Location: [facility];  Service: General;  Laterality: N/A;  . THYROIDECTOMY, PARTIAL      Current Facility-Administered Medications  Medication Dose Route Frequency Provider Last Rate Last Dose  . amLODipine (NORVASC) tablet 10 mg  10 mg Oral Once Barnet Glasgow, MD      . ceFAZolin (ANCEF) IVPB 2g/100 mL premix  2 g Intravenous On Call to OR Pete Pelt, PA-C      . chlorhexidine (HIBICLENS) 4 % liquid 4 application  60 mL Topical Once Erskine Emery W, PA-C      . lactated ringers infusion   Intravenous Continuous Hatchett, Mateo Flow, MD      . tranexamic acid (CYKLOKAPRON) 1,000 mg in sodium chloride 0.9 % 100 mL IVPB  1,000 mg Intravenous To OR Mcarthur Rossetti, MD       No Known Allergies  Social History   Tobacco Use  . Smoking status: Former Smoker    Types: Cigarettes    Last attempt to quit: 06/14/2017    Years since quitting: 0.4  . Smokeless tobacco: Never Used  Substance Use Topics  . Alcohol use: Yes    Comment: occasionally    History reviewed. No pertinent family history.   Review of Systems  Musculoskeletal:  Positive for joint pain.  All other systems reviewed and are negative.   Objective:  Physical Exam  Constitutional: She is oriented to person, place, and time. She appears well-developed and well-nourished.  HENT:  Head: Normocephalic and atraumatic.  Eyes: EOM are normal.  Neck: Normal range of motion. Neck supple.  Cardiovascular: Normal rate and regular rhythm.  Respiratory: Effort normal and breath sounds normal.  GI: Soft. Bowel sounds are normal.  Neurological: She is alert and oriented to person, place, and time.   Skin: Skin is warm and dry.  Psychiatric: She has a normal mood and affect.    Vital signs in last 24 hours: Temp:  [99.6 F (37.6 C)] 99.6 F (37.6 C) (05/17 0624) Pulse Rate:  [86] 86 (05/17 0624) Resp:  [16] 16 (05/17 0624) BP: (149)/(99) 149/99 (05/17 0624) SpO2:  [100 %] 100 % (05/17 0624) Weight:  [246 lb (111.6 kg)] 246 lb (111.6 kg) (05/17 0659)  Labs:   Estimated body mass index is 37.4 kg/m as calculated from the following:   Height as of this encounter: 5\' 8"  (1.727 m).   Weight as of this encounter: 246 lb (111.6 kg).   Imaging Review Plain radiographs demonstrate severe degenerative joint disease of the right hip(s). The bone quality appears to be good for age and reported activity level.    Preoperative templating of the joint replacement has been completed, documented, and submitted to the Operating Room personnel in order to optimize intra-operative equipment management    Assessment/Plan:  End stage arthritis, right hip(s)  The patient history, physical examination, clinical judgement of the provider and imaging studies are consistent with end stage degenerative joint disease of the right hip(s) and total hip arthroplasty is deemed medically necessary. The treatment options including medical management, injection therapy, arthroscopy and arthroplasty were discussed at length. The risks and benefits of total hip arthroplasty were presented and reviewed. The risks due to aseptic loosening, infection, stiffness, dislocation/subluxation,  thromboembolic complications and other imponderables were discussed.  The patient acknowledged the explanation, agreed to proceed with the plan and consent was signed. Patient is being admitted for inpatient treatment for surgery, pain control, PT, OT, prophylactic antibiotics, VTE prophylaxis, progressive ambulation and ADL's and discharge planning.The patient is planning to be discharged home with home health services

## 2017-11-28 NOTE — Anesthesia Procedure Notes (Signed)
Spinal  Patient location during procedure: OR Start time: 11/28/2017 8:44 AM End time: 11/28/2017 8:53 AM Staffing Anesthesiologist: Barnet Glasgow, MD Spinal Block Patient position: sitting Prep: ChloraPrep Patient monitoring: heart rate Approach: midline Location: L4-5 Needle Needle type: Pencil-Tip and Pencan  Needle gauge: 24 G Needle length: 10 cm Needle insertion depth: 7 cm Assessment Sensory level: T12

## 2017-11-28 NOTE — Op Note (Signed)
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  PROCEDURE PERFORMED:  Right total hip arthroplasty through direct anterior approach.  IMPLANTS:  DePuy Sector Gription acetabular component size 52, a single screw, size 36+0 neutral polyethylene liner for a size 52 acetabular component, size 36+5 ceramic hip ball, size 10 Corail femoral component with varus offset.  SURGEON:  Lind Guest. Ninfa Linden, MD  ASSISTANT:  Benita Stabile, PA-C   ANESTHESIA:  Spinal.  ANTIBIOTICS:  Two grams IV Ancef.  ESTIMATED BLOOD LOSS:  Less than 500 mL.  COMPLICATIONS:  None.  INDICATIONS:  The patient is a 42 year old female with debilitating arthritis involving both her hips.  The right side is significantly deformed with a subluxed hip and significant shortening on that side, and I think it is more of a shallow acetabulum.   She is only 42 years old.  She is significantly obese, and this has been bothering her quite a bit for a long period of time.  She walks with a significant Trendelenburg gait as well.  At this point, she does wish to proceed with a total hip  arthroplasty on the right side.  I talked to her in detail about the risk of acute blood loss anemia, neurovascular injury, fracture, infection, dislocation, and DVT.  She understands her goals are to decrease pain and improve mobility and overall  improve quality of life.  DESCRIPTION OF PROCEDURE:  After informed  consent was obtained, appropriate right hip was marked.  She was brought to the operating room where spinal anesthesia was obtained while she was on a stretcher.  She was then laid in a supine position on the  stretcher.  A Foley catheter was placed in both feet, and traction boots applied to them.  Next, she was placed supine on the Hana fracture table with a perineal post in place, both legs in in-line skeletal traction device with no traction applied.  Her  right operative hip was prepped and draped with DuraPrep and sterile drapes.  Timeout was called to identify the correct patient and patient's correct right hip.  We then made an incision just inferior and posterior to the anterior superior iliac spine  and carried this obliquely down the leg.  We dissected down the tensor fascia lata muscle.  The tensor fascia was then divided longitudinally to proceed with a right anterior approach to the hip.  We identified and cauterized the circumflex vessels.  I  then identified the hip capsule and opened up the hip capsule in an L-type format, finding a large joint effusion and significant deformed right hip.  We placed Corail retractors around the medial and lateral femoral neck and then made a femoral neck cut  proximal to the lesser trochanter with an oscillating saw and completed this with an osteotome.  We placed a corkscrew guide in the femoral head and removed the femoral head in its entirety and found it to be completely deformed and flattened as well.   We then cleaned the acetabulum, removed the acetabular labrum and other debris.  We found somewhat of a pseudoacetabulum, and we began reaming under  direct visualization to centralize the cup and bring it down some more.  We went from a size 43, all the  way up to a size 51 with all removed under direct visualization, the last reamer under direct fluoroscopy.  The second really obtained good positioning of the inclination and depth of reaming.  I then  placed the real DePuy Sector Gription acetabular  component size 52 and got a good bite with this.  I still placed a single screw based on the overhang.  I then turned attention to the femur with the leg externally rotated 120 degrees, extended and adducted.  We placed a Mueller retractor medially and a  Hohmann retractor around the greater trochanter, released joint capsule and used a box-cutting osteotome in the femoral canal and rongeur to lateralize and then began broaching from a size 8 broach, going up to only a size 10 due to her thick bone and  tight canal.  With a size 10 in place, we trialed various offset femoral necks and went with a 36+5.  The hip ball reduced this acetabulum.  Pleased with leg length, offset, range of motion and stability.  We then dislocated the hip and removed the trial  components.  We placed the real Corail femoral component with a varus offset size 10 and the real 36+5 ceramic hip ball reducing the acetabulum.  Again, we appreciated the stability.  We then irrigated the soft tissues with normal saline solution using  pulsatile lavage.  There was not any significant joint capsule to close.  We closed the iliotibial band with a running #1 Vicryl suture followed by 0 Vicryl in the deep tissues, 2-0 Vicryl for subcutaneous tissue and interrupted staples in the skin.   Xeroform and an Aquacel dressing was applied.  She was taken off the Hana table, taken to recovery room in stable condition.  All final counts were correct.  No complications noted.  Benita Stabile, PA-C, assisted in the entire case.  His assistance was  crucial for facilitating all aspects of this case.  LN/NUANCE  D:11/28/2017 T:11/28/2017 JOB:[id number]

## 2017-11-28 NOTE — Evaluation (Signed)
Physical Therapy Evaluation Patient Details Name: Rachel Warren MRN: [id number] DOB: 1975-07-28 Today's Date: 11/28/2017   History of Present Illness  Pt s/p R THR  Clinical Impression  Pt s/p R THR and presents with decreased R LE strength/ROM and post op pain limiting functional mobility.  Pt should progress to dc home with family assist.    Follow Up Recommendations Follow surgeon's recommendation for DC plan and follow-up therapies    Equipment Recommendations  Rolling walker with 5" wheels;3in1 (PT)    Recommendations for Other Services       Precautions / Restrictions Precautions Precautions: Fall Restrictions Weight Bearing Restrictions: No Other Position/Activity Restrictions: WBAT      Mobility  Bed Mobility Overal bed mobility: Needs Assistance Bed Mobility: Supine to Sit     Supine to sit: Min assist     General bed mobility comments: cues for sequence and use of L LE to self assist  Transfers Overall transfer level: Needs assistance Equipment used: Rolling walker (2 wheeled) Transfers: Sit to/from Stand Sit to Stand: Min assist         General transfer comment: cues for LE management and use of UEs to self assist  Ambulation/Gait Ambulation/Gait assistance: Min assist;+2 safety/equipment Ambulation Distance (Feet): 11 Feet Assistive device: Rolling walker (2 wheeled) Gait Pattern/deviations: Step-to pattern;Decreased step length - right;Decreased step length - left;Shuffle Gait velocity: decr   General Gait Details: cues for sequence, posture and position from RW.  Distance ltd by onset dizziness  Stairs            Wheelchair Mobility    Modified Rankin (Stroke Patients Only)       Balance Overall balance assessment: Needs assistance Sitting-balance support: No upper extremity supported;Feet supported Sitting balance-Leahy Scale: Good     Standing balance support: Bilateral upper extremity supported Standing  balance-Leahy Scale: Poor                               Pertinent Vitals/Pain Pain Assessment: 0-10 Pain Score: 3  Pain Location: R hip Pain Descriptors / Indicators: Aching;Sore Pain Intervention(s): Limited activity within patient's tolerance;Monitored during session;Premedicated before session;Ice applied    Home Living Family/patient expects to be discharged to:: Private residence Living Arrangements: Spouse/significant other Available Help at Discharge: Family Type of Home: House Home Access: Stairs to enter   Technical brewer of Steps: 1 Home Layout: One level Home Equipment: None      Prior Function Level of Independence: Independent               Hand Dominance        Extremity/Trunk Assessment   Upper Extremity Assessment Upper Extremity Assessment: Overall WFL for tasks assessed    Lower Extremity Assessment Lower Extremity Assessment: RLE deficits/detail    Cervical / Trunk Assessment Cervical / Trunk Assessment: Normal  Communication   Communication: No difficulties  Cognition Arousal/Alertness: Awake/alert Behavior During Therapy: WFL for tasks assessed/performed Overall Cognitive Status: Within Functional Limits for tasks assessed                                        General Comments      Exercises Total Joint Exercises Ankle Circles/Pumps: AROM;Both;15 reps;Supine   Assessment/Plan    PT Assessment Patient needs continued PT services  PT Problem List Decreased strength;Decreased range of motion;Decreased activity  tolerance;Decreased balance;Decreased mobility;Decreased knowledge of use of DME;Obesity;Pain       PT Treatment Interventions DME instruction;Gait training;Stair training;Functional mobility training;Therapeutic activities;Therapeutic exercise;Patient/family education    PT Goals (Current goals can be found in the Care Plan section)  Acute Rehab PT Goals Patient Stated Goal: Regain  IND PT Goal Formulation: With patient Time For Goal Achievement: 12/05/17 Potential to Achieve Goals: Good    Frequency 7X/week   Barriers to discharge        Co-evaluation               AM-PAC PT "6 Clicks" Daily Activity  Outcome Measure Difficulty turning over in bed (including adjusting bedclothes, sheets and blankets)?: Unable Difficulty moving from lying on back to sitting on the side of the bed? : Unable Difficulty sitting down on and standing up from a chair with arms (e.g., wheelchair, bedside commode, etc,.)?: Unable Help needed moving to and from a bed to chair (including a wheelchair)?: A Little Help needed walking in hospital room?: A Little Help needed climbing 3-5 steps with a railing? : A Lot 6 Click Score: 11    End of Session Equipment Utilized During Treatment: Gait belt Activity Tolerance: Patient tolerated treatment well Patient left: in chair;with call bell/phone within reach;with chair alarm set Nurse Communication: Mobility status PT Visit Diagnosis: Difficulty in walking, not elsewhere classified (R26.2)    Time: 6945-0388 PT Time Calculation (min) (ACUTE ONLY): 22 min   Charges:   PT Evaluation $PT Eval Low Complexity: 1 Low     PT G Codes:        Pg [phone]   Daijha Leggio 11/28/2017, 5:17 PM

## 2017-11-28 NOTE — Anesthesia Procedure Notes (Addendum)
Spinal  Patient location during procedure: OR Start time: 11/28/2017 9:00 AM End time: 11/28/2017 9:07 AM Staffing Anesthesiologist: Barnet Glasgow, MD Performed: anesthesiologist  Preanesthetic Checklist Completed: patient identified, surgical consent, pre-op evaluation, timeout performed, IV checked, risks and benefits discussed and monitors and equipment checked Spinal Block Patient position: sitting Prep: site prepped and draped and DuraPrep Patient monitoring: heart rate, cardiac monitor, continuous pulse ox and blood pressure Approach: midline Location: L3-4 Injection technique: single-shot Needle Needle type: Pencan  Needle gauge: 24 G Needle length: 10 cm Needle insertion depth: 8 cm Assessment Sensory level: T4

## 2017-11-29 LAB — BASIC METABOLIC PANEL
Anion gap: 9 (ref 5–15)
BUN: 7 mg/dL (ref 6–20)
CO2: 24 mmol/L (ref 22–32)
Calcium: 8.4 mg/dL — ABNORMAL LOW (ref 8.9–10.3)
Chloride: 105 mmol/L (ref 101–111)
Creatinine, Ser: 0.6 mg/dL (ref 0.44–1.00)
GFR calc non Af Amer: >60 mL/min (ref >60)
Glucose, Bld: 114 mg/dL — ABNORMAL HIGH (ref 65–99)
POTASSIUM: 3.5 mmol/L (ref 3.5–5.1)
SODIUM: 138 mmol/L (ref 135–145)

## 2017-11-29 LAB — CBC
HCT: 23.9 % — ABNORMAL LOW (ref 36.0–46.0)
HEMOGLOBIN: 7.1 g/dL — AB (ref 12.0–15.0)
MCH: 20.8 pg — ABNORMAL LOW (ref 26.0–34.0)
MCHC: 29.7 g/dL — ABNORMAL LOW (ref 30.0–36.0)
MCV: 70.1 fL — ABNORMAL LOW (ref 78.0–100.0)
Platelets: 239 10*3/uL (ref 150–400)
RBC: 3.41 MIL/uL — AB (ref 3.87–5.11)
RDW: 20.8 % — ABNORMAL HIGH (ref 11.5–15.5)
WBC: 7.3 10*3/uL (ref 4.0–10.5)

## 2017-11-29 LAB — PREPARE RBC (CROSSMATCH)

## 2017-11-29 MED ORDER — OXYCODONE HCL 5 MG PO TABS: 5.0000 mg | ORAL_TABLET | ORAL | 0 refills | Status: DC | PRN

## 2017-11-29 MED ORDER — ASPIRIN 81 MG PO CHEW: 81.0000 mg | CHEWABLE_TABLET | Freq: Two times a day (BID) | ORAL | 0 refills | Status: DC

## 2017-11-29 MED ORDER — SODIUM CHLORIDE 0.9 % IV SOLN: Freq: Once | INTRAVENOUS | Status: DC

## 2017-11-29 MED ORDER — METHOCARBAMOL 500 MG PO TABS: 500.0000 mg | ORAL_TABLET | Freq: Four times a day (QID) | ORAL | 0 refills | Status: DC | PRN

## 2017-11-29 NOTE — Discharge Instructions (Signed)

## 2017-11-29 NOTE — Progress Notes (Signed)
Subjective: 1 Day Post-Op Procedure(s) (LRB): RIGHT TOTAL HIP ARTHROPLASTY ANTERIOR APPROACH (Right) Patient reports pain as moderate.  Acute on chronic blood loss anemia.  Objective: Vital signs in last 24 hours: Temp:  [98.1 F (36.7 C)-98.5 F (36.9 C)] 98.2 F (36.8 C) (05/18 0945) Pulse Rate:  [66-96] 92 (05/18 0945) Resp:  [15-20] 16 (05/18 0945) BP: (104-138)/(74-94) 123/75 (05/18 0945) SpO2:  [100 %] 100 % (05/18 0945)  Intake/Output from previous day: 05/17 0701 - 05/18 0700 In: 4249.8 [I.V.:3894.8; IV Piggyback:355] Out: 5681 [Urine:3150; Blood:900] Intake/Output this shift: Total I/O In: 240 [P.O.:240] Out: 350 [Urine:350]  Recent Labs    11/29/17 0517  HGB 7.1*   Recent Labs    11/29/17 0517  WBC 7.3  RBC 3.41*  HCT 23.9*  PLT 239   Recent Labs    11/29/17 0517  NA 138  K 3.5  CL 105  CO2 24  BUN 7  CREATININE 0.60  GLUCOSE 114*  CALCIUM 8.4*   No results for input(s): LABPT, INR in the last 72 hours.  Sensation intact distally Intact pulses distally Dorsiflexion/Plantar flexion intact Incision: scant drainage  Assessment/Plan: 1 Day Post-Op Procedure(s) (LRB): RIGHT TOTAL HIP ARTHROPLASTY ANTERIOR APPROACH (Right) Up with therapy  Transfuse 2 units of blood Likely discharge on Monday unless does very well with mobility after therapy and transfusion.    Mcarthur Rossetti 11/29/2017, 10:16 AM

## 2017-11-29 NOTE — Progress Notes (Signed)
Physical Therapy Treatment Patient Details Name: Rachel Warren MRN: [id number] DOB: 08-01-1975 Today's Date: 11/29/2017    History of Present Illness Pt s/p R THR    PT Comments    Initiated therex program.  OOB deferred, pt with Hgb 7.1 and transfusion in progress.   Follow Up Recommendations  Follow surgeon's recommendation for DC plan and follow-up therapies     Equipment Recommendations  Rolling walker with 5" wheels;3in1 (PT)    Recommendations for Other Services OT consult     Precautions / Restrictions Precautions Precautions: Fall Restrictions Weight Bearing Restrictions: No Other Position/Activity Restrictions: WBAT    Mobility  Bed Mobility               General bed mobility comments: NT - blood transfusion initiated with Hgb 7.1  Transfers                    Ambulation/Gait                 Stairs             Wheelchair Mobility    Modified Rankin (Stroke Patients Only)       Balance                                            Cognition Arousal/Alertness: Awake/alert Behavior During Therapy: WFL for tasks assessed/performed Overall Cognitive Status: Within Functional Limits for tasks assessed                                        Exercises Total Joint Exercises Ankle Circles/Pumps: AROM;Both;15 reps;Supine Quad Sets: AROM;Both;10 reps;Supine Heel Slides: AAROM;Right;20 reps;Supine Hip ABduction/ADduction: AAROM;Right;15 reps;Supine    General Comments        Pertinent Vitals/Pain Pain Assessment: 0-10 Pain Score: 4  Pain Location: R hip Pain Descriptors / Indicators: Aching;Sore Pain Intervention(s): Limited activity within patient's tolerance;Monitored during session;Premedicated before session;Ice applied    Home Living                      Prior Function            PT Goals (current goals can now be found in the care plan section) Acute Rehab  PT Goals Patient Stated Goal: Regain IND PT Goal Formulation: With patient Time For Goal Achievement: 12/05/17 Potential to Achieve Goals: Good Progress towards PT goals: Progressing toward goals    Frequency    7X/week      PT Plan Current plan remains appropriate    Co-evaluation              AM-PAC PT "6 Clicks" Daily Activity  Outcome Measure  Difficulty turning over in bed (including adjusting bedclothes, sheets and blankets)?: Unable Difficulty moving from lying on back to sitting on the side of the bed? : Unable Difficulty sitting down on and standing up from a chair with arms (e.g., wheelchair, bedside commode, etc,.)?: Unable Help needed moving to and from a bed to chair (including a wheelchair)?: A Little Help needed walking in hospital room?: A Little Help needed climbing 3-5 steps with a railing? : A Lot 6 Click Score: 11    End of Session   Activity Tolerance: Patient tolerated treatment well Patient left: in  bed;with call bell/phone within reach;with family/visitor present Nurse Communication: Mobility status PT Visit Diagnosis: Difficulty in walking, not elsewhere classified (R26.2)     Time: 3013-1438 PT Time Calculation (min) (ACUTE ONLY): 22 min  Charges:  $Therapeutic Exercise: 8-22 mins                    G Codes:       Pg [phone]    Bradley Handyside 11/29/2017, 11:53 AM

## 2017-11-29 NOTE — Progress Notes (Signed)
Patient ID: Rachel Warren, female   DOB: 1975/10/14, 42 y.o.   MRN: [id number] The patient has acute on chronic blood loss anemia.  Her pre-op hgb was only 9.4.  She is down to 7.1 and has normal vitals, but her H/H will likely drop slightly further.  Thus, we will transfuse 2 units of PRBCs today.

## 2017-11-29 NOTE — Progress Notes (Signed)
Physical Therapy Treatment Patient Details Name: Rachel Warren MRN: [id number] DOB: 21-Apr-1976 Today's Date: 11/29/2017    History of Present Illness Pt s/p R THR    PT Comments    Pt cooperative and progressing slowly with mobility.  Pt limited by increased discomfort this pm and fatigues easily but no c/o dizziness after one unit RBC transfusion.   Follow Up Recommendations  Follow surgeon's recommendation for DC plan and follow-up therapies     Equipment Recommendations  Rolling walker with 5" wheels;3in1 (PT)    Recommendations for Other Services OT consult     Precautions / Restrictions Precautions Precautions: Fall Restrictions Weight Bearing Restrictions: No Other Position/Activity Restrictions: WBAT    Mobility  Bed Mobility Overal bed mobility: Needs Assistance Bed Mobility: Supine to Sit     Supine to sit: Min assist     General bed mobility comments: cues for sequence and use of L LE to self assist  Transfers Overall transfer level: Needs assistance Equipment used: Rolling walker (2 wheeled) Transfers: Sit to/from Stand Sit to Stand: Min assist         General transfer comment: cues for LE management and use of UEs to self assist  Ambulation/Gait Ambulation/Gait assistance: Min assist;+2 safety/equipment Ambulation Distance (Feet): 29 Feet Assistive device: Rolling walker (2 wheeled) Gait Pattern/deviations: Step-to pattern;Decreased step length - right;Decreased step length - left;Shuffle Gait velocity: decr   General Gait Details: cues for sequence, posture and position from RW.  Distance ltd by onset dizziness   Stairs             Wheelchair Mobility    Modified Rankin (Stroke Patients Only)       Balance Overall balance assessment: Needs assistance Sitting-balance support: No upper extremity supported;Feet supported Sitting balance-Leahy Scale: Good     Standing balance support: Bilateral upper extremity  supported Standing balance-Leahy Scale: Poor                              Cognition Arousal/Alertness: Awake/alert Behavior During Therapy: WFL for tasks assessed/performed Overall Cognitive Status: Within Functional Limits for tasks assessed                                        Exercises      General Comments        Pertinent Vitals/Pain Pain Assessment: 0-10 Pain Score: 6  Pain Location: R hip Pain Descriptors / Indicators: Aching;Sore;Other (Comment)(pinch at incision site) Pain Intervention(s): Limited activity within patient's tolerance;Monitored during session;Premedicated before session;Ice applied;Patient requesting pain meds-RN notified    Home Living                      Prior Function            PT Goals (current goals can now be found in the care plan section) Acute Rehab PT Goals Patient Stated Goal: Regain IND PT Goal Formulation: With patient Time For Goal Achievement: 12/05/17 Potential to Achieve Goals: Good Progress towards PT goals: Progressing toward goals    Frequency    7X/week      PT Plan Current plan remains appropriate    Co-evaluation              AM-PAC PT "6 Clicks" Daily Activity  Outcome Measure  Difficulty turning over in bed (including adjusting bedclothes,  sheets and blankets)?: Unable Difficulty moving from lying on back to sitting on the side of the bed? : Unable Difficulty sitting down on and standing up from a chair with arms (e.g., wheelchair, bedside commode, etc,.)?: Unable Help needed moving to and from a bed to chair (including a wheelchair)?: A Little Help needed walking in hospital room?: A Little Help needed climbing 3-5 steps with a railing? : A Lot 6 Click Score: 11    End of Session Equipment Utilized During Treatment: Gait belt Activity Tolerance: Patient tolerated treatment well Patient left: in chair;with call bell/phone within reach;with family/visitor  present Nurse Communication: Mobility status PT Visit Diagnosis: Difficulty in walking, not elsewhere classified (R26.2)     Time: 9767-3419 PT Time Calculation (min) (ACUTE ONLY): 18 min  Charges:  $Gait Training: 8-22 mins                    G Codes:       Pg [phone]    Seanpatrick Maisano 11/29/2017, 3:09 PM

## 2017-11-29 NOTE — Evaluation (Signed)
Occupational Therapy Evaluation Patient Details Name: Rachel Warren MRN: [id number] DOB: Jan 22, 1976 Today's Date: 11/29/2017    History of Present Illness Pt s/p R THR   Clinical Impression   Pt up to [facility] with walker this visit and OT began education on AE options and tub DME. Pt will benefit from continued OT to progress ADL independence for d/c home.    Follow Up Recommendations  No OT follow up    Equipment Recommendations  3 in 1 bedside commode(delivered to room)    Recommendations for Other Services       Precautions / Restrictions Precautions Precautions: Fall Restrictions Weight Bearing Restrictions: No Other Position/Activity Restrictions: WBAT      Mobility Bed Mobility      General bed mobility comments: pt in chair.   Transfers Overall transfer level: Needs assistance Equipment used: Rolling walker (2 wheeled) Transfers: Sit to/from Stand Sit to Stand: Min assist         General transfer comment: cues for hand placement and LE management.    Balance Overall balance assessment: Needs assistance Sitting-balance support: No upper extremity supported;Feet supported Sitting balance-Leahy Scale: Good     Standing balance support: Bilateral upper extremity supported Standing balance-Leahy Scale: Poor                             ADL either performed or assessed with clinical judgement   ADL Overall ADL's : Needs assistance/impaired Eating/Feeding: Independent;Sitting   Grooming: Wash/dry hands;Set up;Sitting   Upper Body Bathing: Set up;Sitting   Lower Body Bathing: Moderate assistance;Sit to/from stand   Upper Body Dressing : Set up;Sitting   Lower Body Dressing: Moderate assistance;Sit to/from stand   Toilet Transfer: Minimal assistance;Stand-pivot;RW;BSC   Toileting- Clothing Manipulation and Hygiene: Minimal assistance;Sit to/from stand         General ADL Comments: Educated pt on AE for LB dressing and pt is  interested in obtaining AE kit. Also discussed tub transfer and DME options. Pt pivoted to [facility] today from chair.      Vision Patient Visual Report: No change from baseline       Perception     Praxis      Pertinent Vitals/Pain Pain Assessment: 0-10 Pain Score: 6  Pain Location: R hip and knee Pain Descriptors / Indicators: Aching Pain Intervention(s): Monitored during session     Hand Dominance     Extremity/Trunk Assessment Upper Extremity Assessment Upper Extremity Assessment: Overall WFL for tasks assessed           Communication Communication Communication: No difficulties   Cognition Arousal/Alertness: Awake/alert Behavior During Therapy: WFL for tasks assessed/performed Overall Cognitive Status: Within Functional Limits for tasks assessed                                     General Comments       Exercises     Shoulder Instructions      Home Living Family/patient expects to be discharged to:: Private residence Living Arrangements: Spouse/significant other Available Help at Discharge: Family Type of Home: House Home Access: Stairs to enter Technical brewer of Steps: 1   Home Layout: One level     Bathroom Shower/Tub: Teacher, early years/pre: Standard     Home Equipment: None          Prior Functioning/Environment Level of Independence: Independent  OT Problem List: Decreased knowledge of use of DME or AE;Decreased strength      OT Treatment/Interventions: Self-care/ADL training;DME and/or AE instruction;Therapeutic activities;Patient/family education    OT Goals(Current goals can be found in the care plan section) Acute Rehab OT Goals Patient Stated Goal: return to independence. OT Goal Formulation: With patient Time For Goal Achievement: 12/06/17 Potential to Achieve Goals: Good  OT Frequency: Min 2X/week   Barriers to D/C:            Co-evaluation               AM-PAC PT "6 Clicks" Daily Activity     Outcome Measure Help from another person eating meals?: None Help from another person taking care of personal grooming?: A Little Help from another person toileting, which includes using toliet, bedpan, or urinal?: A Little Help from another person bathing (including washing, rinsing, drying)?: A Little Help from another person to put on and taking off regular upper body clothing?: None Help from another person to put on and taking off regular lower body clothing?: A Lot 6 Click Score: 19   End of Session Equipment Utilized During Treatment: Rolling walker  Activity Tolerance: Patient tolerated treatment well Patient left: in chair;with call bell/phone within reach;with family/visitor present  OT Visit Diagnosis: Unsteadiness on feet (R26.81);Muscle weakness (generalized) (M62.81)                Time: 5391-2258 OT Time Calculation (min): 20 min Charges:  OT General Charges $OT Visit: 1 Visit OT Evaluation $OT Eval Low Complexity: 1 Low G-Codes:       Jae Dire Imaan Padgett 11/29/2017, 3:27 PM

## 2017-11-30 LAB — CBC
HEMATOCRIT: 28.3 % — AB (ref 36.0–46.0)
HEMOGLOBIN: 8.7 g/dL — AB (ref 12.0–15.0)
MCH: 22.3 pg — ABNORMAL LOW (ref 26.0–34.0)
MCHC: 30.7 g/dL (ref 30.0–36.0)
MCV: 72.4 fL — ABNORMAL LOW (ref 78.0–100.0)
Platelets: 220 10*3/uL (ref 150–400)
RBC: 3.91 MIL/uL (ref 3.87–5.11)
RDW: 20 % — ABNORMAL HIGH (ref 11.5–15.5)
WBC: 8.3 10*3/uL (ref 4.0–10.5)

## 2017-11-30 NOTE — Progress Notes (Signed)
Physical Therapy Treatment Patient Details Name: Rachel Warren MRN: [id number] DOB: 1976-01-15 Today's Date: 11/30/2017    History of Present Illness Pt s/p R THR-DA    PT Comments    Pt is slowly progressing with mobility, she ambulated 73' with RW today. Min assist for THA HEP.    Follow Up Recommendations  Follow surgeon's recommendation for DC plan and follow-up therapies     Equipment Recommendations  Rolling walker with 5" wheels;3in1 (PT)    Recommendations for Other Services OT consult     Precautions / Restrictions Precautions Precautions: Fall Restrictions Weight Bearing Restrictions: No Other Position/Activity Restrictions: WBAT    Mobility  Bed Mobility Overal bed mobility: Needs Assistance Bed Mobility: Supine to Sit     Supine to sit: Min assist     General bed mobility comments: assist to support RLE and raise trunk  Transfers Overall transfer level: Needs assistance Equipment used: Rolling walker (2 wheeled) Transfers: Sit to/from Stand Sit to Stand: Min guard         General transfer comment: cues for LE management and use of UEs to self assist  Ambulation/Gait Ambulation/Gait assistance: Min guard Ambulation Distance (Feet): 50 Feet Assistive device: Rolling walker (2 wheeled) Gait Pattern/deviations: Step-to pattern;Decreased step length - right;Decreased step length - left;Shuffle Gait velocity: decr   General Gait Details: cues for sequence, slow but steady   Chief Strategy Officer    Modified Rankin (Stroke Patients Only)       Balance Overall balance assessment: Needs assistance Sitting-balance support: No upper extremity supported;Feet supported Sitting balance-Leahy Scale: Good     Standing balance support: Bilateral upper extremity supported Standing balance-Leahy Scale: Poor                              Cognition Arousal/Alertness: Awake/alert Behavior During  Therapy: WFL for tasks assessed/performed Overall Cognitive Status: Within Functional Limits for tasks assessed                                        Exercises Total Joint Exercises Ankle Circles/Pumps: AROM;Both;15 reps;Supine Short Arc Quad: AROM;Right;10 reps;Supine Heel Slides: AAROM;Right;Supine;10 reps Hip ABduction/ADduction: AAROM;Right;Supine;10 reps    General Comments        Pertinent Vitals/Pain Pain Score: 6  Pain Location: R hip Pain Descriptors / Indicators: Aching Pain Intervention(s): Limited activity within patient's tolerance;Monitored during session;Premedicated before session;Ice applied    Home Living                      Prior Function            PT Goals (current goals can now be found in the care plan section) Acute Rehab PT Goals Patient Stated Goal: return to independence, play basketball, gardening PT Goal Formulation: With patient/family Time For Goal Achievement: 12/05/17 Potential to Achieve Goals: Good Progress towards PT goals: Progressing toward goals    Frequency    7X/week      PT Plan Current plan remains appropriate    Co-evaluation              AM-PAC PT "6 Clicks" Daily Activity  Outcome Measure  Difficulty turning over in bed (including adjusting bedclothes, sheets and blankets)?: Unable Difficulty moving from lying on back to  sitting on the side of the bed? : Unable Difficulty sitting down on and standing up from a chair with arms (e.g., wheelchair, bedside commode, etc,.)?: Unable Help needed moving to and from a bed to chair (including a wheelchair)?: A Little Help needed walking in hospital room?: A Little Help needed climbing 3-5 steps with a railing? : A Lot 6 Click Score: 11    End of Session Equipment Utilized During Treatment: Gait belt Activity Tolerance: Patient limited by fatigue;Patient limited by pain Patient left: in chair;with call bell/phone within reach;with  family/visitor present;with chair alarm set Nurse Communication: Mobility status PT Visit Diagnosis: Difficulty in walking, not elsewhere classified (R26.2)     Time: 2878-6767 PT Time Calculation (min) (ACUTE ONLY): 29 min  Charges:  $Gait Training: 8-22 mins $Therapeutic Exercise: 8-22 mins                    G Codes:          Philomena Doheny 11/30/2017, 9:02 AM [phone]

## 2017-11-30 NOTE — Care Management Note (Signed)
Case Management Note  Patient Details  Name: Melayah Skorupski MRN: [id number] Date of Birth: 02-11-1976  Subjective/Objective:   Right THA                 Action/Plan: NCM spoke to pt and offered choice for [facility]. Pt agreeable to [facility]. (preoperatively arranged by surgeon's office). Pt requested RW and 3n1. Contacted [facility] for DME to be delivered to room prior to dc.   Expected Discharge Date:                  Expected Discharge Plan:  [place]  In-House Referral:  NA  Discharge planning Services  CM Consult  Post Acute Care Choice:  Home Health Choice offered to:  Patient  DME Arranged:  Walker rolling, 3-N-1 DME Agency:  [place]:  PT [place] Agency:  [facility] (formerly [facility])  Status of Service:  Completed, signed off  If discussed at [employer] of Stay Meetings, dates discussed:    Additional Comments:  Erenest Rasher, RN 11/30/2017, 9:10 AM

## 2017-11-30 NOTE — Progress Notes (Signed)
Subjective: Patient stable.  Less dizzy today.  Is reporting some burning around the right leg incision.   Objective: Vital signs in last 24 hours: Temp:  [97.8 F (36.6 C)-99.3 F (37.4 C)] 98.8 F (37.1 C) (05/19 0539) Pulse Rate:  [89-136] 121 (05/19 0540) Resp:  [16-20] 16 (05/19 0539) BP: (100-144)/(71-90) 142/87 (05/19 0539) SpO2:  [99 %-100 %] 100 % (05/19 0540)  Intake/Output from previous day: 05/18 0701 - 05/19 0700 In: 1868.3 [P.O.:960; I.V.:250; Blood:658.3] Out: 350 [Urine:350] Intake/Output this shift: No intake/output data recorded.  Exam:  Dorsiflexion/Plantar flexion intact  Labs: Recent Labs    11/29/17 0517 11/30/17 0510  HGB 7.1* 8.7*   Recent Labs    11/29/17 0517 11/30/17 0510  WBC 7.3 8.3  RBC 3.41* 3.91  HCT 23.9* 28.3*  PLT 239 220   Recent Labs    11/29/17 0517  NA 138  K 3.5  CL 105  CO2 24  BUN 7  CREATININE 0.60  GLUCOSE 114*  CALCIUM 8.4*   No results for input(s): LABPT, INR in the last 72 hours.  Assessment/Plan: Patient doing well this morning.  Hemoglobin is improved.  Plan is to continue therapy for mobilization.  Anticipate discharge within the next 1 to 2 days   G Alphonzo Severance 11/30/2017, 8:18 AM

## 2017-11-30 NOTE — Progress Notes (Signed)
Physical Therapy Treatment Patient Details Name: Rachel Warren MRN: [id number] DOB: [birth year] Today's Date: 11/30/2017    History of Present Illness Pt s/p R THR-DA    PT Comments    Pt tolerated increased distance with ambulation this session, she walked 24' with RW. Min A for THA HEP. Will do stair training tomorrow morning, then expect she will be ready to DC home from PT standpoint.   Follow Up Recommendations  Follow surgeon's recommendation for DC plan and follow-up therapies     Equipment Recommendations  Rolling walker with 5" wheels;3in1 (PT)    Recommendations for Other Services OT consult     Precautions / Restrictions Precautions Precautions: Fall Restrictions Weight Bearing Restrictions: No Other Position/Activity Restrictions: WBAT    Mobility  Bed Mobility Overal bed mobility: Needs Assistance Bed Mobility: Supine to Sit     Supine to sit: Min guard     General bed mobility comments: VCs to self assist RLE with LLE  Transfers Overall transfer level: Needs assistance Equipment used: Rolling walker (2 wheeled) Transfers: Sit to/from Stand Sit to Stand: Min guard         General transfer comment: cues for LE management and use of UEs to self assist  Ambulation/Gait Ambulation/Gait assistance: Min guard Ambulation Distance (Feet): 80 Feet Assistive device: Rolling walker (2 wheeled) Gait Pattern/deviations: Step-to pattern;Decreased step length - right;Decreased step length - left Gait velocity: decr   General Gait Details: cues for sequence, slow but steady   Marine scientist Rankin (Stroke Patients Only)       Balance Overall balance assessment: Needs assistance Sitting-balance support: No upper extremity supported;Feet supported Sitting balance-Leahy Scale: Good     Standing balance support: Bilateral upper extremity supported Standing balance-Leahy Scale: Poor                               Cognition Arousal/Alertness: Awake/alert Behavior During Therapy: WFL for tasks assessed/performed Overall Cognitive Status: Within Functional Limits for tasks assessed                                        Exercises Total Joint Exercises Ankle Circles/Pumps: AROM;Both;15 reps;Supine Heel Slides: AAROM;Right;Supine;15 reps Hip ABduction/ADduction: AAROM;Right;Supine;15 reps Long Arc Quad: AROM;Right;10 reps;Seated    General Comments        Pertinent Vitals/Pain Pain Score: 6  Pain Location: R hip Pain Descriptors / Indicators: Aching Pain Intervention(s): Limited activity within patient's tolerance;Monitored during session;Premedicated before session;Ice applied    Home Living                      Prior Function            PT Goals (current goals can now be found in the care plan section) Acute Rehab PT Goals Patient Stated Goal: return to independence, play basketball, gardening PT Goal Formulation: With patient/family Time For Goal Achievement: 12/05/17 Potential to Achieve Goals: Good Progress towards PT goals: Progressing toward goals    Frequency    7X/week      PT Plan Current plan remains appropriate    Co-evaluation              AM-PAC PT "6 Clicks" Daily Activity  Outcome Measure  Difficulty  turning over in bed (including adjusting bedclothes, sheets and blankets)?: A Little Difficulty moving from lying on back to sitting on the side of the bed? : A Lot Difficulty sitting down on and standing up from a chair with arms (e.g., wheelchair, bedside commode, etc,.)?: A Little Help needed moving to and from a bed to chair (including a wheelchair)?: A Little Help needed walking in hospital room?: A Little Help needed climbing 3-5 steps with a railing? : A Lot 6 Click Score: 16    End of Session Equipment Utilized During Treatment: Gait belt Activity Tolerance: Patient tolerated treatment  well Patient left: with call bell/phone within reach;with family/visitor present;in bed Nurse Communication: Mobility status PT Visit Diagnosis: Difficulty in walking, not elsewhere classified (R26.2)     Time: 1321-1350 PT Time Calculation (min) (ACUTE ONLY): 29 min  Charges:  $Gait Training: 8-22 mins $Therapeutic Exercise: 8-22 mins                    G Codes:          Philomena Doheny 11/30/2017, 1:57 PM [phone]

## 2017-12-01 LAB — BPAM RBC
Blood Product Expiration Date: 201906162359
Blood Product Expiration Date: 201906162359
ISSUE DATE / TIME: 201905181008
ISSUE DATE / TIME: 201905181600
Unit Type and Rh: 7300
Unit Type and Rh: 7300

## 2017-12-01 LAB — CBC
HEMATOCRIT: 26.7 % — AB (ref 36.0–46.0)
Hemoglobin: 8.2 g/dL — ABNORMAL LOW (ref 12.0–15.0)
MCH: 22.3 pg — AB (ref 26.0–34.0)
MCHC: 30.7 g/dL (ref 30.0–36.0)
MCV: 72.8 fL — AB (ref 78.0–100.0)
Platelets: 218 10*3/uL (ref 150–400)
RBC: 3.67 MIL/uL — ABNORMAL LOW (ref 3.87–5.11)
RDW: 20.3 % — AB (ref 11.5–15.5)
WBC: 6.9 10*3/uL (ref 4.0–10.5)

## 2017-12-01 LAB — TYPE AND SCREEN
ABO/RH(D): B POS
Antibody Screen: NEGATIVE
UNIT DIVISION: 0
Unit division: 0

## 2017-12-01 NOTE — Progress Notes (Signed)
Physical Therapy Treatment Patient Details Name: Rachel Warren MRN: [id number] DOB: 06/21/1976 Today's Date: 12/01/2017    History of Present Illness Pt s/p R THR-DA    PT Comments    Ready for DC.   Follow Up Recommendations  Follow surgeon's recommendation for DC plan and follow-up therapies     Equipment Recommendations  Rolling walker with 5" wheels;3in1 (PT)    Recommendations for Other Services       Precautions / Restrictions Precautions Precautions: Fall Restrictions Weight Bearing Restrictions: No Other Position/Activity Restrictions: WBAT    Mobility  Bed Mobility   Bed Mobility: Supine to Sit     Supine to sit: Min assist     General bed mobility comments: in recliner  Transfers Overall transfer level: Needs assistance Equipment used: Rolling walker (2 wheeled) Transfers: Sit to/from Stand Sit to Stand: Min guard Stand pivot transfers: Supervision       General transfer comment: cues for LE management and use of UEs to self assist  Ambulation/Gait Ambulation/Gait assistance: Min guard Ambulation Distance (Feet): 80 Feet Assistive device: Rolling walker (2 wheeled) Gait Pattern/deviations: Step-to pattern;Step-through pattern     General Gait Details: cues for sequence, slow but steady   Stairs Stairs: Yes Stairs assistance: Min guard Stair Management: No rails;Forwards;With walker Number of Stairs: 1     Wheelchair Mobility    Modified Rankin (Stroke Patients Only)       Balance Overall balance assessment: Needs assistance Sitting-balance support: No upper extremity supported;Feet supported Sitting balance-Leahy Scale: Good     Standing balance support: Bilateral upper extremity supported Standing balance-Leahy Scale: Poor                              Cognition Arousal/Alertness: Awake/alert Behavior During Therapy: WFL for tasks assessed/performed Overall Cognitive Status: Within Functional  Limits for tasks assessed                                        Exercises     General Comments        Pertinent Vitals/Pain Pain Score: 2  Pain Location: R hip Pain Descriptors / Indicators: Sore Pain Intervention(s): Monitored during session    Home Living                      Prior Function            PT Goals (current goals can now be found in the care plan section) Progress towards PT goals: Progressing toward goals    Frequency    7X/week      PT Plan Current plan remains appropriate    Co-evaluation              AM-PAC PT "6 Clicks" Daily Activity  Outcome Measure  Difficulty turning over in bed (including adjusting bedclothes, sheets and blankets)?: A Little Difficulty moving from lying on back to sitting on the side of the bed? : A Little Difficulty sitting down on and standing up from a chair with arms (e.g., wheelchair, bedside commode, etc,.)?: A Little Help needed moving to and from a bed to chair (including a wheelchair)?: A Little Help needed walking in hospital room?: A Little Help needed climbing 3-5 steps with a railing? : A Little 6 Click Score: 18    End of Session  Activity Tolerance: Patient tolerated treatment well Patient left: in chair;with call bell/phone within reach Nurse Communication: Mobility status PT Visit Diagnosis: Difficulty in walking, not elsewhere classified (R26.2)     Time: 7195-9747 PT Time Calculation (min) (ACUTE ONLY): 17 min  Charges:  $Gait Training: 8-22 mins                    G Codes:        Claretha Cooper 12/01/2017, 1:23 PM

## 2017-12-01 NOTE — Progress Notes (Signed)
Reviewed discharge information with patient and family. Both verbalized understanding of all instructions. Will d/c to home with instructions, belongings, instructions, prescriptions. To pov via w/c.

## 2017-12-01 NOTE — Progress Notes (Signed)
Physical Therapy Treatment Patient Details Name: Rachel Warren MRN: [id number] DOB: 1975/12/03 Today's Date: 12/01/2017    History of Present Illness Pt s/p R THR-DA    PT Comments    Patient is progressing well. Will practice step next visit, plans DC home.   Follow Up Recommendations  Follow surgeon's recommendation for DC plan and follow-up therapies     Equipment Recommendations  Rolling walker with 5" wheels;3in1 (PT)    Recommendations for Other Services       Precautions / Restrictions Precautions Precautions: Fall Restrictions Weight Bearing Restrictions: No Other Position/Activity Restrictions: WBAT    Mobility  Bed Mobility   Bed Mobility: Supine to Sit     Supine to sit: Min assist     General bed mobility comments: extra time, used belt and left foot to self assist  Transfers Overall transfer level: Needs assistance Equipment used: Rolling walker (2 wheeled) Transfers: Sit to/from Stand Sit to Stand: Min guard Stand pivot transfers: Supervision       General transfer comment: cues for LE management and use of UEs to self assist  Ambulation/Gait Ambulation/Gait assistance: Min guard Ambulation Distance (Feet): 20 Feet Assistive device: Rolling walker (2 wheeled) Gait Pattern/deviations: Step-to pattern;Decreased step length - right;Decreased step length - left     General Gait Details: cues for sequence, slow but steady   Marine scientist Rankin (Stroke Patients Only)       Balance Overall balance assessment: Needs assistance Sitting-balance support: No upper extremity supported;Feet supported Sitting balance-Leahy Scale: Good     Standing balance support: Bilateral upper extremity supported Standing balance-Leahy Scale: Poor                              Cognition Arousal/Alertness: Awake/alert Behavior During Therapy: WFL for tasks assessed/performed Overall  Cognitive Status: Within Functional Limits for tasks assessed                                        Exercises Total Joint Exercises Ankle Circles/Pumps: AROM;Both;15 reps;Supine Quad Sets: AROM;Both;10 reps;Supine Short Arc Quad: AROM;Right;10 reps;Supine Heel Slides: AAROM;Right;Supine;15 reps Hip ABduction/ADduction: AAROM;Right;Supine;15 reps Long Arc Quad: AROM;Right;10 reps;Seated    General Comments        Pertinent Vitals/Pain Pain Score: 4  Pain Location: R hip Pain Descriptors / Indicators: Sore Pain Intervention(s): Monitored during session;Premedicated before session    Home Living                      Prior Function            PT Goals (current goals can now be found in the care plan section) Progress towards PT goals: Progressing toward goals    Frequency    7X/week      PT Plan Current plan remains appropriate    Co-evaluation              AM-PAC PT "6 Clicks" Daily Activity  Outcome Measure  Difficulty turning over in bed (including adjusting bedclothes, sheets and blankets)?: A Little Difficulty moving from lying on back to sitting on the side of the bed? : A Little Difficulty sitting down on and standing up from a chair with arms (e.g., wheelchair, bedside commode, etc,.)?: A  Little Help needed moving to and from a bed to chair (including a wheelchair)?: A Little Help needed walking in hospital room?: A Little Help needed climbing 3-5 steps with a railing? : A Lot 6 Click Score: 17    End of Session   Activity Tolerance: Patient tolerated treatment well Patient left: (in BR  with OT) Nurse Communication: Mobility status PT Visit Diagnosis: Difficulty in walking, not elsewhere classified (R26.2)     Time: 5643-3295 PT Time Calculation (min) (ACUTE ONLY): 20 min  Charges:  $Gait Training: 8-22 mins                    G Codes:         Claretha Cooper 12/01/2017, 1:17 PM

## 2017-12-01 NOTE — Progress Notes (Signed)
Occupational Therapy Treatment Patient Details Name: Ellisha Bankson MRN: [id number] DOB: [birth year] Today's Date: 12/01/2017    History of present illness Pt s/p R THR-DA   OT comments  3 n 1 delivered. Pt will obtain AE  Follow Up Recommendations  No OT follow up    Equipment Recommendations  3 in 1 bedside commode(delivered to room)    Recommendations for Other Services      Precautions / Restrictions Precautions Precautions: Fall Restrictions Weight Bearing Restrictions: No Other Position/Activity Restrictions: WBAT       Mobility Bed Mobility               General bed mobility comments: Pt OOB  Transfers Overall transfer level: Needs assistance Equipment used: Rolling walker (2 wheeled) Transfers: Sit to/from [employer] Transfers Sit to Stand: Supervision Stand pivot transfers: Supervision            Balance Overall balance assessment: Needs assistance Sitting-balance support: No upper extremity supported;Feet supported Sitting balance-Leahy Scale: Good     Standing balance support: Bilateral upper extremity supported Standing balance-Leahy Scale: Poor                             ADL either performed or assessed with clinical judgement   ADL Overall ADL's : Needs assistance/impaired     Grooming: Wash/dry hands;Set up;Sitting   Upper Body Bathing: Set up;Sitting   Lower Body Bathing: Minimal assistance;Sit to/from stand;Cueing for sequencing;Cueing for safety   Upper Body Dressing : Set up;Sitting   Lower Body Dressing: Minimal assistance;Sit to/from stand;Cueing for sequencing;Cueing for safety   Toilet Transfer: Supervision/safety;Ambulation;Stand-pivot   Toileting- Clothing Manipulation and Hygiene: Supervision/safety;Sit to/from stand;Cueing for sequencing;Cueing for safety     Tub/Shower Transfer Details (indicate cue type and reason): verbalized safety         Vision Patient Visual Report: No  change from baseline     Perception     Praxis      Cognition Arousal/Alertness: Awake/alert Behavior During Therapy: WFL for tasks assessed/performed Overall Cognitive Status: Within Functional Limits for tasks assessed                                          Exercises     Shoulder Instructions       General Comments      Pertinent Vitals/ Pain       Pain Score: 4  Pain Location: R hip Pain Descriptors / Indicators: Sore Pain Intervention(s): Limited activity within patient's tolerance;Monitored during session  Home Living                                          Prior Functioning/Environment              Frequency  Min 2X/week        Progress Toward Goals  OT Goals(current goals can now be found in the care plan section)  Progress towards OT goals: Progressing toward goals     Plan      Co-evaluation                 AM-PAC PT "6 Clicks" Daily Activity     Outcome Measure   Help from another person eating meals?: None Help  from another person taking care of personal grooming?: A Little Help from another person toileting, which includes using toliet, bedpan, or urinal?: A Little Help from another person bathing (including washing, rinsing, drying)?: A Little Help from another person to put on and taking off regular upper body clothing?: None Help from another person to put on and taking off regular lower body clothing?: A Little 6 Click Score: 20    End of Session Equipment Utilized During Treatment: Rolling walker  OT Visit Diagnosis: Unsteadiness on feet (R26.81);Muscle weakness (generalized) (M62.81)   Activity Tolerance Patient tolerated treatment well   Patient Left in chair;with call bell/phone within reach;with family/visitor present   Nurse Communication Mobility status        Time: 5397-6734 OT Time Calculation (min): 20 min  Charges: OT General Charges $OT Visit: 1 Visit OT  Treatments $Self Care/Home Management : 8-22 mins  [place], [place]   Payton Mccallum D 12/01/2017, 10:39 AM

## 2017-12-01 NOTE — Progress Notes (Signed)
Patient ID: Rachel Warren, female   DOB: 01-09-1976, 42 y.o.   MRN: [id number] Doing well overall.  Can be discharged to home today.

## 2017-12-01 NOTE — Discharge Summary (Signed)
Patient ID: Rachel Warren MRN: [id number] DOB/AGE: [birth year] 43 y.o.  Admit date: 11/28/2017 Discharge date: 12/01/2017  Admission Diagnoses:  Principal Problem:   Unilateral primary osteoarthritis, right hip Active Problems:   Status post total replacement of right hip   Discharge Diagnoses:  Same  Past Medical History:  Diagnosis Date  . Anemia   . Arthritis   . Back pain    lower back   . Complication of anesthesia    age 35 had twilight anesthesia and woke up suring surgery   . Depression   . Hypertension   . Pinched nerve    in back    Surgeries: Procedure(s): RIGHT TOTAL HIP ARTHROPLASTY ANTERIOR APPROACH on 11/28/2017   Consultants:   Discharged Condition: Improved  Hospital Course: Rachel Warren is an 42 y.o. female who was admitted 11/28/2017 for operative treatment ofUnilateral primary osteoarthritis, right hip. Patient has severe unremitting pain that affects sleep, daily activities, and work/hobbies. After pre-op clearance the patient was taken to the operating room on 11/28/2017 and underwent  Procedure(s): RIGHT TOTAL HIP ARTHROPLASTY ANTERIOR APPROACH.    Patient was given perioperative antibiotics:  Anti-infectives (From admission, onward)   Start     Dose/Rate Route Frequency Ordered Stop   11/28/17 1500  ceFAZolin (ANCEF) IVPB 1 g/50 mL premix     1 g 100 mL/hr over 30 Minutes Intravenous Every 6 hours 11/28/17 1157 11/28/17 2039   11/28/17 0645  ceFAZolin (ANCEF) IVPB 2g/100 mL premix     2 g 200 mL/hr over 30 Minutes Intravenous On call to O.R. 11/28/17 4098 11/28/17 1191       Patient was given sequential compression devices, early ambulation, and chemoprophylaxis to prevent DVT.  Patient benefited maximally from hospital stay and there were no complications.    Recent vital signs:  Patient Vitals for the past 24 hrs:  BP Temp Temp src Pulse Resp SpO2  12/01/17 0550 117/68 98.6 F (37 C) Oral 79 18 98 %  11/30/17 2013  125/75 98 F (36.7 C) Oral 98 19 99 %  11/30/17 1406 120/79 - - (!) 129 16 99 %     Recent laboratory studies:  Recent Labs    11/29/17 0517 11/30/17 0510 12/01/17 0536  WBC 7.3 8.3 6.9  HGB 7.1* 8.7* 8.2*  HCT 23.9* 28.3* 26.7*  PLT 239 220 218  NA 138  --   --   K 3.5  --   --   CL 105  --   --   CO2 24  --   --   BUN 7  --   --   CREATININE 0.60  --   --   GLUCOSE 114*  --   --   CALCIUM 8.4*  --   --      Discharge Medications:   Allergies as of 12/01/2017   No Known Allergies     Medication List    STOP taking these medications   celecoxib 200 MG capsule Commonly known as:  CELEBREX   cyclobenzaprine 10 MG tablet Commonly known as:  FLEXERIL   HYDROcodone-acetaminophen 10-325 MG tablet Commonly known as:  NORCO   ibuprofen 800 MG tablet Commonly known as:  ADVIL,MOTRIN   meloxicam 15 MG tablet Commonly known as:  MOBIC   traMADol 50 MG tablet Commonly known as:  ULTRAM     TAKE these medications   amLODipine-benazepril 10-20 MG capsule Commonly known as:  LOTREL TAKE 1 CAPSULE BY MOUTH DAILY   aspirin  81 MG chewable tablet Chew 1 tablet (81 mg total) by mouth 2 (two) times daily.   DULoxetine 30 MG capsule Commonly known as:  CYMBALTA Take 1 capsule (30 mg total) by mouth 2 (two) times daily.   ferrous sulfate 325 (65 FE) MG tablet Take 1 tablet (325 mg total) by mouth daily with breakfast.   methocarbamol 500 MG tablet Commonly known as:  ROBAXIN Take 1 tablet (500 mg total) by mouth every 6 (six) hours as needed for muscle spasms.   oxyCODONE 5 MG immediate release tablet Commonly known as:  Oxy IR/ROXICODONE Take 1-2 tablets (5-10 mg total) by mouth every 4 (four) hours as needed for moderate pain (pain score 4-6).            Durable Medical Equipment  (From admission, onward)        Start     Ordered   11/28/17 1157  DME Walker rolling  Once    Question:  Patient needs a walker to treat with the following condition   Answer:  Status post total replacement of right hip   11/28/17 1157   11/28/17 1157  DME 3 n 1  Once     11/28/17 1157      Diagnostic Studies: Dg Pelvis Portable  Result Date: 11/28/2017 CLINICAL DATA:  Status post right hip replacement EXAM: PORTABLE PELVIS 1-2 VIEWS COMPARISON:  11/28/2017 FINDINGS: Right hip replacement is noted. No acute bony abnormality is seen. Significant degenerative changes of the left hip joint are noted as well. IMPRESSION: Right hip replacement Electronically Signed   By: Inez Catalina M.D.   On: 11/28/2017 11:14   Dg C-arm 1-60 Min-no Report  Result Date: 11/28/2017 Fluoroscopy was utilized by the requesting physician.  No radiographic interpretation.   Dg Hip Operative Unilat W Or W/o Pelvis Right  Result Date: 11/28/2017 CLINICAL DATA:  Intra op right side anterior approach total hip replacement. Hx of OA. Imaging done in [facility] OR Rm 10. EXAM: OPERATIVE RIGHT HIP (WITH PELVIS IF PERFORMED) 2 VIEWS TECHNIQUE: Fluoroscopic spot image(s) were submitted for interpretation post-operatively. COMPARISON:  None. FINDINGS: New total right hip arthroplasty appears well seated and well aligned. No acute fracture or evidence of an operative complication. IMPRESSION: Well-positioned total right hip arthroplasty. Electronically Signed   By: Lajean Manes M.D.   On: 11/28/2017 11:04    Disposition: Discharge disposition: 01-Home or Self Care       Discharge Instructions    Discharge patient   Complete by:  As directed    Discharge disposition:  01-Home or Self Care   Discharge patient date:  12/01/2017      Follow-up Information    Mcarthur Rossetti, MD Follow up in 2 week(s).   Specialty:  Orthopedic Surgery Contact information: [place] [address] [phone]        [facility].   Specialty:  [place] Why:  [place] will call to arrange initial visit Contact  information: [place] [address] [phone]            Signed: Mcarthur Rossetti 12/01/2017, 7:10 AM

## 2017-12-11 ENCOUNTER — Ambulatory Visit (INDEPENDENT_AMBULATORY_CARE_PROVIDER_SITE_OTHER): Payer: BC Managed Care – PPO | Admitting: Orthopaedic Surgery

## 2017-12-11 ENCOUNTER — Encounter (INDEPENDENT_AMBULATORY_CARE_PROVIDER_SITE_OTHER): Payer: Self-pay | Admitting: Orthopaedic Surgery

## 2017-12-11 DIAGNOSIS — Z96641 Presence of right artificial hip joint: Secondary | ICD-10-CM

## 2017-12-11 NOTE — Progress Notes (Signed)
The patient is 2 weeks tomorrow status post a right total hip arthroplasty.  She is doing well.  She is not taking any pain medication at this point.  She is ambulate with a walker.  She has been in home therapy and they want to initiate after next week but do extended outpatient physical therapy.  [facility] has been to [facility] before but together again.  On examination her leg lengths are equal.  She does feel equal as well.  She was significantly short preop.  Her right hip incision looks good.  I removed staples and placed Steri-Strips.  She does have a moderate seroma and I drained about 60 cc of fluid off the hip area.  There is no evidence of infection.  At this point should continue increase her activities as she tolerates.  I did give her a prescription for outpatient physical therapy.  She can stop her twice daily aspirin as well.  She will resume all her previous arthritic medications as well.  We will see her back in a month to see how she is doing overall or sooner if there is any issues.  No x-rays are needed at that appointment.

## 2018-01-04 ENCOUNTER — Other Ambulatory Visit: Payer: Self-pay | Admitting: Family Medicine

## 2018-01-12 ENCOUNTER — Ambulatory Visit (INDEPENDENT_AMBULATORY_CARE_PROVIDER_SITE_OTHER): Payer: BC Managed Care – PPO | Admitting: Orthopaedic Surgery

## 2018-01-12 ENCOUNTER — Encounter (INDEPENDENT_AMBULATORY_CARE_PROVIDER_SITE_OTHER): Payer: Self-pay | Admitting: Orthopaedic Surgery

## 2018-01-12 DIAGNOSIS — Z96641 Presence of right artificial hip joint: Secondary | ICD-10-CM

## 2018-01-12 DIAGNOSIS — M1612 Unilateral primary osteoarthritis, left hip: Secondary | ICD-10-CM

## 2018-01-12 MED ORDER — HYDROCODONE-ACETAMINOPHEN 5-325 MG PO TABS: 1.0000 | ORAL_TABLET | Freq: Four times a day (QID) | ORAL | 0 refills | Status: DC | PRN

## 2018-01-12 NOTE — Progress Notes (Signed)
Patient is here today in follow-up 6 weeks after a right total hip arthroplasty.  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  She said the left hip is bothering quite a bit to the right hip is doing wonderful for her.  She wants to wait until next year to consider a left total hip arthroplasty.  On examination her right hip moves fluidly without any pain at all.  Her right hip has pain with internal and external rotation.  Her right hip incision is healed nicely.  Her leg lengths are equal.  We did give her previous postoperative films from the day of surgery to show her her right hip replacement.  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  At this point we will only set her up for an intra-articular steroid injection under direct fluoroscopy with Dr. Ernestina Patches in the left hip to see if this will help temporize her symptoms.  From my standpoint I do not need to see her back for 6 months.  At that visit I would like a low AP pelvis.  Obviously if her pain gets to be more severe and he gets to the point where she would like to go ahead and proceed with a left total hip arthroplasty, she will let us know.

## 2018-01-14 ENCOUNTER — Ambulatory Visit (INDEPENDENT_AMBULATORY_CARE_PROVIDER_SITE_OTHER): Payer: BC Managed Care – PPO | Admitting: Family Medicine

## 2018-01-14 ENCOUNTER — Other Ambulatory Visit (INDEPENDENT_AMBULATORY_CARE_PROVIDER_SITE_OTHER): Payer: Self-pay

## 2018-01-14 ENCOUNTER — Encounter: Payer: Self-pay | Admitting: Family Medicine

## 2018-01-14 VITALS — BP 122/80 | HR 88 | Temp 98.4°F | Ht 68.0 in | Wt 253.0 lb

## 2018-01-14 DIAGNOSIS — Z23 Encounter for immunization: Secondary | ICD-10-CM

## 2018-01-14 DIAGNOSIS — Z0000 Encounter for general adult medical examination without abnormal findings: Secondary | ICD-10-CM

## 2018-01-14 DIAGNOSIS — D509 Iron deficiency anemia, unspecified: Secondary | ICD-10-CM | POA: Diagnosis not present

## 2018-01-14 DIAGNOSIS — Z131 Encounter for screening for diabetes mellitus: Secondary | ICD-10-CM | POA: Diagnosis not present

## 2018-01-14 DIAGNOSIS — E049 Nontoxic goiter, unspecified: Secondary | ICD-10-CM

## 2018-01-14 DIAGNOSIS — Z13220 Encounter for screening for lipoid disorders: Secondary | ICD-10-CM | POA: Diagnosis not present

## 2018-01-14 DIAGNOSIS — M25552 Pain in left hip: Secondary | ICD-10-CM

## 2018-01-14 LAB — CBC WITH DIFFERENTIAL/PLATELET
BASOS PCT: 0.3 % (ref 0.0–3.0)
Basophils Absolute: 0 10*3/uL (ref 0.0–0.1)
EOS ABS: 0 10*3/uL (ref 0.0–0.7)
Eosinophils Relative: 0 % (ref 0.0–5.0)
HCT: 37.9 % (ref 36.0–46.0)
Hemoglobin: 12 g/dL (ref 12.0–15.0)
LYMPHS ABS: 1.4 10*3/uL (ref 0.7–4.0)
Lymphocytes Relative: 26.3 % (ref 12.0–46.0)
MCHC: 31.8 g/dL (ref 30.0–36.0)
MCV: 75.9 fl — ABNORMAL LOW (ref 78.0–100.0)
MONO ABS: 0.4 10*3/uL (ref 0.1–1.0)
Monocytes Relative: 6.8 % (ref 3.0–12.0)
NEUTROS PCT: 66.6 % (ref 43.0–77.0)
Neutro Abs: 3.5 10*3/uL (ref 1.4–7.7)
Platelets: 275 10*3/uL (ref 150.0–400.0)
RBC: 5 Mil/uL (ref 3.87–5.11)
RDW: 21.2 % — AB (ref 11.5–15.5)
WBC: 5.3 10*3/uL (ref 4.0–10.5)

## 2018-01-14 LAB — BASIC METABOLIC PANEL
BUN: 5 mg/dL — ABNORMAL LOW (ref 6–23)
CALCIUM: 9.1 mg/dL (ref 8.4–10.5)
CO2: 30 mEq/L (ref 19–32)
Chloride: 104 mEq/L (ref 96–112)
Creatinine, Ser: 0.69 mg/dL (ref 0.40–1.20)
GFR: 119.25 mL/min (ref >60.00)
GLUCOSE: 92 mg/dL (ref 70–99)
POTASSIUM: 3.8 meq/L (ref 3.5–5.1)
SODIUM: 142 meq/L (ref 135–145)

## 2018-01-14 LAB — LIPID PANEL
CHOLESTEROL: 175 mg/dL (ref 0–200)
HDL: 47.5 mg/dL (ref >39.00)
LDL Cholesterol: 95 mg/dL (ref 0–99)
NonHDL: 127.68
TRIGLYCERIDES: 164 mg/dL — AB (ref 0.0–149.0)
Total CHOL/HDL Ratio: 4
VLDL: 32.8 mg/dL (ref 0.0–40.0)

## 2018-01-14 LAB — TSH: TSH: 1.04 u[IU]/mL (ref 0.35–4.50)

## 2018-01-14 LAB — HEMOGLOBIN A1C: Hgb A1c MFr Bld: 4.8 % (ref 4.6–6.5)

## 2018-01-14 LAB — T4, FREE: FREE T4: 0.79 ng/dL (ref 0.60–1.60)

## 2018-01-14 NOTE — Progress Notes (Signed)
Subjective:     Rachel Warren is a 42 y.o. female and is here for a comprehensive physical exam. The patient reports no problems.  Pt recovering status post right hip replacement.  Pt states she still needs to have the left hip done but is thinking about waiting 1 year.  Pt also endorses increased stress as her and her husband are getting a divorce.  Pt states she is doing okay.  Notes her daughter has been checking on her more to make sure she is okay.  Pt endorses menses during Father's Day weekend, but restarted this week.  Pt thinks her cycle is changing 2/2 being around her daughter more who is currently on her cycle.  Social History   Socioeconomic History  . Marital status: Married    Spouse name: Not on file  . Number of children: Not on file  . Years of education: Not on file  . Highest education level: Not on file  Occupational History  . Not on file  Social Needs  . Financial resource strain: Not on file  . Food insecurity:    Worry: Not on file    Inability: Not on file  . Transportation needs:    Medical: Not on file    Non-medical: Not on file  Tobacco Use  . Smoking status: Former Smoker    Types: Cigarettes    Last attempt to quit: 06/14/2017    Years since quitting: 0.5  . Smokeless tobacco: Never Used  Substance and Sexual Activity  . Alcohol use: Yes    Comment: occasionally  . Drug use: Yes    Types: Marijuana  . Sexual activity: Not on file  Lifestyle  . Physical activity:    Days per week: Not on file    Minutes per session: Not on file  . Stress: Not on file  Relationships  . Social connections:    Talks on phone: Not on file    Gets together: Not on file    Attends religious service: Not on file    Active member of club or organization: Not on file    Attends meetings of clubs or organizations: Not on file    Relationship status: Not on file  . Intimate partner violence:    Fear of current or ex partner: Not on file    Emotionally  abused: Not on file    Physically abused: Not on file    Forced sexual activity: Not on file  Other Topics Concern  . Not on file  Social History Narrative  . Not on file   Health Maintenance  Topic Date Due  . HIV Screening  09/24/1989  . TETANUS/TDAP  07/16/2015  . PAP SMEAR  02/09/2016  . INFLUENZA VACCINE  03/20/2018 (Originally 02/12/2018)    The following portions of the patient's history were reviewed and updated as appropriate: allergies, current medications, past family history, past medical history, past social history, past surgical history and problem list.  Review of Systems A comprehensive review of systems was negative.   Objective:    BP 122/80 (BP Location: Left Arm, Patient Position: Sitting, Cuff Size: Large)   Pulse 88   Temp 98.4 F (36.9 C) (Oral)   Ht 5\' 8"  (1.727 m)   Wt 253 lb (114.8 kg)   LMP 01/14/2018 (Exact Date)   SpO2 98%   BMI 38.47 kg/m  General appearance: alert, cooperative and no distress Head: Normocephalic, without obvious abnormality, atraumatic Eyes: conjunctivae/corneas clear. PERRL, EOM's intact.  Fundi benign. Ears: normal TM's and external ear canals both ears Nose: Nares normal. Septum midline. Mucosa normal. No drainage or sinus tenderness. Throat: lips, mucosa, and tongue normal; teeth and gums normal Neck: no adenopathy, no carotid bruit, no JVD, supple, symmetrical, trachea midline and mild L lobe thyroid enlarged, symmetric, no tenderness/mass/nodules Lungs: clear to auscultation bilaterally Heart: regular rate and rhythm, S1, S2 normal, no murmur, click, rub or gallop Abdomen: soft, non-tender; bowel sounds normal; no masses,  no organomegaly Extremities: extremities normal, atraumatic, no cyanosis or edema Skin: Skin color, texture, turgor normal. No rashes or lesions Neurologic: Alert and oriented X 3, normal strength and tone. Normal symmetric reflexes. Normal coordination and gait    Assessment:    Healthy female  exam.     Plan:     Anticipatory guidance given including wearing seatbelts, smoke detectors in the home, increasing physical activity, increasing p.o. intake of water and vegetables. -We will obtain labs -Next CPE in 1 year -Patient to schedule mammogram, given handout See After Visit Summary for Counseling Recommendations   Goiter -s/p partial thyroidectomy (R lobe and isthmus removed) -TSH, T4  Anemia -Continue ferrous sulfate 325 mg daily -We will recheck CBC Follow-up PRN  Grier Mitts, MD

## 2018-01-14 NOTE — Patient Instructions (Addendum)
Preventive Care 40-64 Years, Female Preventive care refers to lifestyle choices and visits with your health care provider that can promote health and wellness. What does preventive care include?  A yearly physical exam. This is also called an annual well check.  Dental exams once or twice a year.  Routine eye exams. Ask your health care provider how often you should have your eyes checked.  Personal lifestyle choices, including: ? Daily care of your teeth and gums. ? Regular physical activity. ? Eating a healthy diet. ? Avoiding tobacco and drug use. ? Limiting alcohol use. ? Practicing safe sex. ? Taking low-dose aspirin daily starting at age 58. ? Taking vitamin and mineral supplements as recommended by your health care provider. What happens during an annual well check? The services and screenings done by your health care provider during your annual well check will depend on your age, overall health, lifestyle risk factors, and family history of disease. Counseling Your health care provider may ask you questions about your:  Alcohol use.  Tobacco use.  Drug use.  Emotional well-being.  Home and relationship well-being.  Sexual activity.  Eating habits.  Work and work Statistician.  Method of birth control.  Menstrual cycle.  Pregnancy history.  Screening You may have the following tests or measurements:  Height, weight, and BMI.  Blood pressure.  Lipid and cholesterol levels. These may be checked every 5 years, or more frequently if you are over 81 years old.  Skin check.  Lung cancer screening. You may have this screening every year starting at age 78 if you have a 30-pack-year history of smoking and currently smoke or have quit within the past 15 years.  Fecal occult blood test (FOBT) of the stool. You may have this test every year starting at age 65.  Flexible sigmoidoscopy or colonoscopy. You may have a sigmoidoscopy every 5 years or a colonoscopy  every 10 years starting at age 30.  Hepatitis C blood test.  Hepatitis B blood test.  Sexually transmitted disease (STD) testing.  Diabetes screening. This is done by checking your blood sugar (glucose) after you have not eaten for a while (fasting). You may have this done every 1-3 years.  Mammogram. This may be done every 1-2 years. Talk to your health care provider about when you should start having regular mammograms. This may depend on whether you have a family history of breast cancer.  BRCA-related cancer screening. This may be done if you have a family history of breast, ovarian, tubal, or peritoneal cancers.  Pelvic exam and Pap test. This may be done every 3 years starting at age 80. Starting at age 36, this may be done every 5 years if you have a Pap test in combination with an HPV test.  Bone density scan. This is done to screen for osteoporosis. You may have this scan if you are at high risk for osteoporosis.  Discuss your test results, treatment options, and if necessary, the need for more tests with your health care provider. Vaccines Your health care provider may recommend certain vaccines, such as:  Influenza vaccine. This is recommended every year.  Tetanus, diphtheria, and acellular pertussis (Tdap, Td) vaccine. You may need a Td booster every 10 years.  Varicella vaccine. You may need this if you have not been vaccinated.  Zoster vaccine. You may need this after age 5.  Measles, mumps, and rubella (MMR) vaccine. You may need at least one dose of MMR if you were born in  1958 or later. You may also need a second dose.  Pneumococcal 13-valent conjugate (PCV13) vaccine. You may need this if you have certain conditions and were not previously vaccinated.  Pneumococcal polysaccharide (PPSV23) vaccine. You may need one or two doses if you smoke cigarettes or if you have certain conditions.  Meningococcal vaccine. You may need this if you have certain  conditions.  Hepatitis A vaccine. You may need this if you have certain conditions or if you travel or work in places where you may be exposed to hepatitis A.  Hepatitis B vaccine. You may need this if you have certain conditions or if you travel or work in places where you may be exposed to hepatitis B.  Haemophilus influenzae type b (Hib) vaccine. You may need this if you have certain conditions.  Talk to your health care provider about which screenings and vaccines you need and how often you need them. This information is not intended to replace advice given to you by your health care provider. Make sure you discuss any questions you have with your health care provider. Document Released: 07/28/2015 Document Revised: 03/20/2016 Document Reviewed: 05/02/2015 Elsevier Interactive Patient Education  2018 [place] and Stress Management Stress is a normal reaction to life events. It is what you feel when life demands more than you are used to or more than you can handle. Some stress can be useful. For example, the stress reaction can help you catch the last bus of the day, study for a test, or meet a deadline at work. But stress that occurs too often or for too long can cause problems. It can affect your emotional health and interfere with relationships and normal daily activities. Too much stress can weaken your immune system and increase your risk for physical illness. If you already have a medical problem, stress can make it worse. What are the causes? All sorts of life events may cause stress. An event that causes stress for one person may not be stressful for another person. Major life events commonly cause stress. These may be positive or negative. Examples include losing your job, moving into a new home, getting married, having a baby, or losing a loved one. Less obvious life events may also cause stress, especially if they occur day after day or in combination. Examples include  working long hours, driving in traffic, caring for children, being in debt, or being in a difficult relationship. What are the signs or symptoms? Stress may cause emotional symptoms including, the following:  Anxiety. This is feeling worried, afraid, on edge, overwhelmed, or out of control.  Anger. This is feeling irritated or impatient.  Depression. This is feeling sad, down, helpless, or guilty.  Difficulty focusing, remembering, or making decisions.  Stress may cause physical symptoms, including the following:  Aches and pains. These may affect your head, neck, back, stomach, or other areas of your body.  Tight muscles or clenched jaw.  Low energy or trouble sleeping.  Stress may cause unhealthy behaviors, including the following:  Eating to feel better (overeating) or skipping meals.  Sleeping too little, too much, or both.  Working too much or putting off tasks (procrastination).  Smoking, drinking alcohol, or using drugs to feel better.  How is this diagnosed? Stress is diagnosed through an assessment by your health care provider. Your health care provider will ask questions about your symptoms and any stressful life events.Your health care provider will also ask about your medical history and may  order blood tests or other tests. Certain medical conditions and medicine can cause physical symptoms similar to stress. Mental illness can cause emotional symptoms and unhealthy behaviors similar to stress. Your health care provider may refer you to a mental health professional for further evaluation. How is this treated? Stress management is the recommended treatment for stress.The goals of stress management are reducing stressful life events and coping with stress in healthy ways. Techniques for reducing stressful life events include the following:  Stress identification. Self-monitor for stress and identify what causes stress for you. These skills may help you to avoid some  stressful events.  Time management. Set your priorities, keep a calendar of events, and learn to say "no." These tools can help you avoid making too many commitments.  Techniques for coping with stress include the following:  Rethinking the problem. Try to think realistically about stressful events rather than ignoring them or overreacting. Try to find the positives in a stressful situation rather than focusing on the negatives.  Exercise. Physical exercise can release both physical and emotional tension. The key is to find a form of exercise you enjoy and do it regularly.  Relaxation techniques. These relax the body and mind. Examples include yoga, meditation, tai chi, biofeedback, deep breathing, progressive muscle relaxation, listening to music, being out in nature, journaling, and other hobbies. Again, the key is to find one or more that you enjoy and can do regularly.  Healthy lifestyle. Eat a balanced diet, get plenty of sleep, and do not smoke. Avoid using alcohol or drugs to relax.  Strong support network. Spend time with family, friends, or other people you enjoy being around.Express your feelings and talk things over with someone you trust.  Counseling or talktherapy with a mental health professional may be helpful if you are having difficulty managing stress on your own. Medicine is typically not recommended for the treatment of stress.Talk to your health care provider if you think you need medicine for symptoms of stress. Follow these instructions at home:  Keep all follow-up visits as directed by your health care provider.  Take all medicines as directed by your health care provider. Contact a health care provider if:  Your symptoms get worse or you start having new symptoms.  You feel overwhelmed by your problems and can no longer manage them on your own. Get help right away if:  You feel like hurting yourself or someone else. This information is not intended to replace  advice given to you by your health care provider. Make sure you discuss any questions you have with your health care provider. Document Released: 12/25/2000 Document Revised: 12/07/2015 Document Reviewed: 02/23/2013 Elsevier Interactive Patient Education  2017 [employer].

## 2018-01-19 ENCOUNTER — Ambulatory Visit: Payer: Self-pay | Admitting: Obstetrics and Gynecology

## 2018-01-22 ENCOUNTER — Other Ambulatory Visit: Payer: Self-pay

## 2018-01-22 ENCOUNTER — Encounter: Payer: Self-pay | Admitting: Obstetrics and Gynecology

## 2018-01-22 ENCOUNTER — Other Ambulatory Visit (HOSPITAL_COMMUNITY)
Admission: RE | Admit: 2018-01-22 | Discharge: 2018-01-22 | Disposition: A | Discharge status: Home / Self Care | Payer: BC Managed Care – PPO | Source: Ambulatory Visit | Attending: Obstetrics and Gynecology | Admitting: Obstetrics and Gynecology

## 2018-01-22 ENCOUNTER — Ambulatory Visit (INDEPENDENT_AMBULATORY_CARE_PROVIDER_SITE_OTHER): Payer: BC Managed Care – PPO | Admitting: Obstetrics and Gynecology

## 2018-01-22 VITALS — BP 142/76 | HR 88 | Resp 16 | Ht 68.0 in | Wt 255.0 lb

## 2018-01-22 DIAGNOSIS — N926 Irregular menstruation, unspecified: Secondary | ICD-10-CM | POA: Diagnosis not present

## 2018-01-22 DIAGNOSIS — D219 Benign neoplasm of connective and other soft tissue, unspecified: Secondary | ICD-10-CM

## 2018-01-22 DIAGNOSIS — Z01419 Encounter for gynecological examination (general) (routine) without abnormal findings: Secondary | ICD-10-CM

## 2018-01-22 DIAGNOSIS — Z113 Encounter for screening for infections with a predominantly sexual mode of transmission: Secondary | ICD-10-CM | POA: Diagnosis not present

## 2018-01-22 LAB — POCT URINE PREGNANCY: Preg Test, Ur: NEGATIVE

## 2018-01-22 NOTE — Patient Instructions (Signed)
EXERCISE AND DIET:  We recommended that you start or continue a regular exercise program for good health. Regular exercise means any activity that makes your heart beat faster and makes you sweat.  We recommend exercising at least 30 minutes per day at least 3 days a week, preferably 4 or 5.  We also recommend a diet low in fat and sugar.  Inactivity, poor dietary choices and obesity can cause diabetes, heart attack, stroke, and kidney damage, among others.    ALCOHOL AND SMOKING:  Women should limit their alcohol intake to no more than 7 drinks/beers/glasses of wine (combined, not each!) per week. Moderation of alcohol intake to this level decreases your risk of breast cancer and liver damage. And of course, no recreational drugs are part of a healthy lifestyle.  And absolutely no smoking or even second hand smoke. Most people know smoking can cause heart and lung diseases, but did you know it also contributes to weakening of your bones? Aging of your skin?  Yellowing of your teeth and nails?  CALCIUM AND VITAMIN D:  Adequate intake of calcium and Vitamin D are recommended.  The recommendations for exact amounts of these supplements seem to change often, but generally speaking 600 mg of calcium (either carbonate or citrate) and 800 units of Vitamin D per day seems prudent. Certain women may benefit from higher intake of Vitamin D.  If you are among these women, your doctor will have told you during your visit.    PAP SMEARS:  Pap smears, to check for cervical cancer or precancers,  have traditionally been done yearly, although recent scientific advances have shown that most women can have pap smears less often.  However, every woman still should have a physical exam from her gynecologist every year. It will include a breast check, inspection of the vulva and vagina to check for abnormal growths or skin changes, a visual exam of the cervix, and then an exam to evaluate the size and shape of the uterus and  ovaries.  And after 43 years of age, a rectal exam is indicated to check for rectal cancers. We will also provide age appropriate advice regarding health maintenance, like when you should have certain vaccines, screening for sexually transmitted diseases, bone density testing, colonoscopy, mammograms, etc.   MAMMOGRAMS:  All women over 40 years old should have a yearly mammogram. Many facilities now offer a "3D" mammogram, which may cost around $50 extra out of pocket. If possible,  we recommend you accept the option to have the 3D mammogram performed.  It both reduces the number of women who will be called back for extra views which then turn out to be normal, and it is better than the routine mammogram at detecting truly abnormal areas.    COLONOSCOPY:  Colonoscopy to screen for colon cancer is recommended for all women at age 50.  We know, you hate the idea of the prep.  We agree, BUT, having colon cancer and not knowing it is worse!!  Colon cancer so often starts as a polyp that can be seen and removed at colonscopy, which can quite literally save your life!  And if your first colonoscopy is normal and you have no family history of colon cancer, most women don't have to have it again for 10 years.  Once every ten years, you can do something that may end up saving your life, right?  We will be happy to help you get it scheduled when you are ready.    Be sure to check your insurance coverage so you understand how much it will cost.  It may be covered as a preventative service at no cost, but you should check your particular policy.      Uterine Fibroids Uterine fibroids are tissue masses (tumors) that can develop in the womb (uterus). They are also called leiomyomas. This type of tumor is not cancerous (benign) and does not spread to other parts of the body outside of the pelvic area, which is between the hip bones. Occasionally, fibroids may develop in the fallopian tubes, in the cervix, or on the support  structures (ligaments) that surround the uterus. You can have one or many fibroids. Fibroids can vary in size, weight, and where they grow in the uterus. Some can become quite large. Most fibroids do not require medical treatment. What are the causes? A fibroid can develop when a single uterine cell keeps growing (replicating). Most cells in the human body have a control mechanism that keeps them from replicating without control. What are the signs or symptoms? Symptoms may include:  Heavy bleeding during your period.  Bleeding or spotting between periods.  Pelvic pain and pressure.  Bladder problems, such as needing to urinate more often (urinary frequency) or urgently.  Inability to reproduce offspring (infertility).  Miscarriages.  How is this diagnosed? Uterine fibroids are diagnosed through a physical exam. Your health care provider may feel the lumpy tumors during a pelvic exam. Ultrasonography and an MRI may be done to determine the size, location, and number of fibroids. How is this treated? Treatment may include:  Watchful waiting. This involves getting the fibroid checked by your health care provider to see if it grows or shrinks. Follow your health care provider's recommendations for how often to have this checked.  Hormone medicines. These can be taken by mouth or given through an intrauterine device (IUD).  Surgery. ? Removing the fibroids (myomectomy) or the uterus (hysterectomy). ? Removing blood supply to the fibroids (uterine artery embolization).  If fibroids interfere with your fertility and you want to become pregnant, your health care provider may recommend having the fibroids removed. Follow these instructions at home:  Keep all follow-up visits as directed by your health care provider. This is important.  Take over-the-counter and prescription medicines only as told by your health care provider. ? If you were prescribed a hormone treatment, take the  hormone medicines exactly as directed.  Ask your health care provider about taking iron pills and increasing the amount of dark green, leafy vegetables in your diet. These actions can help to boost your blood iron levels, which may be affected by heavy menstrual bleeding.  Pay close attention to your period and tell your health care provider about any changes, such as: ? Increased blood flow that requires you to use more pads or tampons than usual per month. ? A change in the number of days that your period lasts per month. ? A change in symptoms that are associated with your period, such as abdominal cramping or back pain. Contact a health care provider if:  You have pelvic pain, back pain, or abdominal cramps that cannot be controlled with medicines.  You have an increase in bleeding between and during periods.  You soak tampons or pads in a half hour or less.  You feel lightheaded, extra tired, or weak. Get help right away if:  You faint.  You have a sudden increase in pelvic pain. This information is not intended to replace  advice given to you by your health care provider. Make sure you discuss any questions you have with your health care provider. Document Released: 06/28/2000 Document Revised: 02/29/2016 Document Reviewed: 12/28/2013 Elsevier Interactive Patient Education  2018 [employer]. HPV (Human Papillomavirus) Vaccine: What You Need to Know 1. Why get vaccinated? HPV vaccine prevents infection with human papillomavirus (HPV) types that are associated with many cancers, including:  cervical cancer in females,  vaginal and vulvar cancers in females,  anal cancer in females and males,  throat cancer in females and males, and  penile cancer in males.  In addition, HPV vaccine prevents infection with HPV types that cause genital warts in both females and males. In the [place] about 12,000 women get cervical cancer every year, and about 4,000 women die from it. HPV  vaccine can prevent most of these cases of cervical cancer. Vaccination is not a substitute for cervical cancer screening. This vaccine does not protect against all HPV types that can cause cervical cancer. Women should still get regular Pap tests. HPV infection usually comes from sexual contact, and most people will become infected at some point in their life. About 14 million Americans, including teens, get infected every year. Most infections will go away on their own and not cause serious problems. But thousands of women and men get cancer and other diseases from HPV. 2. HPV vaccine HPV vaccine is approved by FDA and is recommended by CDC for both males and females. It is routinely given at 23 or 43 years of age, but it may be given beginning at age 63 years through age 11 years. Most adolescents 9 through 43 years of age should get HPV vaccine as a two-dose series with the doses separated by 6-12 months. People who start HPV vaccination at 47 years of age and older should get the vaccine as a three-dose series with the second dose given 1-2 months after the first dose and the third dose given 6 months after the first dose. There are several exceptions to these age recommendations. Your health care provider can give you more information. 3. Some people should not get this vaccine  Anyone who has had a severe (life-threatening) allergic reaction to a dose of HPV vaccine should not get another dose.  Anyone who has a severe (life threatening) allergy to any component of HPV vaccine should not get the vaccine.  Tell your doctor if you have any severe allergies that you know of, including a severe allergy to yeast.  HPV vaccine is not recommended for pregnant women. If you learn that you were pregnant when you were vaccinated, there is no reason to expect any problems for you or your baby. Any woman who learns she was pregnant when she got HPV vaccine is encouraged to contact the manufacturer's registry  for HPV vaccination during pregnancy at 514-880-5353. Women who are breastfeeding may be vaccinated.  If you have a mild illness, such as a cold, you can probably get the vaccine today. If you are moderately or severely ill, you should probably wait until you recover. Your doctor can advise you. 4. Risks of a vaccine reaction With any medicine, including vaccines, there is a chance of side effects. These are usually mild and go away on their own, but serious reactions are also possible. Most people who get HPV vaccine do not have any serious problems with it. Mild or moderate problems following HPV vaccine:  Reactions in the arm where the shot was given: ? Soreness (  about 9 people in 10) ? Redness or swelling (about 1 person in 3)  Fever: ? Mild (100F) (about 1 person in 10) ? Moderate (102F) (about 1 person in 37)  Other problems: ? Headache (about 1 person in 3) Problems that could happen after any injected vaccine:  People sometimes faint after a medical procedure, including vaccination. Sitting or lying down for about 15 minutes can help prevent fainting, and injuries caused by a fall. Tell your doctor if you feel dizzy, or have vision changes or ringing in the ears.  Some people get severe pain in the shoulder and have difficulty moving the arm where a shot was given. This happens very rarely.  Any medication can cause a severe allergic reaction. Such reactions from a vaccine are very rare, estimated at about 1 in a million doses, and would happen within a few minutes to a few hours after the vaccination. As with any medicine, there is a very remote chance of a vaccine causing a serious injury or death. The safety of vaccines is always being monitored. For more information, visit: http://www.aguilar.org/. 5. What if there is a serious reaction? What should I look for? Look for anything that concerns you, such as signs of a severe allergic reaction, very high fever, or unusual  behavior. Signs of a severe allergic reaction can include hives, swelling of the face and throat, difficulty breathing, a fast heartbeat, dizziness, and weakness. These would usually start a few minutes to a few hours after the vaccination. What should I do? If you think it is a severe allergic reaction or other emergency that can't wait, call 9-1-1 or get to the nearest hospital. Otherwise, call your doctor. Afterward, the reaction should be reported to the Vaccine Adverse Event Reporting System (VAERS). Your doctor should file this report, or you can do it yourself through the VAERS web site at www.vaers.SamedayNews.es, or by calling 825-806-1189. VAERS does not give medical advice. 6. The National Vaccine Injury Compensation Program The [employer] Vaccine Injury Compensation Program (VICP) is a federal program that was created to compensate people who may have been injured by certain vaccines. Persons who believe they may have been injured by a vaccine can learn about the program and about filing a claim by calling (548)222-6358 or visiting the [place] website at GoldCloset.com.ee. There is a time limit to file a claim for compensation. 7. How can I learn more?  Ask your health care provider. He or she can give you the vaccine package insert or suggest other sources of information.  Call your local or state health department.  Contact the Centers for Disease Control and Prevention (CDC): ? Call 559-285-0218 (1-800-CDC-INFO) or ? Visit CDC's website at http://sweeney-todd.com/ Vaccine Information Statement, HPV Vaccine (06/16/2015) This information is not intended to replace advice given to you by your health care provider. Make sure you discuss any questions you have with your health care provider. Document Released: 01/26/2014 Document Revised: 03/21/2016 Document Reviewed: 03/21/2016 Elsevier Interactive Patient Education  2017 [employer].

## 2018-01-22 NOTE — Progress Notes (Signed)
GYNECOLOGY  VISIT   HPI: 42 y.o.   Separated  African American  female   [place] with Patient's last menstrual period was 01/12/2018 (exact date).   Here as a new patient to discuss irregular menses. Patient referred from Grier Mitts, MD. Patient states that she has uterine fibroids.     Usually has monthly menses.  In March had menses for more than 10 days.  Was treated with medication for a week to stop cycle by her PCP.  Follow up periods were regular.  Cycles are heavy with clotting. Pad or tampon change at home every 2 - 3 hours.  At works she has to double up on protection to prevent staining.  Normally has some cramping in back or side.  Some leg pain with cycles.   Anemia with Hgb 8.2 1 month ago.  Placed on iron and now is 12.0.   Does not want to have menses.  Declines future childbearing.   Wants STD screening.   CT 12/26/14 - multiple fibroids.  17 mm hypoatenuating lesion head of pancreas and midline ventral hernia.   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  [facility] MRI of abdomen 01/22/16 - pseudocysts of pancreas - benign.   Had hip replacement on right.  May need left hip replacement as well.   From [place], [place].  Works for [employer].   PCP - Dr. Volanda Napoleon, [place]  GYNECOLOGIC HISTORY: Patient's last menstrual period was 01/12/2018 (exact date). Contraception:  none Menopausal hormone therapy:  none Last mammogram:  none Last pap smear:   02/08/13 Pap smear normal.  All paps have been normal.        OB History    Gravida  1   Para  1   Term  0   Preterm  0   AB  0   Living  1     SAB  0   TAB  0   Ectopic  0   Multiple  0   Live Births  0              Patient Active Problem List   Diagnosis Date Noted  . 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 Unilateral primary osteoarthritis, left hip 11/06/2017  . Unilateral primary osteoarthritis, right hip  11/06/2017  . Lumbosacral radiculopathy at S1 09/12/2017  . Back pain 08/19/2017  . 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  . Abnormal gait 08/19/2017  . Obesity 08/19/2017  . Ventral hernia s/p laparoscopic repair with mesh 02/27/15 02/27/2015    Past Medical History:  Diagnosis Date  . Anemia   . Arthritis   . Back pain    lower back   . Complication of anesthesia    age 89 had twilight anesthesia and woke up suring surgery   . Depression   . Hypertension   . Pinched nerve    in back    Past Surgical History:  Procedure Laterality Date  . benign breast cyst removed    . CHOLECYSTECTOMY    . INSERTION OF MESH N/A 02/27/2015   Procedure: INSERTION OF MESH;  Surgeon: Jackolyn Confer, MD;  Location: [facility];  Service: General;  Laterality: N/A;  . LAPAROSCOPIC ASSISTED VENTRAL HERNIA REPAIR N/A 02/27/2015   Procedure: LAPAROSCOPIC VENTRAL HERNIA REPAIR WITH MESH;  Surgeon: Jackolyn Confer, MD;  Location: [facility];  Service: General;  Laterality: N/A;  . THYROIDECTOMY, PARTIAL    . TOTAL HIP ARTHROPLASTY Right 11/28/2017  Procedure: RIGHT TOTAL HIP ARTHROPLASTY ANTERIOR APPROACH;  Surgeon: Mcarthur Rossetti, MD;  Location: [facility];  Service: Orthopedics;  Laterality: Right;    Current Outpatient Medications  Medication Sig Dispense Refill  . amLODipine-benazepril (LOTREL) 10-20 MG capsule TAKE 1 CAPSULE BY MOUTH DAILY 90 capsule 0  . celecoxib (CELEBREX) 200 MG capsule Take 200 mg by mouth daily.    . DULoxetine (CYMBALTA) 30 MG capsule TAKE 1 CAPSULE BY MOUTH TWICE DAILY 60 capsule 2  . ferrous sulfate 325 (65 FE) MG tablet Take 1 tablet (325 mg total) by mouth daily with breakfast. 90 tablet 3  . HYDROcodone-acetaminophen (NORCO/VICODIN) 5-325 MG tablet Take 1 tablet by mouth every 6 (six) hours as needed for moderate pain. 30 tablet 0  . methocarbamol (ROBAXIN) 500 MG tablet Take 1 tablet (500 mg total) by mouth every 6 (six) hours as needed for muscle spasms. 60 tablet 0   No  current facility-administered medications for this visit.      ALLERGIES: Patient has no known allergies.  Family History  Problem Relation Age of Onset  . Hypertension Father   . Arthritis Father   . Clotting disorder Father   . Prostate cancer Paternal Grandfather     Social History   Socioeconomic History  . Marital status: Married    Spouse name: Not on file  . Number of children: Not on file  . Years of education: Not on file  . Highest education level: Not on file  Occupational History  . Not on file  Social Needs  . Financial resource strain: Not on file  . Food insecurity:    Worry: Not on file    Inability: Not on file  . Transportation needs:    Medical: Not on file    Non-medical: Not on file  Tobacco Use  . Smoking status: Former Smoker    Types: Cigarettes    Last attempt to quit: 06/14/2017    Years since quitting: 0.6  . Smokeless tobacco: Never Used  Substance and Sexual Activity  . Alcohol use: Yes    Comment: occasionally  . Drug use: Yes    Types: Marijuana  . Sexual activity: Yes    Birth control/protection: None  Lifestyle  . Physical activity:    Days per week: Not on file    Minutes per session: Not on file  . Stress: Not on file  Relationships  . Social connections:    Talks on phone: Not on file    Gets together: Not on file    Attends religious service: Not on file    Active member of club or organization: Not on file    Attends meetings of clubs or organizations: Not on file    Relationship status: Not on file  . Intimate partner violence:    Fear of current or ex partner: Not on file    Emotionally abused: Not on file    Physically abused: Not on file    Forced sexual activity: Not on file  Other Topics Concern  . Not on file  Social History Narrative  . Not on file    Review of Systems  Constitutional: Negative.   HENT: Negative.   Eyes: Negative.   Respiratory: Negative.   Cardiovascular: Negative.    Gastrointestinal: Negative.   Endocrine: Negative.   Genitourinary: Negative.   Musculoskeletal: Negative.   Skin: Negative.   Allergic/Immunologic: Negative.   Neurological: Negative.   Hematological: Negative.   Psychiatric/Behavioral: Negative.  PHYSICAL EXAMINATION:    BP (!) 142/76 (BP Location: Right Arm, Patient Position: Sitting, Cuff Size: Large)   Pulse 88   Resp 16   Ht 5\' 8"  (1.727 m)   Wt 255 lb (115.7 kg)   LMP 01/12/2018 (Exact Date)   BMI 38.77 kg/m     General appearance: alert, cooperative and appears stated age Head: Normocephalic, without obvious abnormality, atraumatic Neck: no adenopathy, supple, symmetrical, trachea midline and thyroid normal to inspection and palpation Lungs: clear to auscultation bilaterally Breasts: normal appearance, no masses or tenderness, No nipple retraction or dimpling, No nipple discharge or bleeding, No axillary or supraclavicular adenopathy Heart: regular rate and rhythm Abdomen: soft, non-tender, no masses,  no organomegaly Extremities: extremities normal, atraumatic, no cyanosis or edema Skin: Skin color, texture, turgor normal. No rashes or lesions Lymph nodes: Cervical, supraclavicular, and axillary nodes normal. No abnormal inguinal nodes palpated Neurologic: Grossly normal  Pelvic: External genitalia:  no lesions              Urethra:  normal appearing urethra with no masses, tenderness or lesions              Bartholins and Skenes: normal                 Vagina: normal appearing vagina with normal color and discharge, no lesions              Cervix: no lesions                Bimanual Exam:  Uterus:  13 week size, globular, nontender.               Adnexa: no mass, fullness, tenderness              Rectal exam: Yes.  .  Confirms.              Anus:  normal sphincter tone, no lesions  Chaperone was present for exam.  ASSESSMENT  Well woman visit.  Fibroids.  Anemia resolved with iron therapy.  Pancreatic  mass.  Ventral hernia repair with mesh of abdominal wall.   PLAN  Pap and HR HPV testing.  STD screening.  She will schedule mammogram.  Information regarding imaging centers to patients.  Discussed Gardasil vaccine.  Return for pelvic US.  We talked about treatment for fibroids - hormonal contraception, uterine artery embolization, hysterectomy.   An After Visit Summary was printed and given to the patient.

## 2018-01-23 LAB — HEPATITIS C ANTIBODY

## 2018-01-23 LAB — HEP, RPR, HIV PANEL
HEP B S AG: NEGATIVE
HIV SCREEN 4TH GENERATION: NONREACTIVE
RPR Ser Ql: NONREACTIVE

## 2018-01-26 LAB — CYTOLOGY - PAP
Chlamydia: NEGATIVE
DIAGNOSIS: REACTIVE
Diagnosis: NEGATIVE
HPV (WINDOPATH): NOT DETECTED
Neisseria Gonorrhea: NEGATIVE
TRICH (WINDOWPATH): NEGATIVE

## 2018-01-28 ENCOUNTER — Ambulatory Visit (INDEPENDENT_AMBULATORY_CARE_PROVIDER_SITE_OTHER): Payer: Self-pay

## 2018-01-28 ENCOUNTER — Encounter (INDEPENDENT_AMBULATORY_CARE_PROVIDER_SITE_OTHER): Payer: Self-pay | Admitting: Physical Medicine and Rehabilitation

## 2018-01-28 ENCOUNTER — Ambulatory Visit (INDEPENDENT_AMBULATORY_CARE_PROVIDER_SITE_OTHER): Payer: BC Managed Care – PPO | Admitting: Physical Medicine and Rehabilitation

## 2018-01-28 DIAGNOSIS — M25552 Pain in left hip: Secondary | ICD-10-CM | POA: Diagnosis not present

## 2018-01-28 NOTE — Progress Notes (Signed)
Rachel Warren - 42 y.o. female MRN [id number]  Date of birth: January 06, 1976  Office Visit Note: Visit Date: 01/28/2018 PCP: Billie Ruddy, MD Referred by: Billie Ruddy, MD  Subjective: Chief Complaint  Patient presents with  . Left Hip - Pain  . Left Leg - Pain   HPI: Rachel Warren is a 42 year old female with chronic worsening left hip and groin pain radiating to the knee.  She has known severe arthritis.  She has prior right total hip arthroplasty.  She comes in today at the request of Dr. Jean Rosenthal for diagnostic of therapeutic anesthetic arthrogram of the left hip.   ROS Otherwise per HPI.  Assessment & Plan: Visit Diagnoses:  1. Pain in left hip     Plan: Findings:  Patient did get relief during the anesthetic phase of the injection.    Meds & Orders: No orders of the defined types were placed in this encounter.   Orders Placed This Encounter  Procedures  . Large Joint Inj: L hip joint  . XR C-ARM NO REPORT    Follow-up: Return if symptoms worsen or fail to improve, for Dr. Ninfa Linden.   Procedures: Large Joint Inj: L hip joint on 01/28/2018 1:57 PM Indications: pain and diagnostic evaluation Details: 22 G needle, anterior approach  Arthrogram: Yes  Medications: 80 mg triamcinolone acetonide 40 MG/ML; 3 mL bupivacaine 0.5 % Outcome: tolerated well, no immediate complications  Arthrogram demonstrated excellent flow of contrast throughout the joint surface without extravasation or obvious defect.  The patient had relief of symptoms during the anesthetic phase of the injection.  Procedure, treatment alternatives, risks and benefits explained, specific risks discussed. Consent was given by the patient. Immediately prior to procedure a time out was called to verify the correct patient, procedure, equipment, support staff and site/side marked as required. Patient was prepped and draped in the usual sterile fashion.      No notes on file    Clinical History: No specialty comments available.   She reports that she quit smoking about 7 months ago. Her smoking use included cigarettes. She has never used smokeless tobacco.  Recent Labs    01/14/18 0941  HGBA1C 4.8    Objective:  VS:  HT:    WT:   BMI:     BP:   HR: bpm  TEMP: ( )  RESP:  Physical Exam  Ortho Exam Imaging: No results found.  Past Medical/Family/Surgical/Social History: Medications & Allergies reviewed per EMR, new medications updated. Patient Active Problem List   Diagnosis Date Noted  . Fibroids 01/22/2018  . 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 Unilateral primary osteoarthritis, left hip 11/06/2017  . Unilateral primary osteoarthritis, right hip 11/06/2017  . Lumbosacral radiculopathy at S1 09/12/2017  . Back pain 08/19/2017  . 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  . Abnormal gait 08/19/2017  . Obesity 08/19/2017  . Ventral hernia s/p laparoscopic repair with mesh 02/27/15 02/27/2015   Past Medical History:  Diagnosis Date  . Anemia   . Arthritis   . Back pain    lower back   . Complication of anesthesia    age 45 had twilight anesthesia and woke up suring surgery   . Depression   . Fibroids   . Hypertension   . Pinched nerve    in back   Family History  Problem Relation Age of Onset  . Hypertension Father   . Arthritis Father   . Clotting disorder Father   .  Prostate cancer Paternal Grandfather    Past Surgical History:  Procedure Laterality Date  . benign breast cyst removed    . CHOLECYSTECTOMY    . INSERTION OF MESH N/A 02/27/2015   Procedure: INSERTION OF MESH;  Surgeon: Jackolyn Confer, MD;  Location: [facility];  Service: General;  Laterality: N/A;  . LAPAROSCOPIC ASSISTED VENTRAL HERNIA REPAIR N/A 02/27/2015   Procedure: LAPAROSCOPIC VENTRAL HERNIA REPAIR WITH MESH;  Surgeon: Jackolyn Confer, MD;  Location: [facility];  Service: General;  Laterality: N/A;  . THYROIDECTOMY, PARTIAL    . TOTAL HIP  ARTHROPLASTY Right 11/28/2017   Procedure: RIGHT TOTAL HIP ARTHROPLASTY ANTERIOR APPROACH;  Surgeon: Mcarthur Rossetti, MD;  Location: [facility];  Service: Orthopedics;  Laterality: Right;   Social History   Occupational History  . Not on file  Tobacco Use  . Smoking status: Former Smoker    Types: Cigarettes    Last attempt to quit: 06/14/2017    Years since quitting: 0.6  . Smokeless tobacco: Never Used  Substance and Sexual Activity  . Alcohol use: Yes    Comment: occasionally  . Drug use: Yes    Types: Marijuana  . Sexual activity: Yes    Birth control/protection: None

## 2018-01-28 NOTE — Progress Notes (Signed)
 .  Numeric Pain Rating Scale and Functional Assessment Average Pain 6   In the last MONTH (on 0-10 scale) has pain interfered with the following?  1. General activity like being  able to carry out your everyday physical activities such as walking, climbing stairs, carrying groceries, or moving a chair?  Rating(5)  -Dye Allergies.  

## 2018-01-28 NOTE — Patient Instructions (Signed)

## 2018-01-29 ENCOUNTER — Ambulatory Visit (INDEPENDENT_AMBULATORY_CARE_PROVIDER_SITE_OTHER): Payer: BC Managed Care – PPO

## 2018-01-29 ENCOUNTER — Other Ambulatory Visit: Payer: Self-pay

## 2018-01-29 ENCOUNTER — Ambulatory Visit (INDEPENDENT_AMBULATORY_CARE_PROVIDER_SITE_OTHER): Payer: BC Managed Care – PPO | Admitting: Obstetrics and Gynecology

## 2018-01-29 ENCOUNTER — Other Ambulatory Visit: Payer: BC Managed Care – PPO

## 2018-01-29 ENCOUNTER — Encounter: Payer: Self-pay | Admitting: Obstetrics and Gynecology

## 2018-01-29 VITALS — BP 160/92 | HR 82 | Resp 14 | Ht 68.0 in | Wt 252.0 lb

## 2018-01-29 DIAGNOSIS — D219 Benign neoplasm of connective and other soft tissue, unspecified: Secondary | ICD-10-CM | POA: Diagnosis not present

## 2018-01-29 DIAGNOSIS — N921 Excessive and frequent menstruation with irregular cycle: Secondary | ICD-10-CM

## 2018-01-29 DIAGNOSIS — N83202 Unspecified ovarian cyst, left side: Secondary | ICD-10-CM

## 2018-01-29 DIAGNOSIS — N83201 Unspecified ovarian cyst, right side: Secondary | ICD-10-CM

## 2018-01-29 NOTE — Progress Notes (Signed)
GYNECOLOGY  VISIT   HPI: 42 y.o.   Married  [place] American  female   [place] with Patient's last menstrual period was 01/11/2018.   here for   Pelvic ultrasound.  Last smokes cigarettes in August 2018.   Used Depo Provera in the past and had hair loss.   Last intercourse was maybe in March 2019.  Not after that.   Would like to avoid hysterectomy.  GYNECOLOGIC HISTORY: Patient's last menstrual period was 01/11/2018. Contraception:  None.   Menopausal hormone therapy:  none Last mammogram:  None.  Not scheduled yet.  Last pap smear:   01-22-18 negative, HR HPV negative         OB History    Gravida  1   Para  1   Term  0   Preterm  0   AB  0   Living  1     SAB  0   TAB  0   Ectopic  0   Multiple  0   Live Births  0              Patient Active Problem List   Diagnosis Date Noted  . Fibroids 01/22/2018  . 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 Unilateral primary osteoarthritis, left hip 11/06/2017  . Unilateral primary osteoarthritis, right hip 11/06/2017  . Lumbosacral radiculopathy at S1 09/12/2017  . Back pain 08/19/2017  . 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  . Abnormal gait 08/19/2017  . Obesity 08/19/2017  . Ventral hernia s/p laparoscopic repair with mesh 02/27/15 02/27/2015    Past Medical History:  Diagnosis Date  . Anemia   . Arthritis   . Back pain    lower back   . Complication of anesthesia    age 62 had twilight anesthesia and woke up suring surgery   . Depression   . Fibroids   . Hypertension   . Pinched nerve    in back    Past Surgical History:  Procedure Laterality Date  . benign breast cyst removed    . CHOLECYSTECTOMY    . INSERTION OF MESH N/A 02/27/2015   Procedure: INSERTION OF MESH;  Surgeon: Jackolyn Confer, MD;  Location: [facility];  Service: General;  Laterality: N/A;  . LAPAROSCOPIC ASSISTED VENTRAL HERNIA REPAIR N/A 02/27/2015   Procedure: LAPAROSCOPIC VENTRAL HERNIA REPAIR WITH MESH;   Surgeon: Jackolyn Confer, MD;  Location: [facility];  Service: General;  Laterality: N/A;  . THYROIDECTOMY, PARTIAL    . TOTAL HIP ARTHROPLASTY Right 11/28/2017   Procedure: RIGHT TOTAL HIP ARTHROPLASTY ANTERIOR APPROACH;  Surgeon: Mcarthur Rossetti, MD;  Location: [facility];  Service: Orthopedics;  Laterality: Right;    Current Outpatient Medications  Medication Sig Dispense Refill  . amLODipine-benazepril (LOTREL) 10-20 MG capsule TAKE 1 CAPSULE BY MOUTH DAILY 90 capsule 0  . celecoxib (CELEBREX) 200 MG capsule Take 200 mg by mouth daily.    . DULoxetine (CYMBALTA) 30 MG capsule TAKE 1 CAPSULE BY MOUTH TWICE DAILY 60 capsule 2  . ferrous sulfate 325 (65 FE) MG tablet Take 1 tablet (325 mg total) by mouth daily with breakfast. 90 tablet 3  . HYDROcodone-acetaminophen (NORCO/VICODIN) 5-325 MG tablet Take 1 tablet by mouth every 6 (six) hours as needed for moderate pain. 30 tablet 0  . ibuprofen (ADVIL,MOTRIN) 800 MG tablet Take 800 mg by mouth as needed.    . methocarbamol (ROBAXIN) 500 MG tablet Take 1 tablet (500 mg total) by mouth  every 6 (six) hours as needed for muscle spasms. 60 tablet 0   No current facility-administered medications for this visit.      ALLERGIES: Patient has no known allergies.  Family History  Problem Relation Age of Onset  . Hypertension Father   . Arthritis Father   . Clotting disorder Father   . Prostate cancer Paternal Grandfather     Social History   Socioeconomic History  . Marital status: Married    Spouse name: Not on file  . Number of children: Not on file  . Years of education: Not on file  . Highest education level: Not on file  Occupational History  . Not on file  Social Needs  . Financial resource strain: Not on file  . Food insecurity:    Worry: Not on file    Inability: Not on file  . Transportation needs:    Medical: Not on file    Non-medical: Not on file  Tobacco Use  . Smoking status: Former Smoker    Types: Cigarettes     Last attempt to quit: 06/14/2017    Years since quitting: 0.6  . Smokeless tobacco: Never Used  Substance and Sexual Activity  . Alcohol use: Yes    Comment: occasionally  . Drug use: Yes    Types: Marijuana  . Sexual activity: Yes    Birth control/protection: None  Lifestyle  . Physical activity:    Days per week: Not on file    Minutes per session: Not on file  . Stress: Not on file  Relationships  . Social connections:    Talks on phone: Not on file    Gets together: Not on file    Attends religious service: Not on file    Active member of club or organization: Not on file    Attends meetings of clubs or organizations: Not on file    Relationship status: Not on file  . Intimate partner violence:    Fear of current or ex partner: Not on file    Emotionally abused: Not on file    Physically abused: Not on file    Forced sexual activity: Not on file  Other Topics Concern  . Not on file  Social History Narrative  . Not on file    Review of Systems  Constitutional: Negative.   HENT: Negative.   Eyes: Negative.   Respiratory: Negative.   Cardiovascular: Negative.   Gastrointestinal: Negative.   Endocrine: Negative.   Genitourinary: Negative.   Musculoskeletal: Negative.   Skin: Negative.   Allergic/Immunologic: Negative.   Neurological: Negative.   Hematological: Negative.   Psychiatric/Behavioral: Negative.     PHYSICAL EXAMINATION:    BP (!) 160/92 (BP Location: Right Arm, Patient Position: Sitting, Cuff Size: Large)   Pulse 82   Resp 14   Ht 5\' 8"  (1.727 m)   Wt 252 lb (114.3 kg)   LMP 01/11/2018   BMI 38.32 kg/m     General appearance: alert, cooperative and appears stated age    Pelvic US Uterus with 9 fibroid, largest 4.38 EMS 10.6. Ovaries -  Right - simple cyst: 60 x 55 x 50 mm, simple cyst: 28 x 22 x 19 mm. Left - thick walled cyst 36 x 37 x 37 mm, consistent with hemorrhagic cyst. No free fluid.   EMB Sterile prep with Hibiclens.     Tenaculum to anterior cervical lip.  Os finder used.  Pipelle passed x 1 to 10 cm.  Tissue to  pathology.  No complications. Minimal EBL.  Chaperone was present for exam.  ASSESSMENT  Menorrhagia with irregular cycle. Fibroids.  Ovarian cysts.  Simple and hemorrhagic.  Right ovary is considerably enlarged due to cyst formation.  Dysmenorrhea.  Anemia controlled with iron.  HTN.   PLAN  Discussed fibroids, ovarian cysts, endometriosis.  EMB follow up.  Plan for possible Micronor or Aygestin.  Pelvic US in 6 weeks to recheck ovarian cysts.  Torsion precautions given.  An After Visit Summary was printed and given to the patient.  __25____ minutes face to face time of which over 50% was spent in counseling.

## 2018-01-29 NOTE — Progress Notes (Deleted)
GYNECOLOGY  VISIT   HPI: 42 y.o.   Married  [place] American  female   [place] with Patient's last menstrual period was 01/11/2018.   here for   Pelvic ultrasound   GYNECOLOGIC HISTORY: Patient's last menstrual period was 01/11/2018. Contraception:  none Menopausal hormone therapy:  none Last mammogram:  none Last pap smear:   01-22-18 negative, HR HPV negative         OB History    Gravida  1   Para  1   Term  0   Preterm  0   AB  0   Living  1     SAB  0   TAB  0   Ectopic  0   Multiple  0   Live Births  0              Patient Active Problem List   Diagnosis Date Noted  . Fibroids 01/22/2018  . 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 Unilateral primary osteoarthritis, left hip 11/06/2017  . Unilateral primary osteoarthritis, right hip 11/06/2017  . Lumbosacral radiculopathy at S1 09/12/2017  . Back pain 08/19/2017  . 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  . Abnormal gait 08/19/2017  . Obesity 08/19/2017  . Ventral hernia s/p laparoscopic repair with mesh 02/27/15 02/27/2015    Past Medical History:  Diagnosis Date  . Anemia   . Arthritis   . Back pain    lower back   . Complication of anesthesia    age 20 had twilight anesthesia and woke up suring surgery   . Depression   . Fibroids   . Hypertension   . Pinched nerve    in back    Past Surgical History:  Procedure Laterality Date  . benign breast cyst removed    . CHOLECYSTECTOMY    . INSERTION OF MESH N/A 02/27/2015   Procedure: INSERTION OF MESH;  Surgeon: Jackolyn Confer, MD;  Location: [facility];  Service: General;  Laterality: N/A;  . LAPAROSCOPIC ASSISTED VENTRAL HERNIA REPAIR N/A 02/27/2015   Procedure: LAPAROSCOPIC VENTRAL HERNIA REPAIR WITH MESH;  Surgeon: Jackolyn Confer, MD;  Location: [facility];  Service: General;  Laterality: N/A;  . THYROIDECTOMY, PARTIAL    . TOTAL HIP ARTHROPLASTY Right 11/28/2017   Procedure: RIGHT TOTAL HIP ARTHROPLASTY ANTERIOR APPROACH;  Surgeon:  Mcarthur Rossetti, MD;  Location: [facility];  Service: Orthopedics;  Laterality: Right;    Current Outpatient Medications  Medication Sig Dispense Refill  . amLODipine-benazepril (LOTREL) 10-20 MG capsule TAKE 1 CAPSULE BY MOUTH DAILY 90 capsule 0  . celecoxib (CELEBREX) 200 MG capsule Take 200 mg by mouth daily.    . DULoxetine (CYMBALTA) 30 MG capsule TAKE 1 CAPSULE BY MOUTH TWICE DAILY 60 capsule 2  . ferrous sulfate 325 (65 FE) MG tablet Take 1 tablet (325 mg total) by mouth daily with breakfast. 90 tablet 3  . HYDROcodone-acetaminophen (NORCO/VICODIN) 5-325 MG tablet Take 1 tablet by mouth every 6 (six) hours as needed for moderate pain. 30 tablet 0  . ibuprofen (ADVIL,MOTRIN) 800 MG tablet Take 800 mg by mouth as needed.    . methocarbamol (ROBAXIN) 500 MG tablet Take 1 tablet (500 mg total) by mouth every 6 (six) hours as needed for muscle spasms. 60 tablet 0   No current facility-administered medications for this visit.      ALLERGIES: Patient has no known allergies.  Family History  Problem Relation Age of Onset  . Hypertension Father   .  Arthritis Father   . Clotting disorder Father   . Prostate cancer Paternal Grandfather     Social History   Socioeconomic History  . Marital status: Married    Spouse name: Not on file  . Number of children: Not on file  . Years of education: Not on file  . Highest education level: Not on file  Occupational History  . Not on file  Social Needs  . Financial resource strain: Not on file  . Food insecurity:    Worry: Not on file    Inability: Not on file  . Transportation needs:    Medical: Not on file    Non-medical: Not on file  Tobacco Use  . Smoking status: Former Smoker    Types: Cigarettes    Last attempt to quit: 06/14/2017    Years since quitting: 0.6  . Smokeless tobacco: Never Used  Substance and Sexual Activity  . Alcohol use: Yes    Comment: occasionally  . Drug use: Yes    Types: Marijuana  . Sexual  activity: Yes    Birth control/protection: None  Lifestyle  . Physical activity:    Days per week: Not on file    Minutes per session: Not on file  . Stress: Not on file  Relationships  . Social connections:    Talks on phone: Not on file    Gets together: Not on file    Attends religious service: Not on file    Active member of club or organization: Not on file    Attends meetings of clubs or organizations: Not on file    Relationship status: Not on file  . Intimate partner violence:    Fear of current or ex partner: Not on file    Emotionally abused: Not on file    Physically abused: Not on file    Forced sexual activity: Not on file  Other Topics Concern  . Not on file  Social History Narrative  . Not on file    Review of Systems  Constitutional: Negative.   HENT: Negative.   Eyes: Negative.   Respiratory: Negative.   Cardiovascular: Negative.   Gastrointestinal: Negative.   Endocrine: Negative.   Genitourinary: Negative.   Musculoskeletal: Negative.   Skin: Negative.   Allergic/Immunologic: Negative.   Neurological: Negative.   Hematological: Negative.   Psychiatric/Behavioral: Negative.     PHYSICAL EXAMINATION:    BP (!) 160/92 (BP Location: Right Arm, Patient Position: Sitting, Cuff Size: Large)   Pulse 82   Resp 14   Ht 5\' 8"  (1.727 m)   Wt 252 lb (114.3 kg)   LMP 01/11/2018   BMI 38.32 kg/m     General appearance: alert, cooperative and appears stated age Head: Normocephalic, without obvious abnormality, atraumatic Neck: no adenopathy, supple, symmetrical, trachea midline and thyroid normal to inspection and palpation Lungs: clear to auscultation bilaterally Breasts: normal appearance, no masses or tenderness, No nipple retraction or dimpling, No nipple discharge or bleeding, No axillary or supraclavicular adenopathy Heart: regular rate and rhythm Abdomen: soft, non-tender, no masses,  no organomegaly Extremities: extremities normal, atraumatic, no  cyanosis or edema Skin: Skin color, texture, turgor normal. No rashes or lesions Lymph nodes: Cervical, supraclavicular, and axillary nodes normal. No abnormal inguinal nodes palpated Neurologic: Grossly normal  Pelvic: External genitalia:  no lesions              Urethra:  normal appearing urethra with no masses, tenderness or lesions  Bartholins and Skenes: normal                 Vagina: normal appearing vagina with normal color and discharge, no lesions              Cervix: no lesions                Bimanual Exam:  Uterus:  normal size, contour, position, consistency, mobility, non-tender              Adnexa: no mass, fullness, tenderness              Rectal exam: {yes no:[id number]}.  Confirms.              Anus:  normal sphincter tone, no lesions  Chaperone was present for exam.  ASSESSMENT     PLAN     An After Visit Summary was printed and given to the patient.  ______ minutes face to face time of which over 50% was spent in counseling.

## 2018-01-29 NOTE — Patient Instructions (Signed)
Endometrial Biopsy, Care After This sheet gives you information about how to care for yourself after your procedure. Your health care provider may also give you more specific instructions. If you have problems or questions, contact your health care provider. What can I expect after the procedure? After the procedure, it is common to have:  Mild cramping.  A small amount of vaginal bleeding for a few days. This is normal.  Follow these instructions at home:  Take over-the-counter and prescription medicines only as told by your health care provider.  Do not douche, use tampons, or have sexual intercourse until your health care provider approves.  Return to your normal activities as told by your health care provider. Ask your health care provider what activities are safe for you.  Follow instructions from your health care provider about any activity restrictions, such as restrictions on strenuous exercise or heavy lifting. Contact a health care provider if:  You have heavy bleeding, or bleed for longer than 2 days after the procedure.  You have bad smelling discharge from your vagina.  You have a fever or chills.  You have a burning sensation when urinating or you have difficulty urinating.  You have severe pain in your lower abdomen. Get help right away if:  You have severe cramps in your stomach or back.  You pass large blood clots.  Your bleeding increases.  You become weak or light-headed, or you pass out. Summary  After the procedure, it is common to have mild cramping and a small amount of vaginal bleeding for a few days.  Do not douche, use tampons, or have sexual intercourse until your health care provider approves.  Return to your normal activities as told by your health care provider. Ask your health care provider what activities are safe for you. This information is not intended to replace advice given to you by your health care provider. Make sure you discuss any  questions you have with your health care provider. Document Released: 04/21/2013 Document Revised: 07/17/2016 Document Reviewed: 07/17/2016 Elsevier Interactive Patient Education  2017 [employer]. Ovarian Cyst An ovarian cyst is a fluid-filled sac that forms on an ovary. The ovaries are small organs that produce eggs in women. Various types of cysts can form on the ovaries. Some may cause symptoms and require treatment. Most ovarian cysts go away on their own, are not cancerous (are benign), and do not cause problems. Common types of ovarian cysts include:  Functional (follicle) cysts. ? Occur during the menstrual cycle, and usually go away with the next menstrual cycle if you do not get pregnant. ? Usually cause no symptoms.  Endometriomas. ? Are cysts that form from the tissue that lines the uterus (endometrium). ? Are sometimes called "chocolate cysts" because they become filled with blood that turns brown. ? Can cause pain in the lower abdomen during intercourse and during your period.  Cystadenoma cysts. ? Develop from cells on the outside surface of the ovary. ? Can get very large and cause lower abdomen pain and pain with intercourse. ? Can cause severe pain if they twist or break open (rupture).  Dermoid cysts. ? Are sometimes found in both ovaries. ? May contain different kinds of body tissue, such as skin, teeth, hair, or cartilage. ? Usually do not cause symptoms unless they get very big.  Theca lutein cysts. ? Occur when too much of a certain hormone (human chorionic gonadotropin) is produced and overstimulates the ovaries to produce an egg. ? Are most  common after having procedures used to assist with the conception of a baby (in vitro fertilization).  What are the causes? Ovarian cysts may be caused by:  Ovarian hyperstimulation syndrome. This is a condition that can develop from taking fertility medicines. It causes multiple large ovarian cysts to  form.  Polycystic ovarian syndrome (PCOS). This is a common hormonal disorder that can cause ovarian cysts, as well as problems with your period or fertility.  What increases the risk? The following factors may make you more likely to develop ovarian cysts:  Being overweight or obese.  Taking fertility medicines.  Taking certain forms of hormonal birth control.  Smoking.  What are the signs or symptoms? Many ovarian cysts do not cause symptoms. If symptoms are present, they may include:  Pelvic pain or pressure.  Pain in the lower abdomen.  Pain during sex.  Abdominal swelling.  Abnormal menstrual periods.  Increasing pain with menstrual periods.  How is this diagnosed? These cysts are commonly found during a routine pelvic exam. You may have tests to find out more about the cyst, such as:  Ultrasound.  X-ray of the pelvis.  CT scan.  MRI.  Blood tests.  How is this treated? Many ovarian cysts go away on their own without treatment. Your health care provider may want to check your cyst regularly for 2-3 months to see if it changes. If you are in menopause, it is especially important to have your cyst monitored closely because menopausal women have a higher rate of ovarian cancer. When treatment is needed, it may include:  Medicines to help relieve pain.  A procedure to drain the cyst (aspiration).  Surgery to remove the whole cyst.  Hormone treatment or birth control pills. These methods are sometimes used to help dissolve a cyst.  Follow these instructions at home:  Take over-the-counter and prescription medicines only as told by your health care provider.  Do not drive or use heavy machinery while taking prescription pain medicine.  Get regular pelvic exams and Pap tests as often as told by your health care provider.  Return to your normal activities as told by your health care provider. Ask your health care provider what activities are safe for  you.  Do not use any products that contain nicotine or tobacco, such as cigarettes and e-cigarettes. If you need help quitting, ask your health care provider.  Keep all follow-up visits as told by your health care provider. This is important. Contact a health care provider if:  Your periods are late, irregular, or painful, or they stop.  You have pelvic pain that does not go away.  You have pressure on your bladder or trouble emptying your bladder completely.  You have pain during sex.  You have any of the following in your abdomen: ? A feeling of fullness. ? Pressure. ? Discomfort. ? Pain that does not go away. ? Swelling.  You feel generally ill.  You become constipated.  You lose your appetite.  You develop severe acne.  You start to have more body hair and facial hair.  You are gaining weight or losing weight without changing your exercise and eating habits.  You think you may be pregnant. Get help right away if:  You have abdominal pain that is severe or gets worse.  You cannot eat or drink without vomiting.  You suddenly develop a fever.  Your menstrual period is much heavier than usual. This information is not intended to replace advice given to you  by your health care provider. Make sure you discuss any questions you have with your health care provider. Document Released: 07/01/2005 Document Revised: 01/19/2016 Document Reviewed: 12/03/2015 Elsevier Interactive Patient Education  2018 [employer]. Uterine Fibroids Uterine fibroids are tissue masses (tumors) that can develop in the womb (uterus). They are also called leiomyomas. This type of tumor is not cancerous (benign) and does not spread to other parts of the body outside of the pelvic area, which is between the hip bones. Occasionally, fibroids may develop in the fallopian tubes, in the cervix, or on the support structures (ligaments) that surround the uterus. You can have one or many fibroids. Fibroids  can vary in size, weight, and where they grow in the uterus. Some can become quite large. Most fibroids do not require medical treatment. What are the causes? A fibroid can develop when a single uterine cell keeps growing (replicating). Most cells in the human body have a control mechanism that keeps them from replicating without control. What are the signs or symptoms? Symptoms may include:  Heavy bleeding during your period.  Bleeding or spotting between periods.  Pelvic pain and pressure.  Bladder problems, such as needing to urinate more often (urinary frequency) or urgently.  Inability to reproduce offspring (infertility).  Miscarriages.  How is this diagnosed? Uterine fibroids are diagnosed through a physical exam. Your health care provider may feel the lumpy tumors during a pelvic exam. Ultrasonography and an MRI may be done to determine the size, location, and number of fibroids. How is this treated? Treatment may include:  Watchful waiting. This involves getting the fibroid checked by your health care provider to see if it grows or shrinks. Follow your health care provider's recommendations for how often to have this checked.  Hormone medicines. These can be taken by mouth or given through an intrauterine device (IUD).  Surgery. ? Removing the fibroids (myomectomy) or the uterus (hysterectomy). ? Removing blood supply to the fibroids (uterine artery embolization).  If fibroids interfere with your fertility and you want to become pregnant, your health care provider may recommend having the fibroids removed. Follow these instructions at home:  Keep all follow-up visits as directed by your health care provider. This is important.  Take over-the-counter and prescription medicines only as told by your health care provider. ? If you were prescribed a hormone treatment, take the hormone medicines exactly as directed.  Ask your health care provider about taking iron pills and  increasing the amount of dark green, leafy vegetables in your diet. These actions can help to boost your blood iron levels, which may be affected by heavy menstrual bleeding.  Pay close attention to your period and tell your health care provider about any changes, such as: ? Increased blood flow that requires you to use more pads or tampons than usual per month. ? A change in the number of days that your period lasts per month. ? A change in symptoms that are associated with your period, such as abdominal cramping or back pain. Contact a health care provider if:  You have pelvic pain, back pain, or abdominal cramps that cannot be controlled with medicines.  You have an increase in bleeding between and during periods.  You soak tampons or pads in a half hour or less.  You feel lightheaded, extra tired, or weak. Get help right away if:  You faint.  You have a sudden increase in pelvic pain. This information is not intended to replace advice given to you  by your health care provider. Make sure you discuss any questions you have with your health care provider. Document Released: 06/28/2000 Document Revised: 02/29/2016 Document Reviewed: 12/28/2013 Elsevier Interactive Patient Education  [employer].

## 2018-02-02 ENCOUNTER — Telehealth: Payer: Self-pay | Admitting: Obstetrics and Gynecology

## 2018-02-03 ENCOUNTER — Other Ambulatory Visit: Payer: Self-pay | Admitting: Sports Medicine

## 2018-02-03 ENCOUNTER — Telehealth: Payer: Self-pay | Admitting: Family Medicine

## 2018-02-03 ENCOUNTER — Other Ambulatory Visit: Payer: Self-pay | Admitting: *Deleted

## 2018-02-03 MED ORDER — NORETHINDRONE 0.35 MG PO TABS: 1.0000 | ORAL_TABLET | Freq: Every day | ORAL | 0 refills | Status: DC

## 2018-02-03 NOTE — Progress Notes (Signed)
New Rx for Micronor #3pkg/0RF placed. Initial Rx sent to dispense 1 pkg.

## 2018-02-03 NOTE — Telephone Encounter (Signed)
Copied from [place] [id number]. Topic: General - Other >> Feb 03, 2018 10:48 AM Alfredia Ferguson R wrote: Pt is calling in requesting a note to go back to work next week 02/09/2018  CB# [phone]

## 2018-02-03 NOTE — Telephone Encounter (Signed)
Sent to PCP ?

## 2018-02-03 NOTE — Addendum Note (Signed)
Addended by: Burnice Logan on: 02/03/2018 03:22 PM   Modules accepted: Orders

## 2018-02-04 NOTE — Telephone Encounter (Signed)
When does pt have f/u with her Orthopedist?  Would recommend getting note from them as she just had hip replacement a month or so ago.

## 2018-02-05 NOTE — Telephone Encounter (Signed)
Spoke with pt stated that she saw the Orthopedic Dr last week, Voiced understanding that Dr Volanda Napoleon recommends for pt to get a work note from Orthopedic doctor

## 2018-02-06 ENCOUNTER — Encounter (INDEPENDENT_AMBULATORY_CARE_PROVIDER_SITE_OTHER): Payer: Self-pay

## 2018-02-06 ENCOUNTER — Telehealth (INDEPENDENT_AMBULATORY_CARE_PROVIDER_SITE_OTHER): Payer: Self-pay | Admitting: Orthopaedic Surgery

## 2018-02-06 NOTE — Telephone Encounter (Signed)
Patient aware note at front desk  

## 2018-02-06 NOTE — Telephone Encounter (Signed)
Patient called to state that she needed return to work note.  Please call patient to advise. No specifics given.   [phone]

## 2018-02-09 MED ORDER — TRIAMCINOLONE ACETONIDE 40 MG/ML IJ SUSP
80.0000 mg | INTRAMUSCULAR | Status: AC | PRN
  Administered 2018-01-28: 80 mg via INTRA_ARTICULAR

## 2018-02-09 MED ORDER — BUPIVACAINE HCL 0.5 % IJ SOLN
3.0000 mL | INTRAMUSCULAR | Status: AC | PRN
  Administered 2018-01-28: 3 mL via INTRA_ARTICULAR

## 2018-02-12 ENCOUNTER — Other Ambulatory Visit: Payer: Self-pay | Admitting: Sports Medicine

## 2018-02-12 ENCOUNTER — Encounter: Payer: Self-pay | Admitting: Sports Medicine

## 2018-02-12 ENCOUNTER — Other Ambulatory Visit (INDEPENDENT_AMBULATORY_CARE_PROVIDER_SITE_OTHER): Payer: Self-pay | Admitting: Orthopaedic Surgery

## 2018-02-12 ENCOUNTER — Other Ambulatory Visit: Payer: Self-pay

## 2018-02-12 MED ORDER — HYDROCODONE-ACETAMINOPHEN 5-325 MG PO TABS: 1.0000 | ORAL_TABLET | Freq: Four times a day (QID) | ORAL | 0 refills | Status: DC | PRN

## 2018-02-12 MED ORDER — CELECOXIB 200 MG PO CAPS: 200.0000 mg | ORAL_CAPSULE | Freq: Every day | ORAL | 2 refills | Status: DC

## 2018-02-12 NOTE — Telephone Encounter (Signed)
Rx request 

## 2018-02-19 ENCOUNTER — Other Ambulatory Visit: Payer: Self-pay | Admitting: Family Medicine

## 2018-02-27 ENCOUNTER — Other Ambulatory Visit (INDEPENDENT_AMBULATORY_CARE_PROVIDER_SITE_OTHER): Payer: Self-pay | Admitting: Orthopaedic Surgery

## 2018-02-27 MED ORDER — METHOCARBAMOL 500 MG PO TABS: 500.0000 mg | ORAL_TABLET | Freq: Four times a day (QID) | ORAL | 0 refills | Status: DC | PRN

## 2018-02-27 NOTE — Telephone Encounter (Signed)
Ok for refill? 

## 2018-03-01 ENCOUNTER — Other Ambulatory Visit (INDEPENDENT_AMBULATORY_CARE_PROVIDER_SITE_OTHER): Payer: Self-pay | Admitting: Orthopaedic Surgery

## 2018-03-01 ENCOUNTER — Other Ambulatory Visit: Payer: Self-pay | Admitting: Obstetrics and Gynecology

## 2018-03-01 NOTE — Telephone Encounter (Signed)
Rx request 

## 2018-03-02 MED ORDER — HYDROCODONE-ACETAMINOPHEN 5-325 MG PO TABS: 1.0000 | ORAL_TABLET | Freq: Four times a day (QID) | ORAL | 0 refills | Status: DC | PRN

## 2018-03-02 NOTE — Telephone Encounter (Signed)
LMOM for pharmacy of the medication and not understanding the "error" and if they needed anything from [place] to please call

## 2018-03-02 NOTE — Telephone Encounter (Signed)
Medication refill request: camila  Last AEX:  01/22/18 Dr. Quincy Simmonds  Next AEX: none Last MMG (if hormonal medication request): none Refill authorized: 02/03/18 #[id number] To [facility] [place]

## 2018-03-04 ENCOUNTER — Other Ambulatory Visit (INDEPENDENT_AMBULATORY_CARE_PROVIDER_SITE_OTHER): Payer: Self-pay | Admitting: Orthopaedic Surgery

## 2018-03-05 NOTE — Telephone Encounter (Signed)
Please advise 

## 2018-03-10 ENCOUNTER — Encounter: Payer: Self-pay | Admitting: Obstetrics and Gynecology

## 2018-03-17 ENCOUNTER — Other Ambulatory Visit (INDEPENDENT_AMBULATORY_CARE_PROVIDER_SITE_OTHER): Payer: Self-pay | Admitting: Orthopaedic Surgery

## 2018-03-17 MED ORDER — HYDROCODONE-ACETAMINOPHEN 5-325 MG PO TABS: 1.0000 | ORAL_TABLET | Freq: Four times a day (QID) | ORAL | 0 refills | Status: DC | PRN

## 2018-03-17 NOTE — Telephone Encounter (Signed)
Rx request 

## 2018-03-19 ENCOUNTER — Other Ambulatory Visit: Payer: BC Managed Care – PPO | Admitting: Obstetrics and Gynecology

## 2018-03-19 ENCOUNTER — Other Ambulatory Visit: Payer: BC Managed Care – PPO

## 2018-03-26 ENCOUNTER — Encounter: Payer: Self-pay | Admitting: Obstetrics and Gynecology

## 2018-03-26 ENCOUNTER — Ambulatory Visit (INDEPENDENT_AMBULATORY_CARE_PROVIDER_SITE_OTHER): Payer: BC Managed Care – PPO | Admitting: Obstetrics and Gynecology

## 2018-03-26 ENCOUNTER — Other Ambulatory Visit: Payer: BC Managed Care – PPO

## 2018-03-26 ENCOUNTER — Ambulatory Visit (INDEPENDENT_AMBULATORY_CARE_PROVIDER_SITE_OTHER): Payer: BC Managed Care – PPO

## 2018-03-26 VITALS — BP 128/82 | HR 76 | Resp 16 | Ht 68.0 in | Wt 250.0 lb

## 2018-03-26 DIAGNOSIS — N946 Dysmenorrhea, unspecified: Secondary | ICD-10-CM

## 2018-03-26 DIAGNOSIS — D219 Benign neoplasm of connective and other soft tissue, unspecified: Secondary | ICD-10-CM | POA: Diagnosis not present

## 2018-03-26 DIAGNOSIS — N83202 Unspecified ovarian cyst, left side: Secondary | ICD-10-CM

## 2018-03-26 DIAGNOSIS — N83201 Unspecified ovarian cyst, right side: Secondary | ICD-10-CM

## 2018-03-26 NOTE — Progress Notes (Signed)
GYNECOLOGY  VISIT   HPI: 42 y.o.   Married  [place] American  female   [place] with Patient's last menstrual period was 03/06/2018.   here for ultrasound   For recheck of ovarian cysts.   Has fibroids.   On Micronor and cycles are more regular.  Bleeding is still heavy but does not last as long.  This cycle was not as heavy this month.  Usually needs an overnight pad and a tampon and changes through at lunchtime. At home, she changes 3 - 4 times.  Wants to have no periods because they are painful.   Declines future childbearing.   Used Depo Provera 20 years ago and lost her hair.   GYNECOLOGIC HISTORY: Patient's last menstrual period was 03/06/2018. Contraception:  Micronor Menopausal hormone therapy:  none Last mammogram:  02/17/18 BIRADS 1 negative/density a Last pap smear:   01-22-18 negative, HR HPV negative         OB History    Gravida  1   Para  1   Term  0   Preterm  0   AB  0   Living  1     SAB  0   TAB  0   Ectopic  0   Multiple  0   Live Births  0              Patient Active Problem List   Diagnosis Date Noted  . Fibroids 01/22/2018  . 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 Unilateral primary osteoarthritis, left hip 11/06/2017  . Unilateral primary osteoarthritis, right hip 11/06/2017  . Lumbosacral radiculopathy at S1 09/12/2017  . Back pain 08/19/2017  . 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  . Abnormal gait 08/19/2017  . Obesity 08/19/2017  . Ventral hernia s/p laparoscopic repair with mesh 02/27/15 02/27/2015    Past Medical History:  Diagnosis Date  . Anemia   . Arthritis   . Back pain    lower back   . Complication of anesthesia    age 69 had twilight anesthesia and woke up suring surgery   . Depression   . Fibroids   . Hypertension   . Pinched nerve    in back    Past Surgical History:  Procedure Laterality Date  . benign breast cyst removed    . CHOLECYSTECTOMY    . INSERTION OF MESH N/A  02/27/2015   Procedure: INSERTION OF MESH;  Surgeon: Jackolyn Confer, MD;  Location: [facility];  Service: General;  Laterality: N/A;  . LAPAROSCOPIC ASSISTED VENTRAL HERNIA REPAIR N/A 02/27/2015   Procedure: LAPAROSCOPIC VENTRAL HERNIA REPAIR WITH MESH;  Surgeon: Jackolyn Confer, MD;  Location: [facility];  Service: General;  Laterality: N/A;  . THYROIDECTOMY, PARTIAL    . TOTAL HIP ARTHROPLASTY Right 11/28/2017   Procedure: RIGHT TOTAL HIP ARTHROPLASTY ANTERIOR APPROACH;  Surgeon: Mcarthur Rossetti, MD;  Location: [facility];  Service: Orthopedics;  Laterality: Right;    Current Outpatient Medications  Medication Sig Dispense Refill  . amLODipine-benazepril (LOTREL) 10-20 MG capsule TAKE 1 CAPSULE BY MOUTH ONCE DAILY 90 capsule 0  . celecoxib (CELEBREX) 200 MG capsule Take 1 capsule (200 mg total) by mouth daily. 30 capsule 2  . DULoxetine (CYMBALTA) 30 MG capsule TAKE 1 CAPSULE BY MOUTH TWICE DAILY 60 capsule 2  . HYDROcodone-acetaminophen (NORCO/VICODIN) 5-325 MG tablet Take 1 tablet by mouth every 6 (six) hours as needed for moderate pain. 30 tablet 0  . ibuprofen (ADVIL,MOTRIN)  800 MG tablet Take 800 mg by mouth as needed.    . methocarbamol (ROBAXIN) 500 MG tablet TAKE 1 TABLET BY MOUTH EVERY 6 HOURS AS NEEDED FOR MUSCLE SPASMS 60 tablet 0  . norethindrone (MICRONOR,CAMILA,ERRIN) 0.35 MG tablet Take 1 tablet (0.35 mg total) by mouth daily. 3 Package 0  . ferrous sulfate 325 (65 FE) MG tablet Take 1 tablet (325 mg total) by mouth daily with breakfast. (Patient not taking: Reported on 03/26/2018) 90 tablet 3   No current facility-administered medications for this visit.      ALLERGIES: Patient has no known allergies.  Family History  Problem Relation Age of Onset  . Hypertension Father   . Arthritis Father   . Clotting disorder Father   . Prostate cancer Paternal Grandfather     Social History   Socioeconomic History  . Marital status: Married    Spouse name: Not on file  . Number of  children: Not on file  . Years of education: Not on file  . Highest education level: Not on file  Occupational History  . Not on file  Social Needs  . Financial resource strain: Not on file  . Food insecurity:    Worry: Not on file    Inability: Not on file  . Transportation needs:    Medical: Not on file    Non-medical: Not on file  Tobacco Use  . Smoking status: Former Smoker    Types: Cigarettes    Last attempt to quit: 06/14/2017    Years since quitting: 0.7  . Smokeless tobacco: Never Used  Substance and Sexual Activity  . Alcohol use: Yes    Comment: occasionally  . Drug use: Yes    Types: Marijuana  . Sexual activity: Yes    Birth control/protection: None  Lifestyle  . Physical activity:    Days per week: Not on file    Minutes per session: Not on file  . Stress: Not on file  Relationships  . Social connections:    Talks on phone: Not on file    Gets together: Not on file    Attends religious service: Not on file    Active member of club or organization: Not on file    Attends meetings of clubs or organizations: Not on file    Relationship status: Not on file  . Intimate partner violence:    Fear of current or ex partner: Not on file    Emotionally abused: Not on file    Physically abused: Not on file    Forced sexual activity: Not on file  Other Topics Concern  . Not on file  Social History Narrative  . Not on file    Review of Systems  Musculoskeletal:       Swelling Joint pain  All other systems reviewed and are negative.   PHYSICAL EXAMINATION:    BP 128/82 (BP Location: Right Arm, Patient Position: Sitting, Cuff Size: Large)   Pulse 76   Resp 16   Ht 5\' 8"  (1.727 m)   Wt 250 lb (113.4 kg)   LMP 03/06/2018   BMI 38.01 kg/m     General appearance: alert, cooperative and appears stated age   ASSESSMENT  Fibroids. Not a good candidate for Mirena.  Small right ovarian cysts.  Large left ovarian cyst resolved.  Dysmenorrhea. Desire for  amenorrhea.  On POPs.   PLAN  We discussed continuation of POPs to improve menstruation.  For amenorrhea, would need to consider Depo  Provera, Depo Lupron as a bridge to other therapy, possible uterine artery embolization, [place], or hysterectomy.  Risks and benefits of treatment options discussed.  Reading materials also provided.    An After Visit Summary was printed and given to the patient.  ___40___ minutes face to face time of which over 50% was spent in counseling.

## 2018-03-26 NOTE — Patient Instructions (Signed)
Medroxyprogesterone injection [Contraceptive] What is this medicine? MEDROXYPROGESTERONE (me DROX ee proe JES te rone) contraceptive injections prevent pregnancy. They provide effective birth control for 3 months. Depo-subQ Provera 104 is also used for treating pain related to endometriosis. This medicine may be used for other purposes; ask your health care provider or pharmacist if you have questions. COMMON BRAND NAME(S): Depo-Provera, Depo-subQ Provera 104 What should I tell my health care provider before I take this medicine? They need to know if you have any of these conditions: -frequently drink alcohol -asthma -blood vessel disease or a history of a blood clot in the lungs or legs -bone disease such as osteoporosis -breast cancer -diabetes -eating disorder (anorexia nervosa or bulimia) -high blood pressure -HIV infection or AIDS -kidney disease -liver disease -mental depression -migraine -seizures (convulsions) -stroke -tobacco smoker -vaginal bleeding -an unusual or allergic reaction to medroxyprogesterone, other hormones, medicines, foods, dyes, or preservatives -pregnant or trying to get pregnant -breast-feeding How should I use this medicine? Depo-Provera Contraceptive injection is given into a muscle. Depo-subQ Provera 104 injection is given under the skin. These injections are given by a health care professional. You must not be pregnant before getting an injection. The injection is usually given during the first 5 days after the start of a menstrual period or 6 weeks after delivery of a baby. Talk to your pediatrician regarding the use of this medicine in children. Special care may be needed. These injections have been used in female children who have started having menstrual periods. Overdosage: If you think you have taken too much of this medicine contact a poison control center or emergency room at once. NOTE: This medicine is only for you. Do not share this medicine  with others. What if I miss a dose? Try not to miss a dose. You must get an injection once every 3 months to maintain birth control. If you cannot keep an appointment, call and reschedule it. If you wait longer than 13 weeks between Depo-Provera contraceptive injections or longer than 14 weeks between Depo-subQ Provera 104 injections, you could get pregnant. Use another method for birth control if you miss your appointment. You may also need a pregnancy test before receiving another injection. What may interact with this medicine? Do not take this medicine with any of the following medications: -bosentan This medicine may also interact with the following medications: -aminoglutethimide -antibiotics or medicines for infections, especially rifampin, rifabutin, rifapentine, and griseofulvin -aprepitant -barbiturate medicines such as phenobarbital or primidone -bexarotene -carbamazepine -medicines for seizures like ethotoin, felbamate, oxcarbazepine, phenytoin, topiramate -modafinil -St. John's wort This list may not describe all possible interactions. Give your health care provider a list of all the medicines, herbs, non-prescription drugs, or dietary supplements you use. Also tell them if you smoke, drink alcohol, or use illegal drugs. Some items may interact with your medicine. What should I watch for while using this medicine? This drug does not protect you against HIV infection (AIDS) or other sexually transmitted diseases. Use of this product may cause you to lose calcium from your bones. Loss of calcium may cause weak bones (osteoporosis). Only use this product for more than 2 years if other forms of birth control are not right for you. The longer you use this product for birth control the more likely you will be at risk for weak bones. Ask your health care professional how you can keep strong bones. You may have a change in bleeding pattern or irregular periods. Many females stop having    periods while taking this drug. If you have received your injections on time, your chance of being pregnant is very low. If you think you may be pregnant, see your health care professional as soon as possible. Tell your health care professional if you want to get pregnant within the next year. The effect of this medicine may last a long time after you get your last injection. What side effects may I notice from receiving this medicine? Side effects that you should report to your doctor or health care professional as soon as possible: -allergic reactions like skin rash, itching or hives, swelling of the face, lips, or tongue -breast tenderness or discharge -breathing problems -changes in vision -depression -feeling faint or lightheaded, falls -fever -pain in the abdomen, chest, groin, or leg -problems with balance, talking, walking -unusually weak or tired -yellowing of the eyes or skin Side effects that usually do not require medical attention (report to your doctor or health care professional if they continue or are bothersome): -acne -fluid retention and swelling -headache -irregular periods, spotting, or absent periods -temporary pain, itching, or skin reaction at site where injected -weight gain This list may not describe all possible side effects. Call your doctor for medical advice about side effects. You may report side effects to FDA at 1-800-FDA-1088. Where should I keep my medicine? This does not apply. The injection will be given to you by a health care professional. NOTE: This sheet is a summary. It may not cover all possible information. If you have questions about this medicine, talk to your doctor, pharmacist, or health care provider.  2018 Elsevier/Gold Standard (2008-07-22 18:37:56)   Uterine Artery Embolization for Fibroids Uterine artery embolization is a nonsurgical treatment to shrink fibroids. A thin plastic tube (catheter) is used to inject material that blocks  off the blood supply to the fibroid, which causes the fibroid to shrink. Tell a health care provider about:  Any allergies you have.  All medicines you are taking, including vitamins, herbs, eye drops, creams, and over-the-counter medicines.  Any problems you or family members have had with anesthetic medicines.  Any blood disorders you have.  Any surgeries you have had.  Any medical conditions you have. What are the risks?  Injury to the uterus from decreased blood supply  Infection.  Blood infection (septicemia).  Lack of menstrual periods (amenorrhea).  Death of tissue cells (necrosis) around your bladder or vulva.  Development of a hole between organs or from an organ to the surface of your skin (fistula).  Blood clot in the legs (deep vein thrombosis) or lung (pulmonary embolus). What happens before the procedure?  Ask your health care provider about changing or stopping your regular medicines.  Do not take aspirin or blood thinners (anticoagulants) for 1 week before the surgery or as directed by your health care provider.  Do not eat or drink anything for 8 hours before the surgery or as directed by your health care provider.  Empty your bladder before the procedure begins. What happens during the procedure?  An IV tube will be placed into one of your veins. This will be used to give you a sedative and pain medication (conscious sedation).  You will be given a medicine that numbs the area (local anesthetic).  A small cut will be made in your groin. A catheter is then inserted into the main artery of your leg.  The catheter will be guided through the artery to your uterus. A series of images will be  taken while dye is injected through the catheter in your groin. X-rays are taken at the same time. This is done to provide a road map of the blood supply to your uterus and fibroids.  Tiny plastic spheres, about the size of sand grains, will be injected through the  catheter. Metal coils may be used to help block the artery. The particles will lodge in tiny branches of the uterine artery that supplies blood to the fibroids.  The procedure is repeated on the artery that supplies the other side of the uterus.  The catheter is then removed and pressure is held to stop any bleeding. No stitches are needed.  A dressing is then placed over the cut (incision). What happens after the procedure?  You will be taken to a recovery area where your progress will be monitored until you are awake, stable, and taking fluids well. If there are no other problems, you will then be moved to a regular hospital room.  You will be observed overnight in the hospital.  You will have cramping that should be controlled with pain medication. This information is not intended to replace advice given to you by your health care provider. Make sure you discuss any questions you have with your health care provider. Document Released: 09/16/2005 Document Revised: 12/07/2015 Document Reviewed: 01/14/2013 Elsevier Interactive Patient Education  2018 [employer].  Hysterectomy Information A hysterectomy is a surgery to remove your uterus. After surgery, you will no longer have periods. Also, you will not be able to get pregnant. Reasons for this surgery  You have bleeding that is not normal and keeps coming back.  You have lasting (chronic) lower belly (pelvic) pain.  You have a lasting infection.  The lining of your uterus grows outside your uterus.  The lining of your uterus grows in the muscle of your uterus.  Your uterus falls down into your vagina.  You have a growth in your uterus that causes problems.  You have cells that could turn into cancer (precancerous cells).  You have cancer of the uterus or cervix. Types There are 3 types of hysterectomies. Depending on the type, the surgery will:  Remove the top part of the uterus only.  Remove the uterus and the  cervix.  Remove the uterus, cervix, and tissue that holds the uterus in place in the lower belly.  Ways a hysterectomy can be performed There are 5 ways this surgery can be performed.  A cut (incision) is made in the belly (abdomen). The uterus is taken out through the cut.  A cut is made in the vagina. The uterus is taken out through the cut.  Three or four cuts are made in the belly. A surgical device with a camera is put through one of the cuts. The uterus is cut into small pieces. The uterus is taken out through the cuts or the vagina.  Three or four cuts are made in the belly. A surgical device with a camera is put through one of the cuts. The uterus is taken out through the vagina.  Three or four cuts are made in the belly. A surgical device that is controlled by a computer makes a visual image. The device helps the surgeon control the surgical tools. The uterus is cut into small pieces. The pieces are taken out through the cuts or through the vagina.  What can I expect after the surgery?  You will be given pain medicine.  You will need help at home  for 3-5 days after surgery.  You will need to see your doctor in 2-4 weeks after surgery.  You may get hot flashes, have night sweats, and have trouble sleeping.  You may need to have Pap tests in the future if your surgery was related to cancer. Talk to your doctor. It is still good to have regular exams. This information is not intended to replace advice given to you by your health care provider. Make sure you discuss any questions you have with your health care provider. Document Released: 09/23/2011 Document Revised: 12/07/2015 Document Reviewed: 03/08/2013 Elsevier Interactive Patient Education  2018 [employer]. Leuprolide depot injection What is this medicine? LEUPROLIDE (loo PROE lide) is a man-made protein that acts like a natural hormone in the body. It decreases testosterone in men and decreases estrogen in women. In  men, this medicine is used to treat advanced prostate cancer. In women, some forms of this medicine may be used to treat endometriosis, uterine fibroids, or other female hormone-related problems. This medicine may be used for other purposes; ask your health care provider or pharmacist if you have questions. COMMON BRAND NAME(S): Eligard, Lupron Depot, Lupron Depot-Ped, Viadur What should I tell my health care provider before I take this medicine? They need to know if you have any of these conditions: -diabetes -heart disease or previous heart attack -high blood pressure -high cholesterol -mental illness -osteoporosis -pain or difficulty passing urine -seizures -spinal cord metastasis -stroke -suicidal thoughts, plans, or attempt; a previous suicide attempt by you or a family member -tobacco smoker -unusual vaginal bleeding (women) -an unusual or allergic reaction to leuprolide, benzyl alcohol, other medicines, foods, dyes, or preservatives -pregnant or trying to get pregnant -breast-feeding How should I use this medicine? This medicine is for injection into a muscle or for injection under the skin. It is given by a health care professional in a hospital or clinic setting. The specific product will determine how it will be given to you. Make sure you understand which product you receive and how often you will receive it. Talk to your pediatrician regarding the use of this medicine in children. Special care may be needed. Overdosage: If you think you have taken too much of this medicine contact a poison control center or emergency room at once. NOTE: This medicine is only for you. Do not share this medicine with others. What if I miss a dose? It is important not to miss a dose. Call your doctor or health care professional if you are unable to keep an appointment. Depot injections: Depot injections are given either once-monthly, every 12 weeks, every 16 weeks, or every 24 weeks depending on  the product you are prescribed. The product you are prescribed will be based on if you are female or female, and your condition. Make sure you understand your product and dosing. What may interact with this medicine? Do not take this medicine with any of the following medications: -chasteberry This medicine may also interact with the following medications: -herbal or dietary supplements, like black cohosh or DHEA -female hormones, like estrogens or progestins and birth control pills, patches, rings, or injections -female hormones, like testosterone This list may not describe all possible interactions. Give your health care provider a list of all the medicines, herbs, non-prescription drugs, or dietary supplements you use. Also tell them if you smoke, drink alcohol, or use illegal drugs. Some items may interact with your medicine. What should I watch for while using this medicine? Visit your doctor  or health care professional for regular checks on your progress. During the first weeks of treatment, your symptoms may get worse, but then will improve as you continue your treatment. You may get hot flashes, increased bone pain, increased difficulty passing urine, or an aggravation of nerve symptoms. Discuss these effects with your doctor or health care professional, some of them may improve with continued use of this medicine. Female patients may experience a menstrual cycle or spotting during the first months of therapy with this medicine. If this continues, contact your doctor or health care professional. What side effects may I notice from receiving this medicine? Side effects that you should report to your doctor or health care professional as soon as possible: -allergic reactions like skin rash, itching or hives, swelling of the face, lips, or tongue -breathing problems -chest pain -depression or memory disorders -pain in your legs or groin -pain at site where injected or  implanted -seizures -severe headache -swelling of the feet and legs -suicidal thoughts or other mood changes -visual changes -vomiting Side effects that usually do not require medical attention (report to your doctor or health care professional if they continue or are bothersome): -breast swelling or tenderness -decrease in sex drive or performance -diarrhea -hot flashes -loss of appetite -muscle, joint, or bone pains -nausea -redness or irritation at site where injected or implanted -skin problems or acne This list may not describe all possible side effects. Call your doctor for medical advice about side effects. You may report side effects to FDA at 1-800-FDA-1088. Where should I keep my medicine? This drug is given in a hospital or clinic and will not be stored at home. NOTE: This sheet is a summary. It may not cover all possible information. If you have questions about this medicine, talk to your doctor, pharmacist, or health care provider.  2018 Elsevier/Gold Standard (2015-12-14 09:45:53)

## 2018-03-31 ENCOUNTER — Other Ambulatory Visit (INDEPENDENT_AMBULATORY_CARE_PROVIDER_SITE_OTHER): Payer: Self-pay | Admitting: Orthopaedic Surgery

## 2018-03-31 MED ORDER — HYDROCODONE-ACETAMINOPHEN 5-325 MG PO TABS: 1.0000 | ORAL_TABLET | Freq: Four times a day (QID) | ORAL | 0 refills | Status: DC | PRN

## 2018-03-31 NOTE — Telephone Encounter (Signed)
Please advise 

## 2018-04-12 ENCOUNTER — Telehealth (INDEPENDENT_AMBULATORY_CARE_PROVIDER_SITE_OTHER): Payer: Self-pay | Admitting: Orthopaedic Surgery

## 2018-04-12 DIAGNOSIS — M25552 Pain in left hip: Secondary | ICD-10-CM

## 2018-04-12 NOTE — Telephone Encounter (Signed)
Rx request 

## 2018-04-13 ENCOUNTER — Other Ambulatory Visit (INDEPENDENT_AMBULATORY_CARE_PROVIDER_SITE_OTHER): Payer: Self-pay | Admitting: Orthopaedic Surgery

## 2018-04-13 ENCOUNTER — Other Ambulatory Visit (INDEPENDENT_AMBULATORY_CARE_PROVIDER_SITE_OTHER): Payer: Self-pay

## 2018-04-13 ENCOUNTER — Encounter (INDEPENDENT_AMBULATORY_CARE_PROVIDER_SITE_OTHER): Payer: Self-pay | Admitting: Orthopaedic Surgery

## 2018-04-13 MED ORDER — TRAMADOL HCL 50 MG PO TABS: 50.0000 mg | ORAL_TABLET | Freq: Three times a day (TID) | ORAL | 0 refills | Status: DC | PRN

## 2018-04-13 NOTE — Telephone Encounter (Signed)
Tried calling patient back. No answer. Left very detailed VM advising per Dr Ninfa Linden. I also put in order for left hip injection.

## 2018-04-13 NOTE — Telephone Encounter (Signed)
IC patient to advise of note below per Dr Ninfa Linden. No answer. LMVM for her to call back to discuss.

## 2018-04-13 NOTE — Addendum Note (Signed)
Addended byLaurann Montana on: 04/13/2018 10:32 AM   Modules accepted: Orders

## 2018-04-13 NOTE — Telephone Encounter (Signed)
Patient said she was calling about her left hip, the right hip is fine and not painful from the surgery, she said she is in constant pain and is wanting to see if she can get an injection. So again she wants to let Dr. Ninfa Linden know this is not post surgery related. Please advise if she can get pain rx to help with other hip or injection. # [phone]

## 2018-04-13 NOTE — Telephone Encounter (Signed)
Please advise. Thanks.  

## 2018-04-13 NOTE — Telephone Encounter (Signed)
It is okay to set her up for an intra-articular steroid injection in the left hip.  With that being said, I cannot keep her on hydrocodone or other strong pain medications to treat the pain from osteoarthritis.  This is against the narcotics laws.  I have to stop those medications now and can only provide something such as tramadol which I will send in for her.  Again at least she can set up a steroid injection for her left hip in the time being.

## 2018-04-13 NOTE — Telephone Encounter (Signed)
I has been over 4 months since her surgery.  She needs to try something less strong like tylenol #3 or tramadol at this standpoint.

## 2018-04-20 ENCOUNTER — Ambulatory Visit (INDEPENDENT_AMBULATORY_CARE_PROVIDER_SITE_OTHER): Payer: Self-pay

## 2018-04-20 ENCOUNTER — Ambulatory Visit (INDEPENDENT_AMBULATORY_CARE_PROVIDER_SITE_OTHER): Payer: BC Managed Care – PPO | Admitting: Physical Medicine and Rehabilitation

## 2018-04-20 ENCOUNTER — Encounter (INDEPENDENT_AMBULATORY_CARE_PROVIDER_SITE_OTHER): Payer: Self-pay | Admitting: Physical Medicine and Rehabilitation

## 2018-04-20 DIAGNOSIS — M25552 Pain in left hip: Secondary | ICD-10-CM

## 2018-04-20 NOTE — Progress Notes (Signed)
Rachel Warren - 42 y.o. female MRN [id number]  Date of birth: 07/08/1976  Office Visit Note: Visit Date: 04/20/2018 PCP: Billie Ruddy, MD Referred by: Billie Ruddy, MD  Subjective: Chief Complaint  Patient presents with  . Left Hip - Pain  . Left Knee - Pain   HPI: Rachel Warren is a 42 y.o. female who comes in today At the request of Dr. Jean Rosenthal for repeat left intra-articular hip injection.  Prior hip injection been very beneficial.  Last one was in July.  She is looking probably a planned surgery in the upcoming year.  She is having left hip and groin pain consistent with hip pathology.  ROS Otherwise per HPI.  Assessment & Plan: Visit Diagnoses:  1. Pain in left hip     Plan: No additional findings.   Meds & Orders: No orders of the defined types were placed in this encounter.   Orders Placed This Encounter  Procedures  . Large Joint Inj: L hip joint  . XR C-ARM NO REPORT    Follow-up: Return if symptoms worsen or fail to improve, for Dr. Ninfa Linden.   Procedures: Large Joint Inj: L hip joint on 04/20/2018 10:10 AM Indications: diagnostic evaluation and pain Details: 22 G 3.5 in needle, fluoroscopy-guided anterior approach  Arthrogram: No  Medications: 3 mL bupivacaine 0.5 %; 80 mg triamcinolone acetonide 40 MG/ML Aspirate: 5.5 mL blood-tinged Outcome: tolerated well, no immediate complications  There was excellent flow of contrast producing a partial arthrogram of the hip. The patient did have relief of symptoms during the anesthetic phase of the injection. Procedure, treatment alternatives, risks and benefits explained, specific risks discussed. Consent was given by the patient. Immediately prior to procedure a time out was called to verify the correct patient, procedure, equipment, support staff and site/side marked as required. Patient was prepped and draped in the usual sterile fashion.      No notes on file   Clinical  History: No specialty comments available.   She reports that she quit smoking about 10 months ago. Her smoking use included cigarettes. She has never used smokeless tobacco.  Recent Labs    01/14/18 0941  HGBA1C 4.8    Objective:  VS:  HT:    WT:   BMI:     BP:   HR: bpm  TEMP: ( )  RESP:  Physical Exam  Ortho Exam Imaging: No results found.  Past Medical/Family/Surgical/Social History: Medications & Allergies reviewed per EMR, new medications updated. Patient Active Problem List   Diagnosis Date Noted  . Fibroids 01/22/2018  . 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 Unilateral primary osteoarthritis, left hip 11/06/2017  . Unilateral primary osteoarthritis, right hip 11/06/2017  . Lumbosacral radiculopathy at S1 09/12/2017  . Back pain 08/19/2017  . 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  . Abnormal gait 08/19/2017  . Obesity 08/19/2017  . Ventral hernia s/p laparoscopic repair with mesh 02/27/15 02/27/2015   Past Medical History:  Diagnosis Date  . Anemia   . Arthritis   . Back pain    lower back   . Complication of anesthesia    age 20 had twilight anesthesia and woke up suring surgery   . Depression   . Fibroids   . Hypertension   . Pinched nerve    in back   Family History  Problem Relation Age of Onset  . Hypertension Father   . Arthritis Father   . Clotting disorder Father   .  Prostate cancer Paternal Grandfather    Past Surgical History:  Procedure Laterality Date  . benign breast cyst removed    . CHOLECYSTECTOMY    . INSERTION OF MESH N/A 02/27/2015   Procedure: INSERTION OF MESH;  Surgeon: Jackolyn Confer, MD;  Location: [facility];  Service: General;  Laterality: N/A;  . LAPAROSCOPIC ASSISTED VENTRAL HERNIA REPAIR N/A 02/27/2015   Procedure: LAPAROSCOPIC VENTRAL HERNIA REPAIR WITH MESH;  Surgeon: Jackolyn Confer, MD;  Location: [facility];  Service: General;  Laterality: N/A;  . THYROIDECTOMY, PARTIAL    . TOTAL HIP ARTHROPLASTY Right  11/28/2017   Procedure: RIGHT TOTAL HIP ARTHROPLASTY ANTERIOR APPROACH;  Surgeon: Mcarthur Rossetti, MD;  Location: [facility];  Service: Orthopedics;  Laterality: Right;   Social History   Occupational History  . Not on file  Tobacco Use  . Smoking status: Former Smoker    Types: Cigarettes    Last attempt to quit: 06/14/2017    Years since quitting: 0.8  . Smokeless tobacco: Never Used  Substance and Sexual Activity  . Alcohol use: Yes    Comment: occasionally  . Drug use: Yes    Types: Marijuana  . Sexual activity: Yes    Birth control/protection: None

## 2018-04-20 NOTE — Patient Instructions (Signed)

## 2018-04-20 NOTE — Progress Notes (Signed)
Pt states pain in left hip (groin pain) that radiates into left knee. Pt states pain started back mid September 2019. Pt states all activity makes pain worse, aleve helps with pain.   .Numeric Pain Rating Scale and Functional Assessment Average Pain 10   In the last MONTH (on 0-10 scale) has pain interfered with the following?  1. General activity like being  able to carry out your everyday physical activities such as walking, climbing stairs, carrying groceries, or moving a chair?  Rating(6)   -Dye Allergies.

## 2018-04-26 ENCOUNTER — Other Ambulatory Visit: Payer: Self-pay | Admitting: Family Medicine

## 2018-04-29 ENCOUNTER — Encounter (INDEPENDENT_AMBULATORY_CARE_PROVIDER_SITE_OTHER): Payer: Self-pay | Admitting: Orthopaedic Surgery

## 2018-05-01 MED ORDER — BUPIVACAINE HCL 0.5 % IJ SOLN
3.0000 mL | INTRAMUSCULAR | Status: AC | PRN
  Administered 2018-04-20: 3 mL via INTRA_ARTICULAR

## 2018-05-01 MED ORDER — TRIAMCINOLONE ACETONIDE 40 MG/ML IJ SUSP
80.0000 mg | INTRAMUSCULAR | Status: AC | PRN
  Administered 2018-04-20: 80 mg via INTRA_ARTICULAR

## 2018-05-12 ENCOUNTER — Other Ambulatory Visit: Payer: Self-pay | Admitting: Sports Medicine

## 2018-05-19 ENCOUNTER — Other Ambulatory Visit: Payer: Self-pay | Admitting: Family Medicine

## 2018-05-26 ENCOUNTER — Encounter: Payer: Self-pay | Admitting: Obstetrics and Gynecology

## 2018-05-26 NOTE — Telephone Encounter (Signed)
Spoke with patient. Vaginal bleeding and cycles as stated below. Hx of fibroids. Currently on micronor, has not missed any pills, no late pills as well.  Currently not taking iron pills as prescribed. She is encouraged to take them as directed with Vitamin C source. Has some dizziness, but states that is not new for her, she states she is anemic and feels this is from anemia. Last hgb with PCP was 12 (01/14/18) up from 8.2 (12/01/17).    Patient requests afternoon appointment only due to her work schedule. Afternoon appointment scheduled for 06/04/2018.   Discussed emergency bleeding symptoms and Advised patient to call back or seek immediate medical care if bleeding worsens or soaking through 1 pad/tampon per hour for two hours or if becomes symptomatic with sob, chest pain, fatigued, lightheaded, or weakness.  Patient verbalized understanding of emergency symptoms.   Advised will send message to Dr. Quincy Simmonds and return call if any further instructions. Pt agreeable.

## 2018-05-26 NOTE — Telephone Encounter (Signed)
Please contact patient in response to her My Chart message regarding her irregular bleeding.   I would like to have her come in for an appointment with me.

## 2018-05-28 ENCOUNTER — Other Ambulatory Visit: Payer: Self-pay | Admitting: Obstetrics and Gynecology

## 2018-05-29 NOTE — Telephone Encounter (Signed)
Medication refill request: micronor  Last AEX:  01-22-18 BS  Next OV: 06-04-18  Last MMG (if hormonal medication request): none Refill authorized: 02-03-18 3 packs, 0RF.   Patient returned call. Patient states she only has 3 pills left, so does need a refill of micronor prior to appointment on 06-04-18 with Dr. Quincy Simmonds. Pharmacy confirmed. Advised would send request to Dr. Quincy Simmonds to review and advise. Patient agreeable. Medication pended for #28, 0RF as patient has appointment on 06-04-18.

## 2018-05-29 NOTE — Telephone Encounter (Signed)
Detailed message left per DPR advising patient to return call to office to advise if she has enough of micronor to last her to appointment on 06-04-18. Advised could ask to speak to Grace.

## 2018-06-04 ENCOUNTER — Other Ambulatory Visit: Payer: Self-pay

## 2018-06-04 ENCOUNTER — Ambulatory Visit (INDEPENDENT_AMBULATORY_CARE_PROVIDER_SITE_OTHER): Payer: BC Managed Care – PPO | Admitting: Obstetrics and Gynecology

## 2018-06-04 ENCOUNTER — Encounter: Payer: Self-pay | Admitting: Obstetrics and Gynecology

## 2018-06-04 VITALS — BP 132/80 | HR 90 | Ht 68.0 in | Wt 258.0 lb

## 2018-06-04 DIAGNOSIS — D219 Benign neoplasm of connective and other soft tissue, unspecified: Secondary | ICD-10-CM

## 2018-06-04 DIAGNOSIS — N921 Excessive and frequent menstruation with irregular cycle: Secondary | ICD-10-CM

## 2018-06-04 NOTE — Patient Instructions (Signed)
Leuprolide depot injection What is this medicine? LEUPROLIDE (loo PROE lide) is a man-made protein that acts like a natural hormone in the body. It decreases testosterone in men and decreases estrogen in women. In men, this medicine is used to treat advanced prostate cancer. In women, some forms of this medicine may be used to treat endometriosis, uterine fibroids, or other female hormone-related problems. This medicine may be used for other purposes; ask your health care provider or pharmacist if you have questions. COMMON BRAND NAME(S): Eligard, Lupron Depot, Lupron Depot-Ped, Viadur What should I tell my health care provider before I take this medicine? They need to know if you have any of these conditions: -diabetes -heart disease or previous heart attack -high blood pressure -high cholesterol -mental illness -osteoporosis -pain or difficulty passing urine -seizures -spinal cord metastasis -stroke -suicidal thoughts, plans, or attempt; a previous suicide attempt by you or a family member -tobacco smoker -unusual vaginal bleeding (women) -an unusual or allergic reaction to leuprolide, benzyl alcohol, other medicines, foods, dyes, or preservatives -pregnant or trying to get pregnant -breast-feeding How should I use this medicine? This medicine is for injection into a muscle or for injection under the skin. It is given by a health care professional in a hospital or clinic setting. The specific product will determine how it will be given to you. Make sure you understand which product you receive and how often you will receive it. Talk to your pediatrician regarding the use of this medicine in children. Special care may be needed. Overdosage: If you think you have taken too much of this medicine contact a poison control center or emergency room at once. NOTE: This medicine is only for you. Do not share this medicine with others. What if I miss a dose? It is important not to miss a dose.  Call your doctor or health care professional if you are unable to keep an appointment. Depot injections: Depot injections are given either once-monthly, every 12 weeks, every 16 weeks, or every 24 weeks depending on the product you are prescribed. The product you are prescribed will be based on if you are female or female, and your condition. Make sure you understand your product and dosing. What may interact with this medicine? Do not take this medicine with any of the following medications: -chasteberry This medicine may also interact with the following medications: -herbal or dietary supplements, like black cohosh or DHEA -female hormones, like estrogens or progestins and birth control pills, patches, rings, or injections -female hormones, like testosterone This list may not describe all possible interactions. Give your health care provider a list of all the medicines, herbs, non-prescription drugs, or dietary supplements you use. Also tell them if you smoke, drink alcohol, or use illegal drugs. Some items may interact with your medicine. What should I watch for while using this medicine? Visit your doctor or health care professional for regular checks on your progress. During the first weeks of treatment, your symptoms may get worse, but then will improve as you continue your treatment. You may get hot flashes, increased bone pain, increased difficulty passing urine, or an aggravation of nerve symptoms. Discuss these effects with your doctor or health care professional, some of them may improve with continued use of this medicine. Female patients may experience a menstrual cycle or spotting during the first months of therapy with this medicine. If this continues, contact your doctor or health care professional. What side effects may I notice from receiving this medicine? Side   effects that you should report to your doctor or health care professional as soon as possible: -allergic reactions like skin  rash, itching or hives, swelling of the face, lips, or tongue -breathing problems -chest pain -depression or memory disorders -pain in your legs or groin -pain at site where injected or implanted -seizures -severe headache -swelling of the feet and legs -suicidal thoughts or other mood changes -visual changes -vomiting Side effects that usually do not require medical attention (report to your doctor or health care professional if they continue or are bothersome): -breast swelling or tenderness -decrease in sex drive or performance -diarrhea -hot flashes -loss of appetite -muscle, joint, or bone pains -nausea -redness or irritation at site where injected or implanted -skin problems or acne This list may not describe all possible side effects. Call your doctor for medical advice about side effects. You may report side effects to FDA at 1-800-FDA-1088. Where should I keep my medicine? This drug is given in a hospital or clinic and will not be stored at home. NOTE: This sheet is a summary. It may not cover all possible information. If you have questions about this medicine, talk to your doctor, pharmacist, or health care provider.  2018 Elsevier/Gold Standard (2015-12-14 09:45:53)  

## 2018-06-04 NOTE — Progress Notes (Signed)
GYNECOLOGY  VISIT   HPI: 42 y.o.   Married  [place] American  female   [place] with Patient's last menstrual period was 05/23/2018 (exact date).   here for heavy/irregular menses.   On Micronor, and cycles are still heavy but more so in the last 2 months.   Really wants her menses to stop. Sept menses - 9/16 - 9/22,  October menses - 10/14 - 10/25, November menses - 11/9 - 11/16. Pad changes (always overnight) every 2 - 3 hours.   Cramping but not as severe.   Did not like Depo Provera in past.   Had hair loss.  Left hip aching with her menses.   Had benign secretory endometrial biopsy.  US showing fibroids, with some distortion to the endometrial cavity.   Leaning toward hysterectomy.  Declines future childbearing.   Is having left hip surgery. She is uncertain about the date.   GYNECOLOGIC HISTORY: Patient's last menstrual period was 05/23/2018 (exact date). Contraception:  Micronor Menopausal hormone therapy:  none Last mammogram:   02/17/18 BIRADS 1 negative/density a Last pap smear:   01-22-18 negative, HR HPV negative                               02-08-13 Neg OB History    Gravida  1   Para  1   Term  0   Preterm  0   AB  0   Living  1     SAB  0   TAB  0   Ectopic  0   Multiple  0   Live Births  0              Patient Active Problem List   Diagnosis Date Noted  . Fibroids 01/22/2018  . 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 Unilateral primary osteoarthritis, left hip 11/06/2017  . Unilateral primary osteoarthritis, right hip 11/06/2017  . Lumbosacral radiculopathy at S1 09/12/2017  . Back pain 08/19/2017  . 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  . Abnormal gait 08/19/2017  . Obesity 08/19/2017  . Ventral hernia s/p laparoscopic repair with mesh 02/27/15 02/27/2015    Past Medical History:  Diagnosis Date  . Anemia   . Arthritis   . Back pain    lower back   . Complication of anesthesia    age 61 had twilight  anesthesia and woke up suring surgery   . Depression   . Fibroids   . Hypertension   . Pinched nerve    in back    Past Surgical History:  Procedure Laterality Date  . benign breast cyst removed    . CHOLECYSTECTOMY    . INSERTION OF MESH N/A 02/27/2015   Procedure: INSERTION OF MESH;  Surgeon: Jackolyn Confer, MD;  Location: [facility];  Service: General;  Laterality: N/A;  . LAPAROSCOPIC ASSISTED VENTRAL HERNIA REPAIR N/A 02/27/2015   Procedure: LAPAROSCOPIC VENTRAL HERNIA REPAIR WITH MESH;  Surgeon: Jackolyn Confer, MD;  Location: [facility];  Service: General;  Laterality: N/A;  . THYROIDECTOMY, PARTIAL    . TOTAL HIP ARTHROPLASTY Right 11/28/2017   Procedure: RIGHT TOTAL HIP ARTHROPLASTY ANTERIOR APPROACH;  Surgeon: Mcarthur Rossetti, MD;  Location: [facility];  Service: Orthopedics;  Laterality: Right;    Current Outpatient Medications  Medication Sig Dispense Refill  . amLODipine-benazepril (LOTREL) 10-20 MG capsule TAKE 1 CAPSULE BY MOUTH ONCE DAILY 90 capsule 0  . celecoxib (CELEBREX) 200  MG capsule TAKE 1 CAPSULE BY MOUTH ONCE DAILY 30 capsule 2  . DULoxetine (CYMBALTA) 30 MG capsule TAKE 1 CAPSULE BY MOUTH TWICE DAILY 60 capsule 2  . ferrous sulfate 325 (65 FE) MG tablet Take 1 tablet (325 mg total) by mouth daily with breakfast. 90 tablet 3  . ibuprofen (ADVIL,MOTRIN) 800 MG tablet Take 800 mg by mouth as needed.    . methocarbamol (ROBAXIN) 500 MG tablet TAKE 1 TABLET BY MOUTH EVERY 6 HOURS AS NEEDED FOR MUSCLE SPASMS 60 tablet 0  . norethindrone (MICRONOR,CAMILA,ERRIN) 0.35 MG tablet TAKE 1 TABLET BY MOUTH DAILY 28 tablet 0  . traMADol (ULTRAM) 50 MG tablet Take 1-2 tablets (50-100 mg total) by mouth 3 (three) times daily as needed. 30 tablet 0   No current facility-administered medications for this visit.      ALLERGIES: Patient has no known allergies.  Family History  Problem Relation Age of Onset  . Hypertension Father   . Arthritis Father   . Clotting disorder Father    . Prostate cancer Paternal Grandfather     Social History   Socioeconomic History  . Marital status: Married    Spouse name: Not on file  . Number of children: Not on file  . Years of education: Not on file  . Highest education level: Not on file  Occupational History  . Not on file  Social Needs  . Financial resource strain: Not on file  . Food insecurity:    Worry: Not on file    Inability: Not on file  . Transportation needs:    Medical: Not on file    Non-medical: Not on file  Tobacco Use  . Smoking status: Former Smoker    Types: Cigarettes    Last attempt to quit: 06/14/2017    Years since quitting: 0.9  . Smokeless tobacco: Never Used  Substance and Sexual Activity  . Alcohol use: Yes    Comment: occasionally  . Drug use: Yes    Types: Marijuana  . Sexual activity: Yes    Birth control/protection: None  Lifestyle  . Physical activity:    Days per week: Not on file    Minutes per session: Not on file  . Stress: Not on file  Relationships  . Social connections:    Talks on phone: Not on file    Gets together: Not on file    Attends religious service: Not on file    Active member of club or organization: Not on file    Attends meetings of clubs or organizations: Not on file    Relationship status: Not on file  . Intimate partner violence:    Fear of current or ex partner: Not on file    Emotionally abused: Not on file    Physically abused: Not on file    Forced sexual activity: Not on file  Other Topics Concern  . Not on file  Social History Narrative  . Not on file    Review of Systems  Musculoskeletal:       Muscle and joint aches--has had one hip replacement and needs another one.  All other systems reviewed and are negative.   PHYSICAL EXAMINATION:    BP 132/80 (BP Location: Right Arm, Patient Position: Sitting, Cuff Size: Large)   Pulse 90   Ht 5\' 8"  (1.727 m)   Wt 258 lb (117 kg)   LMP 05/23/2018 (Exact Date)   BMI 39.23 kg/m      General appearance: alert,  cooperative and appears stated age  Pelvic: External genitalia:  no lesions              Urethra:  normal appearing urethra with no masses, tenderness or lesions              Bartholins and Skenes: normal                 Vagina: normal appearing vagina with normal color and discharge, no lesions              Cervix: no lesions                Bimanual Exam:  Uterus:   11 week size and round uterus.               Adnexa: no mass, fullness, tenderness    Chaperone was present for exam.  ASSESSMENT  Fibroids. Not a good candidate for Mirena due to endometrial distortion.   Small right ovarian cysts.  Large left ovarian cyst resolved.  Dysmenorrhea. Desire for amenorrhea.  On POPs.   PLAN  CBC. Will continue Micronor and precert Depo Lupron.  We reviewed side effects of Depo Lupron.   She will need to check with her orthopedic surgeon regarding Depo Lupron and her upcoming surgery. She is considering hysterectomy for Summer 2020.  We discussed potential laparoscopic hysterectomy with bilateral salpingectomy.    An After Visit Summary was printed and given to the patient.  __25____ minutes face to face time of which over 50% was spent in counseling.

## 2018-06-05 LAB — CBC
HEMATOCRIT: 35.3 % (ref 34.0–46.6)
Hemoglobin: 10.9 g/dL — ABNORMAL LOW (ref 11.1–15.9)
MCH: 25.1 pg — ABNORMAL LOW (ref 26.6–33.0)
MCHC: 30.9 g/dL — AB (ref 31.5–35.7)
MCV: 81 fL (ref 79–97)
Platelets: 323 10*3/uL (ref 150–450)
RBC: 4.35 x10E6/uL (ref 3.77–5.28)
RDW: 14.7 % (ref 12.3–15.4)
WBC: 7.2 10*3/uL (ref 3.4–10.8)

## 2018-06-10 ENCOUNTER — Encounter (INDEPENDENT_AMBULATORY_CARE_PROVIDER_SITE_OTHER): Payer: Self-pay | Admitting: Orthopaedic Surgery

## 2018-06-16 ENCOUNTER — Other Ambulatory Visit: Payer: Self-pay | Admitting: Emergency Medicine

## 2018-06-16 DIAGNOSIS — D219 Benign neoplasm of connective and other soft tissue, unspecified: Secondary | ICD-10-CM

## 2018-06-16 MED ORDER — LEUPROLIDE ACETATE (3 MONTH) 11.25 MG IM KIT: 11.2500 mg | PACK | INTRAMUSCULAR | 1 refills | Status: DC

## 2018-06-16 NOTE — Progress Notes (Unsigned)
Prior authorization for Lupron approved by [facility] through 09/12/18.   Fax Printed Rx to [phone]

## 2018-06-18 ENCOUNTER — Telehealth: Payer: Self-pay | Admitting: *Deleted

## 2018-06-18 NOTE — Telephone Encounter (Signed)
Call to patient. Advised Lupron RX has been processed and prior auth approved by [employer]. See scanned documents. Advised she should expect call from [place] regarding benefits and shipping. States menses started today. No contraception, not sexually active.  Advised to call once she has approved for shipment to schedule injection.

## 2018-06-23 ENCOUNTER — Telehealth: Payer: Self-pay | Admitting: Obstetrics and Gynecology

## 2018-06-23 NOTE — Telephone Encounter (Signed)
Left detailed message, ok per dpr, instructed patient to call [facility] at [phone] to complete new patient enrollment, review benefits and authorize shipment of Lupron to [facility]. Return call to office if any additional questions.

## 2018-06-23 NOTE — Telephone Encounter (Signed)
Sam from [place] calling to verify lupron delivery was made for patient. States that if it has not been delivered, to contact [facility] at [phone].

## 2018-06-23 NOTE — Telephone Encounter (Signed)
Lupron not received in office to date.  Call placed to [facility] at [phone], spoke with Serrita.   Confirmed Lupron RX on file. Awaiting return call from patient to complete new patient enrollment and authorize shipment of medication to [facility]. Copay $0. Patient needs to return call to [phone].

## 2018-06-24 NOTE — Telephone Encounter (Signed)
Rachel Warren [facility] Triage Pool        [facility] called to notify the delivery of Lupron on Friday, 06/26/18. They stated that a welcome kit will be included and to please give that to the patient.

## 2018-06-24 NOTE — Telephone Encounter (Signed)
Patient calling to let us know she spoke with [facility] enrollment at [facility] and the Lupron is being shipped to office today.

## 2018-06-24 NOTE — Telephone Encounter (Signed)
See phone note dated 06-23-18 for additional information regarding Lupron.   Encounter closed.

## 2018-06-24 NOTE — Telephone Encounter (Signed)
Dr. Quincy Simmonds - please advise on when Lupron should be scheduled. Due for delivery to office on 12/13.   LMP 06/18/18 No Contraceptive Not SA

## 2018-06-25 NOTE — Telephone Encounter (Signed)
Please assess when patient was last sexually active.  If there is any chance of pregnancy this month, needs to wait until next menstruation.

## 2018-06-25 NOTE — Telephone Encounter (Signed)
Spoke with patient. LMP 06/18/18. Patient states she has not been SA since Sept/Oct 2019.   Nurse visit scheduled for 07/02/18 at 3:30pm. Advised patient to continue to abstain from intercourse, if any chance of pregnancy return call to office. Patient verbalizes understanding.  Routing to provider for final review. Patient is agreeable to disposition. Will close encounter.

## 2018-06-26 ENCOUNTER — Encounter: Payer: Self-pay | Admitting: Obstetrics and Gynecology

## 2018-07-02 ENCOUNTER — Ambulatory Visit: Payer: BC Managed Care – PPO

## 2018-07-02 ENCOUNTER — Telehealth: Payer: Self-pay | Admitting: *Deleted

## 2018-07-02 NOTE — Progress Notes (Unsigned)
42 y.o. Married [place] American female here for her first Depo Lupron injection.  Indication for injection:  Prolonged menstruation  LMP:  ***  Contraception:  Micronor  Lupron order:  11.25mg  IM x 3dosages.    Order to administer given by Dr. Quincy Simmonds on 06/16/18.

## 2018-07-02 NOTE — Telephone Encounter (Signed)
Patient arrived in office for Lupron injection. Lupron injection not received in office. Call placed to [place] at [phone] while patient present. Spoke with [place], advised lupron not delivered to [facility], confirmed address on file.  Was advised escalation department will f/u and return call.   Advised patient our office will return call once [facility] returns call to provide update. Patient verbalizes understanding and is agreeable to plan.

## 2018-07-03 ENCOUNTER — Ambulatory Visit (INDEPENDENT_AMBULATORY_CARE_PROVIDER_SITE_OTHER): Payer: BC Managed Care – PPO

## 2018-07-03 VITALS — BP 132/70 | HR 80 | Resp 16 | Ht 68.0 in | Wt 254.0 lb

## 2018-07-03 DIAGNOSIS — N921 Excessive and frequent menstruation with irregular cycle: Secondary | ICD-10-CM

## 2018-07-03 DIAGNOSIS — Z01812 Encounter for preprocedural laboratory examination: Secondary | ICD-10-CM

## 2018-07-03 LAB — POCT URINE PREGNANCY: Preg Test, Ur: NEGATIVE

## 2018-07-03 MED ORDER — LEUPROLIDE ACETATE (3 MONTH) 11.25 MG IM KIT
11.2500 mg | PACK | Freq: Once | INTRAMUSCULAR | Status: AC
  Administered 2018-07-03: 11.25 mg via INTRAMUSCULAR

## 2018-07-03 NOTE — Telephone Encounter (Signed)
Patient called and schedule a nurse visit for today at 3:30pm for a lupron injection.

## 2018-07-03 NOTE — Progress Notes (Signed)
42 y.o. Married [place] American female here for her first Depo Lupron injection.  Indication for injection:  Prolonged Menstruation  LMP:  06/17/18  Contraception:  none   UPT: Negative  Lupron order:  11.25mg  IM x 1dosages.    Order to administer given by Dr. Quincy Simmonds on December.

## 2018-07-03 NOTE — Telephone Encounter (Signed)
Routing to Dr. Antony Blackbird.   Encounter closed.

## 2018-07-03 NOTE — Telephone Encounter (Signed)
Left detailed message, ok per dpr. Advised lupron injection received in office, please return call to office to schedule nurse visit.

## 2018-07-10 ENCOUNTER — Telehealth: Payer: Self-pay | Admitting: *Deleted

## 2018-07-10 DIAGNOSIS — D219 Benign neoplasm of connective and other soft tissue, unspecified: Secondary | ICD-10-CM

## 2018-07-10 NOTE — Telephone Encounter (Signed)
Call to patient. Advised Dr Quincy Simmonds recommends pelvic ultrasound and consult for evaluation of  Lupron benefits on uterine size.  To schedule 10 weeks into therapy. Lupron 11.25 mg given on 07-03-18, Ultrasound scheduled for 09-10-18 at 0800.  Routing to Dr Quincy Simmonds. Encounter closed.

## 2018-07-13 ENCOUNTER — Ambulatory Visit (INDEPENDENT_AMBULATORY_CARE_PROVIDER_SITE_OTHER): Payer: BC Managed Care – PPO | Admitting: Orthopaedic Surgery

## 2018-07-13 ENCOUNTER — Ambulatory Visit (INDEPENDENT_AMBULATORY_CARE_PROVIDER_SITE_OTHER): Payer: Self-pay

## 2018-07-13 ENCOUNTER — Encounter (INDEPENDENT_AMBULATORY_CARE_PROVIDER_SITE_OTHER): Payer: Self-pay | Admitting: Orthopaedic Surgery

## 2018-07-13 DIAGNOSIS — M1612 Unilateral primary osteoarthritis, left hip: Secondary | ICD-10-CM | POA: Diagnosis not present

## 2018-07-13 DIAGNOSIS — Z96641 Presence of right artificial hip joint: Secondary | ICD-10-CM

## 2018-07-13 NOTE — Progress Notes (Signed)
Office Visit Note   Patient: Rachel Warren           Date of Birth: May 28, 1976           MRN: [id number] Visit Date: 07/13/2018              Requested by: Billie Ruddy, MD [place], [place] [id number] PCP: Billie Ruddy, MD   Assessment & Plan: Visit Diagnoses:  1. History of total hip replacement, right   2. Unilateral primary osteoarthritis, left hip     Plan: We went over x-rays in detail and she understands I am absolutely concerned about her left hip and how rapidly her disease is gotten worse in that hip.  I do feel that she would be a fall risk and that is going to be hard for her to wait until May of this next year to have her left hip replaced.  She agrees due to the detrimental effect that her left hip pain is now having on her mobility, her quality of life and her activities daily living.  Her pain is 10 out of 10 and is gotten significantly worse.  She does want [place] to have this scheduled soon and I agree with this.  This is certainly worse.  I agree with getting her surgery set up as well.  Having had this done before she is fully aware of the risk and benefits involved.  She understands the intraoperative and postoperative course as well.  Follow-Up Instructions: Return for 2 weeks post-op.   Orders:  Orders Placed This Encounter  Procedures  . XR Pelvis 1-2 Views   No orders of the defined types were placed in this encounter.     Procedures: No procedures performed   Clinical Data: No additional findings.   Subjective: Chief Complaint  Patient presents with  . Right Hip - Follow-up  The patient is very well-known to me.  She is 7 months status post a right total hip arthroplasty.  Her left hip was significantly diseased at that time but was not hurting her enough to the point that she wanted to wait till May 2020 to have that left hip replaced.  She is using a cane and she is work on activity modification however her left hip  pain is gotten rapidly worse.  She says her right hip is doing great.  HPI  Review of Systems She currently denies any headache, chest pain, shortness of breath, fever, chills, nausea, vomiting.  Objective: Vital Signs: LMP 06/17/2018   Physical Exam She is alert and oriented x3 and in no acute distress Ortho Exam Examination of the right hip shows that moves fluidly with no pain at all.  Examination of the left hip shows severe pain with any attempts of motion of the hip. Specialty Comments:  No specialty comments available.  Imaging: Xr Pelvis 1-2 Views  Result Date: 07/13/2018 An AP pelvis and a lateral of the right hip shows a total hip arthroplasty on the right side with no complicating features.  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  This is significantly worse when compared to x-rays from last year.    PMFS History: Patient Active Problem List   Diagnosis Date Noted  . Fibroids 01/22/2018  . 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 Unilateral primary osteoarthritis, left hip 11/06/2017  . Unilateral primary osteoarthritis, right hip 11/06/2017  .  Lumbosacral radiculopathy at S1 09/12/2017  . Back pain 08/19/2017  . 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  . Abnormal gait 08/19/2017  . Obesity 08/19/2017  . Ventral hernia s/p laparoscopic repair with mesh 02/27/15 02/27/2015   Past Medical History:  Diagnosis Date  . Anemia   . Arthritis   . Back pain    lower back   . Complication of anesthesia    age 62 had twilight anesthesia and woke up suring surgery   . Depression   . Fibroids   . Hypertension   . Pinched nerve    in back    Family History  Problem Relation Age of Onset  . Hypertension Father   . Arthritis Father   . Clotting disorder Father   . Prostate cancer Paternal Grandfather     Past Surgical History:  Procedure Laterality Date  . benign  breast cyst removed    . CHOLECYSTECTOMY    . INSERTION OF MESH N/A 02/27/2015   Procedure: INSERTION OF MESH;  Surgeon: Jackolyn Confer, MD;  Location: [facility];  Service: General;  Laterality: N/A;  . LAPAROSCOPIC ASSISTED VENTRAL HERNIA REPAIR N/A 02/27/2015   Procedure: LAPAROSCOPIC VENTRAL HERNIA REPAIR WITH MESH;  Surgeon: Jackolyn Confer, MD;  Location: [facility];  Service: General;  Laterality: N/A;  . THYROIDECTOMY, PARTIAL    . TOTAL HIP ARTHROPLASTY Right 11/28/2017   Procedure: RIGHT TOTAL HIP ARTHROPLASTY ANTERIOR APPROACH;  Surgeon: Mcarthur Rossetti, MD;  Location: [facility];  Service: Orthopedics;  Laterality: Right;   Social History   Occupational History  . Not on file  Tobacco Use  . Smoking status: Former Smoker    Types: Cigarettes    Last attempt to quit: 06/14/2017    Years since quitting: 1.0  . Smokeless tobacco: Never Used  Substance and Sexual Activity  . Alcohol use: Yes    Comment: occasionally  . Drug use: Yes    Types: Marijuana  . Sexual activity: Yes    Birth control/protection: None

## 2018-07-15 DIAGNOSIS — Z5189 Encounter for other specified aftercare: Secondary | ICD-10-CM

## 2018-07-15 HISTORY — DX: Encounter for other specified aftercare: Z51.89

## 2018-07-23 ENCOUNTER — Other Ambulatory Visit (INDEPENDENT_AMBULATORY_CARE_PROVIDER_SITE_OTHER): Payer: Self-pay | Admitting: Physician Assistant

## 2018-07-24 ENCOUNTER — Encounter (HOSPITAL_COMMUNITY): Payer: Self-pay

## 2018-07-24 NOTE — Pre-Procedure Instructions (Signed)
EKG 11/20/2017 IN EPIC

## 2018-07-24 NOTE — Patient Instructions (Signed)
Your procedure is scheduled on: Friday, Jan. 17, 2020   Surgery Time:  12:15PM-1:15PM   Report to [facility]  Entrance    Report to admitting at 9:45 AM   Call this number if you have problems the morning of surgery [phone]   Do not eat food or drink liquids :After Midnight.   Brush your teeth the morning of surgery.   Do NOT smoke after Midnight   Take these medicines the morning of surgery with A SIP OF WATER: Duloxetine                               You may not have any metal on your body including hair pins, jewelry, and body piercings             Do not wear make-up, lotions, powders, perfumes/cologne, or deodorant             Do not wear nail polish.  Do not shave  48 hours prior to surgery.                Do not bring valuables to the hospital. [place].   Contacts, dentures or bridgework may not be worn into surgery.   Leave suitcase in the car. After surgery it may be brought to your room.    Special Instructions: Bring a copy of your healthcare power of attorney and living will documents         the day of surgery if you haven't scanned them in before.              Please read over the following fact sheets you were given:  [facility] - Preparing for Surgery Before surgery, you can play an important role.  Because skin is not sterile, your skin needs to be as free of germs as possible.  You can reduce the number of germs on your skin by washing with CHG (chlorahexidine gluconate) soap before surgery.  CHG is an antiseptic cleaner which kills germs and bonds with the skin to continue killing germs even after washing. Please DO NOT use if you have an allergy to CHG or antibacterial soaps.  If your skin becomes reddened/irritated stop using the CHG and inform your nurse when you arrive at Short Stay. Do not shave (including legs and underarms) for at least 48 hours prior to the first CHG shower.   You may shave your face/neck.  Please follow these instructions carefully:  1.  Shower with CHG Soap the night before surgery and the  morning of surgery.  2.  If you choose to wash your hair, wash your hair first as usual with your normal  shampoo.  3.  After you shampoo, rinse your hair and body thoroughly to remove the shampoo.                             4.  Use CHG as you would any other liquid soap.  You can apply chg directly to the skin and wash.  Gently with a scrungie or clean washcloth.  5.  Apply the CHG Soap to your body ONLY FROM THE NECK DOWN.   Do   not use on face/ open  Wound or open sores. Avoid contact with eyes, ears mouth and   genitals (private parts).                       Wash face,  Genitals (private parts) with your normal soap.             6.  Wash thoroughly, paying special attention to the area where your    surgery  will be performed.  7.  Thoroughly rinse your body with warm water from the neck down.  8.  DO NOT shower/wash with your normal soap after using and rinsing off the CHG Soap.                9.  Pat yourself dry with a clean towel.            10.  Wear clean pajamas.            11.  Place clean sheets on your bed the night of your first shower and do not  sleep with pets. Day of Surgery : Do not apply any lotions/deodorants the morning of surgery.  Please wear clean clothes to the hospital/surgery center.  FAILURE TO FOLLOW THESE INSTRUCTIONS MAY RESULT IN THE CANCELLATION OF YOUR SURGERY  PATIENT SIGNATURE_________________________________  NURSE SIGNATURE__________________________________  ________________________________________________________________________   Adam Phenix  An incentive spirometer is a tool that can help keep your lungs clear and active. This tool measures how well you are filling your lungs with each breath. Taking long deep breaths may help reverse or decrease the chance of developing  breathing (pulmonary) problems (especially infection) following:  A long period of time when you are unable to move or be active. BEFORE THE PROCEDURE   If the spirometer includes an indicator to show your best effort, your nurse or respiratory therapist will set it to a desired goal.  If possible, sit up straight or lean slightly forward. Try not to slouch.  Hold the incentive spirometer in an upright position. INSTRUCTIONS FOR USE  1. Sit on the edge of your bed if possible, or sit up as far as you can in bed or on a chair. 2. Hold the incentive spirometer in an upright position. 3. Breathe out normally. 4. Place the mouthpiece in your mouth and seal your lips tightly around it. 5. Breathe in slowly and as deeply as possible, raising the piston or the ball toward the top of the column. 6. Hold your breath for 3-5 seconds or for as long as possible. Allow the piston or ball to fall to the bottom of the column. 7. Remove the mouthpiece from your mouth and breathe out normally. 8. Rest for a few seconds and repeat Steps 1 through 7 at least 10 times every 1-2 hours when you are awake. Take your time and take a few normal breaths between deep breaths. 9. The spirometer may include an indicator to show your best effort. Use the indicator as a goal to work toward during each repetition. 10. After each set of 10 deep breaths, practice coughing to be sure your lungs are clear. If you have an incision (the cut made at the time of surgery), support your incision when coughing by placing a pillow or rolled up towels firmly against it. Once you are able to get out of bed, walk around indoors and cough well. You may stop using the incentive spirometer when instructed by your caregiver.  RISKS AND COMPLICATIONS  Take your time  so you do not get dizzy or light-headed.  If you are in pain, you may need to take or ask for pain medication before doing incentive spirometry. It is harder to take a deep  breath if you are having pain. AFTER USE  Rest and breathe slowly and easily.  It can be helpful to keep track of a log of your progress. Your caregiver can provide you with a simple table to help with this. If you are using the spirometer at home, follow these instructions: [place] IF:   You are having difficultly using the spirometer.  You have trouble using the spirometer as often as instructed.  Your pain medication is not giving enough relief while using the spirometer.  You develop fever of 100.5 F (38.1 C) or higher. SEEK IMMEDIATE MEDICAL CARE IF:   You cough up bloody sputum that had not been present before.  You develop fever of 102 F (38.9 C) or greater.  You develop worsening pain at or near the incision site. MAKE SURE YOU:   Understand these instructions.  Will watch your condition.  Will get help right away if you are not doing well or get worse. Document Released: 11/11/2006 Document Revised: 09/23/2011 Document Reviewed: 01/12/2007 [facility] Patient Information 2014 [place], [place].   ________________________________________________________________________

## 2018-07-27 ENCOUNTER — Encounter (HOSPITAL_COMMUNITY)
Admission: RE | Admit: 2018-07-27 | Discharge: 2018-07-27 | Disposition: A | Discharge status: Home / Self Care | Payer: BC Managed Care – PPO | Source: Ambulatory Visit | Attending: Orthopaedic Surgery | Admitting: Orthopaedic Surgery

## 2018-07-27 ENCOUNTER — Ambulatory Visit (HOSPITAL_COMMUNITY)
Admission: RE | Admit: 2018-07-27 | Discharge: 2018-07-27 | Disposition: A | Discharge status: Home / Self Care | Payer: BC Managed Care – PPO | Source: Ambulatory Visit | Attending: Anesthesiology | Admitting: Anesthesiology

## 2018-07-27 ENCOUNTER — Other Ambulatory Visit: Payer: Self-pay

## 2018-07-27 ENCOUNTER — Encounter (HOSPITAL_COMMUNITY): Payer: Self-pay

## 2018-07-27 DIAGNOSIS — Z01818 Encounter for other preprocedural examination: Secondary | ICD-10-CM | POA: Diagnosis not present

## 2018-07-27 HISTORY — DX: Personal history of other diseases of the digestive system: Z87.19

## 2018-07-27 HISTORY — DX: Fatty (change of) liver, not elsewhere classified: K76.0

## 2018-07-27 HISTORY — DX: Personal history of other endocrine, nutritional and metabolic disease: Z86.39

## 2018-07-27 HISTORY — DX: Obesity, unspecified: E66.9

## 2018-07-27 HISTORY — DX: Disease of pancreas, unspecified: K86.9

## 2018-07-27 HISTORY — DX: Excessive and frequent menstruation with regular cycle: N92.0

## 2018-07-27 HISTORY — DX: Irregular menstruation, unspecified: N92.6

## 2018-07-27 HISTORY — DX: Ventral hernia without obstruction or gangrene: K43.9

## 2018-07-27 LAB — CBC
HCT: 41 % (ref 36.0–46.0)
Hemoglobin: 12.6 g/dL (ref 12.0–15.0)
MCH: 26 pg (ref 26.0–34.0)
MCHC: 30.7 g/dL (ref 30.0–36.0)
MCV: 84.7 fL (ref 80.0–100.0)
NRBC: 0 % (ref 0.0–0.2)
Platelets: 279 10*3/uL (ref 150–400)
RBC: 4.84 MIL/uL (ref 3.87–5.11)
RDW: 16.3 % — ABNORMAL HIGH (ref 11.5–15.5)
WBC: 6.8 10*3/uL (ref 4.0–10.5)

## 2018-07-27 LAB — BASIC METABOLIC PANEL
Anion gap: 9 (ref 5–15)
BUN: 18 mg/dL (ref 6–20)
CO2: 26 mmol/L (ref 22–32)
CREATININE: 0.98 mg/dL (ref 0.44–1.00)
Calcium: 9.1 mg/dL (ref 8.9–10.3)
Chloride: 106 mmol/L (ref 98–111)
GFR calc non Af Amer: >60 mL/min (ref >60)
Glucose, Bld: 86 mg/dL (ref 70–99)
Potassium: 3.5 mmol/L (ref 3.5–5.1)
SODIUM: 141 mmol/L (ref 135–145)

## 2018-07-27 NOTE — Pre-Procedure Instructions (Signed)
CBC results 07/27/2018 sent to Dr. Berenice Primas via epic.

## 2018-07-28 LAB — SURGICAL PCR SCREEN
MRSA, PCR: POSITIVE — AB
Staphylococcus aureus: POSITIVE — AB

## 2018-07-28 NOTE — Pre-Procedure Instructions (Signed)
PCR results 07/27/2018 sent to Dr. Ninfa Linden via epic.

## 2018-07-30 MED ORDER — VANCOMYCIN HCL 10 G IV SOLR
1500.0000 mg | Freq: Once | INTRAVENOUS | Status: AC
  Administered 2018-07-31: 1500 mg via INTRAVENOUS
  Filled 2018-07-30: qty 1500

## 2018-07-31 ENCOUNTER — Inpatient Hospital Stay (HOSPITAL_COMMUNITY)
Admission: RE | Admit: 2018-07-31 | Discharge: 2018-08-03 | DRG: 470 | Disposition: A | Discharge status: Home Health | Payer: BC Managed Care – PPO | Attending: Orthopaedic Surgery | Admitting: Orthopaedic Surgery

## 2018-07-31 ENCOUNTER — Other Ambulatory Visit: Payer: Self-pay

## 2018-07-31 ENCOUNTER — Inpatient Hospital Stay (HOSPITAL_COMMUNITY): Payer: BC Managed Care – PPO | Admitting: Physician Assistant

## 2018-07-31 ENCOUNTER — Inpatient Hospital Stay (HOSPITAL_COMMUNITY): Payer: BC Managed Care – PPO

## 2018-07-31 ENCOUNTER — Encounter (HOSPITAL_COMMUNITY): Payer: Self-pay | Admitting: *Deleted

## 2018-07-31 ENCOUNTER — Inpatient Hospital Stay (HOSPITAL_COMMUNITY): Payer: BC Managed Care – PPO | Admitting: Anesthesiology

## 2018-07-31 ENCOUNTER — Encounter (HOSPITAL_COMMUNITY)
Admission: RE | Disposition: A | Discharge status: Home Health | Payer: Self-pay | Source: Home / Self Care | Attending: Orthopaedic Surgery

## 2018-07-31 DIAGNOSIS — Z8249 Family history of ischemic heart disease and other diseases of the circulatory system: Secondary | ICD-10-CM

## 2018-07-31 DIAGNOSIS — Z9049 Acquired absence of other specified parts of digestive tract: Secondary | ICD-10-CM

## 2018-07-31 DIAGNOSIS — M879 Osteonecrosis, unspecified: Secondary | ICD-10-CM | POA: Diagnosis present

## 2018-07-31 DIAGNOSIS — I10 Essential (primary) hypertension: Secondary | ICD-10-CM | POA: Diagnosis present

## 2018-07-31 DIAGNOSIS — F329 Major depressive disorder, single episode, unspecified: Secondary | ICD-10-CM | POA: Diagnosis present

## 2018-07-31 DIAGNOSIS — E890 Postprocedural hypothyroidism: Secondary | ICD-10-CM | POA: Diagnosis present

## 2018-07-31 DIAGNOSIS — Z8261 Family history of arthritis: Secondary | ICD-10-CM | POA: Diagnosis not present

## 2018-07-31 DIAGNOSIS — Z6841 Body Mass Index (BMI) 40.0 and over, adult: Secondary | ICD-10-CM | POA: Diagnosis not present

## 2018-07-31 DIAGNOSIS — M25752 Osteophyte, left hip: Secondary | ICD-10-CM | POA: Diagnosis present

## 2018-07-31 DIAGNOSIS — Z87891 Personal history of nicotine dependence: Secondary | ICD-10-CM | POA: Diagnosis not present

## 2018-07-31 DIAGNOSIS — Z96642 Presence of left artificial hip joint: Secondary | ICD-10-CM

## 2018-07-31 DIAGNOSIS — Z96641 Presence of right artificial hip joint: Secondary | ICD-10-CM | POA: Diagnosis present

## 2018-07-31 DIAGNOSIS — M1612 Unilateral primary osteoarthritis, left hip: Principal | ICD-10-CM | POA: Diagnosis present

## 2018-07-31 DIAGNOSIS — K760 Fatty (change of) liver, not elsewhere classified: Secondary | ICD-10-CM | POA: Diagnosis present

## 2018-07-31 DIAGNOSIS — Z419 Encounter for procedure for purposes other than remedying health state, unspecified: Secondary | ICD-10-CM

## 2018-07-31 HISTORY — PX: TOTAL HIP ARTHROPLASTY: SHX124

## 2018-07-31 LAB — PREGNANCY, URINE: Preg Test, Ur: NEGATIVE

## 2018-07-31 SURGERY — ARTHROPLASTY, HIP, TOTAL, ANTERIOR APPROACH
Anesthesia: General | Site: Hip | Laterality: Left

## 2018-07-31 MED ORDER — METOCLOPRAMIDE HCL 5 MG/ML IJ SOLN: 5.0000 mg | Freq: Three times a day (TID) | INTRAMUSCULAR | Status: DC | PRN

## 2018-07-31 MED ORDER — BUPIVACAINE IN DEXTROSE 0.75-8.25 % IT SOLN
INTRATHECAL | Status: DC | PRN
  Administered 2018-07-31: 1.6 mL via INTRATHECAL

## 2018-07-31 MED ORDER — SUGAMMADEX SODIUM 500 MG/5ML IV SOLN
INTRAVENOUS | Status: AC
  Filled 2018-07-31: qty 5

## 2018-07-31 MED ORDER — METHOCARBAMOL 500 MG IVPB - SIMPLE MED
INTRAVENOUS | Status: AC
  Filled 2018-07-31: qty 50

## 2018-07-31 MED ORDER — FENTANYL CITRATE (PF) 100 MCG/2ML IJ SOLN
INTRAMUSCULAR | Status: DC | PRN
  Administered 2018-07-31: 50 ug via INTRAVENOUS
  Administered 2018-07-31: 100 ug via INTRAVENOUS
  Administered 2018-07-31: 50 ug via INTRAVENOUS
  Administered 2018-07-31: 100 ug via INTRAVENOUS

## 2018-07-31 MED ORDER — FENTANYL CITRATE (PF) 100 MCG/2ML IJ SOLN
25.0000 ug | INTRAMUSCULAR | Status: DC | PRN
  Administered 2018-07-31: 50 ug via INTRAVENOUS
  Administered 2018-07-31 (×2): 25 ug via INTRAVENOUS

## 2018-07-31 MED ORDER — LIDOCAINE 2% (20 MG/ML) 5 ML SYRINGE
INTRAMUSCULAR | Status: DC | PRN
  Administered 2018-07-31: 60 mg via INTRAVENOUS

## 2018-07-31 MED ORDER — ONDANSETRON HCL 4 MG PO TABS: 4.0000 mg | ORAL_TABLET | Freq: Four times a day (QID) | ORAL | Status: DC | PRN

## 2018-07-31 MED ORDER — OXYCODONE HCL 5 MG PO TABS
5.0000 mg | ORAL_TABLET | Freq: Once | ORAL | Status: AC | PRN
  Administered 2018-07-31: 5 mg via ORAL

## 2018-07-31 MED ORDER — PHENOL 1.4 % MT LIQD
1.0000 | OROMUCOSAL | Status: DC | PRN
  Administered 2018-08-02: 1 via OROMUCOSAL
  Filled 2018-07-31: qty 177

## 2018-07-31 MED ORDER — ACETAMINOPHEN 325 MG PO TABS: 325.0000 mg | ORAL_TABLET | Freq: Four times a day (QID) | ORAL | Status: DC | PRN

## 2018-07-31 MED ORDER — PROPOFOL 500 MG/50ML IV EMUL
INTRAVENOUS | Status: DC | PRN
  Administered 2018-07-31: 20 ug/kg/min via INTRAVENOUS

## 2018-07-31 MED ORDER — CEFAZOLIN SODIUM-DEXTROSE 2-4 GM/100ML-% IV SOLN
2.0000 g | INTRAVENOUS | Status: AC
  Administered 2018-07-31: 2 g via INTRAVENOUS
  Filled 2018-07-31: qty 100

## 2018-07-31 MED ORDER — DIPHENHYDRAMINE HCL 12.5 MG/5ML PO ELIX: 12.5000 mg | ORAL_SOLUTION | ORAL | Status: DC | PRN

## 2018-07-31 MED ORDER — DEXAMETHASONE SODIUM PHOSPHATE 10 MG/ML IJ SOLN
INTRAMUSCULAR | Status: AC
  Filled 2018-07-31: qty 1

## 2018-07-31 MED ORDER — DOCUSATE SODIUM 100 MG PO CAPS
100.0000 mg | ORAL_CAPSULE | Freq: Two times a day (BID) | ORAL | Status: DC
  Administered 2018-07-31 – 2018-08-03 (×6): 100 mg via ORAL
  Filled 2018-07-31 (×6): qty 1

## 2018-07-31 MED ORDER — MIDAZOLAM HCL 5 MG/5ML IJ SOLN
INTRAMUSCULAR | Status: DC | PRN
  Administered 2018-07-31: 2 mg via INTRAVENOUS

## 2018-07-31 MED ORDER — CHLORHEXIDINE GLUCONATE 4 % EX LIQD: 60.0000 mL | Freq: Once | CUTANEOUS | Status: DC

## 2018-07-31 MED ORDER — OXYCODONE HCL 5 MG/5ML PO SOLN
5.0000 mg | Freq: Once | ORAL | Status: AC | PRN
  Filled 2018-07-31: qty 5

## 2018-07-31 MED ORDER — BENAZEPRIL HCL 20 MG PO TABS
20.0000 mg | ORAL_TABLET | Freq: Every day | ORAL | Status: DC
  Administered 2018-08-01 – 2018-08-03 (×3): 20 mg via ORAL
  Filled 2018-07-31 (×3): qty 1

## 2018-07-31 MED ORDER — SUGAMMADEX SODIUM 500 MG/5ML IV SOLN
INTRAVENOUS | Status: DC | PRN
  Administered 2018-07-31: 250 mg via INTRAVENOUS

## 2018-07-31 MED ORDER — OXYCODONE HCL 5 MG PO TABS
ORAL_TABLET | ORAL | Status: AC
  Filled 2018-07-31: qty 1

## 2018-07-31 MED ORDER — FENTANYL CITRATE (PF) 100 MCG/2ML IJ SOLN
INTRAMUSCULAR | Status: AC
  Filled 2018-07-31: qty 4

## 2018-07-31 MED ORDER — STERILE WATER FOR IRRIGATION IR SOLN
Status: DC | PRN
  Administered 2018-07-31: 2000 mL

## 2018-07-31 MED ORDER — METHOCARBAMOL 500 MG IVPB - SIMPLE MED
500.0000 mg | Freq: Four times a day (QID) | INTRAVENOUS | Status: DC | PRN
  Administered 2018-07-31: 500 mg via INTRAVENOUS
  Filled 2018-07-31: qty 50

## 2018-07-31 MED ORDER — ASPIRIN 81 MG PO CHEW
81.0000 mg | CHEWABLE_TABLET | Freq: Two times a day (BID) | ORAL | Status: DC
  Administered 2018-07-31 – 2018-08-03 (×6): 81 mg via ORAL
  Filled 2018-07-31 (×6): qty 1

## 2018-07-31 MED ORDER — ALUM & MAG HYDROXIDE-SIMETH 200-200-20 MG/5ML PO SUSP: 30.0000 mL | ORAL | Status: DC | PRN

## 2018-07-31 MED ORDER — FENTANYL CITRATE (PF) 100 MCG/2ML IJ SOLN
INTRAMUSCULAR | Status: AC
  Filled 2018-07-31: qty 2

## 2018-07-31 MED ORDER — ROCURONIUM BROMIDE 100 MG/10ML IV SOLN
INTRAVENOUS | Status: DC | PRN
  Administered 2018-07-31: 70 mg via INTRAVENOUS

## 2018-07-31 MED ORDER — SODIUM CHLORIDE 0.9 % IV SOLN
INTRAVENOUS | Status: DC
  Administered 2018-07-31: 15:00:00 via INTRAVENOUS

## 2018-07-31 MED ORDER — MENTHOL 3 MG MT LOZG
1.0000 | LOZENGE | OROMUCOSAL | Status: DC | PRN
  Filled 2018-07-31: qty 9

## 2018-07-31 MED ORDER — DULOXETINE HCL 30 MG PO CPEP
30.0000 mg | ORAL_CAPSULE | Freq: Two times a day (BID) | ORAL | Status: DC
  Administered 2018-07-31 – 2018-08-03 (×6): 30 mg via ORAL
  Filled 2018-07-31 (×6): qty 1

## 2018-07-31 MED ORDER — MIDAZOLAM HCL 2 MG/2ML IJ SOLN
INTRAMUSCULAR | Status: AC
  Filled 2018-07-31: qty 2

## 2018-07-31 MED ORDER — PANTOPRAZOLE SODIUM 40 MG PO TBEC
40.0000 mg | DELAYED_RELEASE_TABLET | Freq: Every day | ORAL | Status: DC
  Administered 2018-07-31 – 2018-08-03 (×4): 40 mg via ORAL
  Filled 2018-07-31 (×4): qty 1

## 2018-07-31 MED ORDER — SODIUM CHLORIDE 0.9 % IR SOLN
Status: DC | PRN
  Administered 2018-07-31: 1000 mL

## 2018-07-31 MED ORDER — PROPOFOL 10 MG/ML IV BOLUS
INTRAVENOUS | Status: DC | PRN
  Administered 2018-07-31: 160 mg via INTRAVENOUS
  Administered 2018-07-31 (×2): 20 mg via INTRAVENOUS

## 2018-07-31 MED ORDER — PROPOFOL 10 MG/ML IV BOLUS
INTRAVENOUS | Status: AC
  Filled 2018-07-31: qty 60

## 2018-07-31 MED ORDER — OXYCODONE HCL 5 MG PO TABS
5.0000 mg | ORAL_TABLET | ORAL | Status: DC | PRN
  Administered 2018-07-31 (×2): 5 mg via ORAL
  Administered 2018-08-01 (×4): 10 mg via ORAL
  Administered 2018-08-02: 5 mg via ORAL
  Administered 2018-08-02 (×2): 10 mg via ORAL
  Administered 2018-08-03: 5 mg via ORAL
  Filled 2018-07-31: qty 1
  Filled 2018-07-31: qty 2
  Filled 2018-07-31: qty 1
  Filled 2018-07-31 (×2): qty 2
  Filled 2018-07-31: qty 1
  Filled 2018-07-31 (×2): qty 2

## 2018-07-31 MED ORDER — ONDANSETRON HCL 4 MG/2ML IJ SOLN
INTRAMUSCULAR | Status: DC | PRN
  Administered 2018-07-31: 4 mg via INTRAVENOUS

## 2018-07-31 MED ORDER — ONDANSETRON HCL 4 MG/2ML IJ SOLN
INTRAMUSCULAR | Status: AC
  Filled 2018-07-31: qty 2

## 2018-07-31 MED ORDER — METHOCARBAMOL 500 MG PO TABS
500.0000 mg | ORAL_TABLET | Freq: Four times a day (QID) | ORAL | Status: DC | PRN
  Administered 2018-08-01 – 2018-08-02 (×3): 500 mg via ORAL
  Filled 2018-07-31 (×3): qty 1

## 2018-07-31 MED ORDER — AMLODIPINE BESY-BENAZEPRIL HCL 10-20 MG PO CAPS: 1.0000 | ORAL_CAPSULE | Freq: Every day | ORAL | Status: DC

## 2018-07-31 MED ORDER — CEFAZOLIN SODIUM-DEXTROSE 1-4 GM/50ML-% IV SOLN
1.0000 g | Freq: Four times a day (QID) | INTRAVENOUS | Status: AC
  Administered 2018-07-31 – 2018-08-01 (×2): 1 g via INTRAVENOUS
  Filled 2018-07-31 (×2): qty 50

## 2018-07-31 MED ORDER — ZOLPIDEM TARTRATE 5 MG PO TABS: 5.0000 mg | ORAL_TABLET | Freq: Every evening | ORAL | Status: DC | PRN

## 2018-07-31 MED ORDER — ONDANSETRON HCL 4 MG/2ML IJ SOLN: 4.0000 mg | Freq: Four times a day (QID) | INTRAMUSCULAR | Status: DC | PRN

## 2018-07-31 MED ORDER — AMLODIPINE BESYLATE 10 MG PO TABS
10.0000 mg | ORAL_TABLET | Freq: Every day | ORAL | Status: DC
  Administered 2018-08-01 – 2018-08-03 (×3): 10 mg via ORAL
  Filled 2018-07-31 (×3): qty 1

## 2018-07-31 MED ORDER — TRANEXAMIC ACID-NACL 1000-0.7 MG/100ML-% IV SOLN
1000.0000 mg | INTRAVENOUS | Status: AC
  Administered 2018-07-31: 1000 mg via INTRAVENOUS
  Filled 2018-07-31: qty 100

## 2018-07-31 MED ORDER — PROMETHAZINE HCL 25 MG/ML IJ SOLN: 6.2500 mg | INTRAMUSCULAR | Status: DC | PRN

## 2018-07-31 MED ORDER — METOCLOPRAMIDE HCL 5 MG PO TABS: 5.0000 mg | ORAL_TABLET | Freq: Three times a day (TID) | ORAL | Status: DC | PRN

## 2018-07-31 MED ORDER — HYDROMORPHONE HCL 1 MG/ML IJ SOLN
0.5000 mg | INTRAMUSCULAR | Status: DC | PRN
  Administered 2018-07-31: 1 mg via INTRAVENOUS
  Filled 2018-07-31: qty 1

## 2018-07-31 MED ORDER — OXYCODONE HCL 5 MG PO TABS
10.0000 mg | ORAL_TABLET | ORAL | Status: DC | PRN
  Administered 2018-07-31: 10 mg via ORAL
  Filled 2018-07-31 (×3): qty 2

## 2018-07-31 MED ORDER — LACTATED RINGERS IV SOLN
INTRAVENOUS | Status: DC
  Administered 2018-07-31 (×2): via INTRAVENOUS

## 2018-07-31 MED ORDER — POLYETHYLENE GLYCOL 3350 17 G PO PACK
17.0000 g | PACK | Freq: Every day | ORAL | Status: DC | PRN
  Administered 2018-08-01: 17 g via ORAL
  Filled 2018-07-31: qty 1

## 2018-07-31 MED ORDER — DEXAMETHASONE SODIUM PHOSPHATE 10 MG/ML IJ SOLN
INTRAMUSCULAR | Status: DC | PRN
  Administered 2018-07-31: 10 mg via INTRAVENOUS

## 2018-07-31 SURGICAL SUPPLY — 43 items
BAG SPEC THK2 15X12 ZIP CLS (MISCELLANEOUS) ×1
BAG ZIPLOCK 12X15 (MISCELLANEOUS) ×2 IMPLANT
BLADE SAW SGTL 18X1.27X75 (BLADE) ×2 IMPLANT
BLADE SAW SGTL 18X1.27X75MM (BLADE) ×1
BLADE SURG SZ10 CARB STEEL (BLADE) ×6 IMPLANT
COVER PERINEAL POST (MISCELLANEOUS) ×3 IMPLANT
COVER SURGICAL LIGHT HANDLE (MISCELLANEOUS) ×3 IMPLANT
DRAPE STERI IOBAN 125X83 (DRAPES) ×3 IMPLANT
DRAPE U-SHAPE 47X51 STRL (DRAPES) ×6 IMPLANT
DRSG AQUACEL AG ADV 3.5X10 (GAUZE/BANDAGES/DRESSINGS) ×3 IMPLANT
DURAPREP 26ML APPLICATOR (WOUND CARE) ×3 IMPLANT
ELECT REM PT RETURN 15FT ADLT (MISCELLANEOUS) ×3 IMPLANT
GAUZE XEROFORM 1X8 LF (GAUZE/BANDAGES/DRESSINGS) ×2 IMPLANT
GLOVE BIO SURGEON STRL SZ7.5 (GLOVE) ×3 IMPLANT
GLOVE BIOGEL PI IND STRL 7.0 (GLOVE) IMPLANT
GLOVE BIOGEL PI IND STRL 7.5 (GLOVE) IMPLANT
GLOVE BIOGEL PI IND STRL 8 (GLOVE) ×2 IMPLANT
GLOVE BIOGEL PI IND STRL 9 (GLOVE) IMPLANT
GLOVE BIOGEL PI INDICATOR 7.0 (GLOVE) ×4
GLOVE BIOGEL PI INDICATOR 7.5 (GLOVE) ×4
GLOVE BIOGEL PI INDICATOR 8 (GLOVE) ×4
GLOVE BIOGEL PI INDICATOR 9 (GLOVE) ×2
GLOVE ECLIPSE 8.0 STRL XLNG CF (GLOVE) ×3 IMPLANT
GLOVE SURG ORTHO 9.0 STRL STRW (GLOVE) ×2 IMPLANT
GOWN SPEC L3 XXLG W/TWL (GOWN DISPOSABLE) ×2 IMPLANT
GOWN STRL REUS W/TWL XL LVL3 (GOWN DISPOSABLE) ×8 IMPLANT
HANDPIECE INTERPULSE COAX TIP (DISPOSABLE) ×3
HEAD CERAMIC DELTA 36 PLUS 1.5 (Hips) ×2 IMPLANT
HOLDER FOLEY CATH W/STRAP (MISCELLANEOUS) ×3 IMPLANT
LINER ACETAB NEUTRAL 36ID 520D (Liner) ×2 IMPLANT
PACK ANTERIOR HIP CUSTOM (KITS) ×3 IMPLANT
PIN SECTOR W/GRIP ACE CUP 52MM (Hips) ×2 IMPLANT
SCREW 6.5MMX25MM (Screw) ×2 IMPLANT
SET HNDPC FAN SPRY TIP SCT (DISPOSABLE) ×1 IMPLANT
STAPLER VISISTAT 35W (STAPLE) ×2 IMPLANT
STEM CORAIL KLA10 (Stem) ×2 IMPLANT
SUT ETHIBOND NAB CT1 #1 30IN (SUTURE) ×3 IMPLANT
SUT VIC AB 0 CT1 36 (SUTURE) ×3 IMPLANT
SUT VIC AB 1 CT1 36 (SUTURE) ×3 IMPLANT
SUT VIC AB 2-0 CT1 27 (SUTURE) ×6
SUT VIC AB 2-0 CT1 TAPERPNT 27 (SUTURE) ×2 IMPLANT
TRAY FOLEY MTR SLVR 14FR STAT (SET/KITS/TRAYS/PACK) ×2 IMPLANT
YANKAUER SUCT BULB TIP 10FT TU (MISCELLANEOUS) ×3 IMPLANT

## 2018-07-31 NOTE — Anesthesia Preprocedure Evaluation (Addendum)
Anesthesia Evaluation  Patient identified by MRN, date of birth, ID band Patient awake    Reviewed: Allergy & Precautions, NPO status , Patient's Chart, lab work & pertinent test results  History of Anesthesia Complications Negative for: history of anesthetic complications  Airway Mallampati: II  TM Distance: >3 FB Neck ROM: Full    Dental  (+) Dental Advisory Given, Teeth Intact   Pulmonary former smoker,    breath sounds clear to auscultation       Cardiovascular hypertension, Pt. on medications  Rhythm:Regular Rate:Normal     Neuro/Psych PSYCHIATRIC DISORDERS Depression negative neurological ROS     GI/Hepatic negative GI ROS, (+)     substance abuse  marijuana use,   Endo/Other  Morbid obesity  Renal/GU negative Renal ROS     Musculoskeletal  (+) Arthritis ,  Back pain "Pinched nerve" in back    Abdominal (+) + obese,   Peds  Hematology  (+) anemia ,   Anesthesia Other Findings   Reproductive/Obstetrics                            Anesthesia Physical Anesthesia Plan  ASA: III  Anesthesia Plan: Spinal   Post-op Pain Management:    Induction:   PONV Risk Score and Plan: 2 and Treatment may vary due to age or medical condition and Propofol infusion  Airway Management Planned: Natural Airway and Simple Face Mask  Additional Equipment: None  Intra-op Plan:   Post-operative Plan:   Informed Consent: I have reviewed the patients History and Physical, chart, labs and discussed the procedure including the risks, benefits and alternatives for the proposed anesthesia with the patient or authorized representative who has indicated his/her understanding and acceptance.       Plan Discussed with: CRNA and Anesthesiologist  Anesthesia Plan Comments: (Labs reviewed, platelets acceptable. Discussed risks and benefits of spinal, including spinal/epidural hematoma, infection,  failed block, and PDPH. Patient expressed understanding and wished to proceed. )       Anesthesia Quick Evaluation

## 2018-07-31 NOTE — H&P (Signed)
TOTAL HIP ADMISSION H&P  Patient is admitted for left total hip arthroplasty.  Subjective:  Chief Complaint: left hip pain  HPI: Rachel Warren, 43 y.o. female, has a history of pain and functional disability in the left hip(s) due to arthritis and patient has failed non-surgical conservative treatments for greater than 12 weeks to include NSAID's and/or analgesics, corticosteriod injections, flexibility and strengthening excercises, supervised PT with diminished ADL's post treatment, use of assistive devices, weight reduction as appropriate and activity modification.  Onset of symptoms was gradual starting 3 years ago with rapidlly worsening course since that time.The patient noted no past surgery on the left hip(s).  Patient currently rates pain in the left hip at 10 out of 10 with activity. Patient has night pain, worsening of pain with activity and weight bearing, trendelenberg gait, pain that interfers with activities of daily living, pain with passive range of motion and crepitus. Patient has evidence of subchondral cysts, subchondral sclerosis, periarticular osteophytes and joint space narrowing by imaging studies. This condition presents safety issues increasing the risk of falls.  There is no current active infection.  Patient Active Problem List   Diagnosis Date Noted  . Fibroids 01/22/2018  . 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 Unilateral primary osteoarthritis, left hip 11/06/2017  . Unilateral primary osteoarthritis, right hip 11/06/2017  . Lumbosacral radiculopathy at S1 09/12/2017  . Back pain 08/19/2017  . 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  . Abnormal gait 08/19/2017  . Obesity 08/19/2017  . Ventral hernia s/p laparoscopic repair with mesh 02/27/15 02/27/2015   Past Medical History:  Diagnosis Date  . Anemia   . Arthritis   . Back pain    lower back   . Complication of anesthesia    age 55 had twilight anesthesia and woke up during surgery   .  Depression   . Fatty liver 01/22/2016   Noted on MRI abd  . Fibroids   . Heavy periods   . History of gallstones   . History of thyroid nodule    Left  . Hypertension   . Irregular periods   . Obesity   . Pancreatic lesion   . Pinched nerve    in back  . Ventral hernia 12/26/2014   Midline noted on CT Abd pelvis    Past Surgical History:  Procedure Laterality Date  . benign breast cyst removed Left    age 71  . CHOLECYSTECTOMY    . INSERTION OF MESH N/A 02/27/2015   Procedure: INSERTION OF MESH;  Surgeon: Jackolyn Confer, MD;  Location: [facility];  Service: General;  Laterality: N/A;  . LAPAROSCOPIC ASSISTED VENTRAL HERNIA REPAIR N/A 02/27/2015   Procedure: LAPAROSCOPIC VENTRAL HERNIA REPAIR WITH MESH;  Surgeon: Jackolyn Confer, MD;  Location: [facility];  Service: General;  Laterality: N/A;  . THYROIDECTOMY, PARTIAL    . TOTAL HIP ARTHROPLASTY Right 11/28/2017   Procedure: RIGHT TOTAL HIP ARTHROPLASTY ANTERIOR APPROACH;  Surgeon: Mcarthur Rossetti, MD;  Location: [facility];  Service: Orthopedics;  Laterality: Right;    Current Facility-Administered Medications  Medication Dose Route Frequency Provider Last Rate Last Dose  . ceFAZolin (ANCEF) IVPB 2g/100 mL premix  2 g Intravenous On Call to OR Pete Pelt, PA-C      . chlorhexidine (HIBICLENS) 4 % liquid 4 application  60 mL Topical Once Pete Pelt, PA-C      . lactated ringers infusion   Intravenous Continuous Audry Pili, MD 50 mL/hr at 07/31/18  1045    . tranexamic acid (CYKLOKAPRON) IVPB 1,000 mg  1,000 mg Intravenous To OR Pete Pelt, PA-C      . vancomycin (VANCOCIN) 1,500 mg in sodium chloride 0.9 % 500 mL IVPB  1,500 mg Intravenous Once Mcarthur Rossetti, MD       No Known Allergies  Social History   Tobacco Use  . Smoking status: Former Smoker    Types: Cigarettes    Last attempt to quit: 06/14/2017    Years since quitting: 1.1  . Smokeless tobacco: Never Used  Substance Use Topics  .  Alcohol use: Yes    Comment: occasionally    Family History  Problem Relation Age of Onset  . Hypertension Father   . Arthritis Father   . Clotting disorder Father   . Prostate cancer Paternal Grandfather      Review of Systems  Musculoskeletal: Positive for joint pain.  All other systems reviewed and are negative.   Objective:  Physical Exam  Constitutional: She is oriented to person, place, and time. She appears well-developed and well-nourished.  HENT:  Head: Normocephalic and atraumatic.  Eyes: Pupils are equal, round, and reactive to light. EOM are normal.  Neck: Normal range of motion. Neck supple.  Cardiovascular: Normal rate and regular rhythm.  Respiratory: Effort normal and breath sounds normal.  GI: Soft. Bowel sounds are normal.  Musculoskeletal:     Left hip: She exhibits decreased range of motion, decreased strength, tenderness and bony tenderness.  Neurological: She is alert and oriented to person, place, and time.  Skin: Skin is warm and dry.  Psychiatric: She has a normal mood and affect.    Vital signs in last 24 hours: Temp:  [98.9 F (37.2 C)] 98.9 F (37.2 C) (01/17 1015) Pulse Rate:  [72] 72 (01/17 1015) Resp:  [18] 18 (01/17 1015) BP: (135)/(86) 135/86 (01/17 1015) SpO2:  [99 %] 99 % (01/17 1015) Weight:  [114.1 kg] 114.1 kg (01/17 1034)  Labs:   Estimated body mass index is 40.59 kg/m as calculated from the following:   Height as of this encounter: 5\' 6"  (1.676 m).   Weight as of this encounter: 114.1 kg.   Imaging Review Plain radiographs demonstrate severe degenerative joint disease of the left hip(s). The bone quality appears to be excellent for age and reported activity level.    Preoperative templating of the joint replacement has been completed, documented, and submitted to the Operating Room personnel in order to optimize intra-operative equipment management.     Assessment/Plan:  End stage arthritis, left hip(s)  The  patient history, physical examination, clinical judgement of the provider and imaging studies are consistent with end stage degenerative joint disease of the left hip(s) and total hip arthroplasty is deemed medically necessary. The treatment options including medical management, injection therapy, arthroscopy and arthroplasty were discussed at length. The risks and benefits of total hip arthroplasty were presented and reviewed. The risks due to aseptic loosening, infection, stiffness, dislocation/subluxation,  thromboembolic complications and other imponderables were discussed.  The patient acknowledged the explanation, agreed to proceed with the plan and consent was signed. Patient is being admitted for inpatient treatment for surgery, pain control, PT, OT, prophylactic antibiotics, VTE prophylaxis, progressive ambulation and ADL's and discharge planning.The patient is planning to be discharged home with home health services

## 2018-07-31 NOTE — Brief Op Note (Signed)
07/31/2018  2:15 PM  PATIENT:  Rachel Warren  43 y.o. female  PRE-OPERATIVE DIAGNOSIS:  osteoarthritis left hip  POST-OPERATIVE DIAGNOSIS:  osteoarthritis left hip  PROCEDURE:  Procedure(s) with comments: LEFT TOTAL HIP ARTHROPLASTY ANTERIOR APPROACH (Left) - Failed Spinal  SURGEON:  Surgeon(s) and Role:    Mcarthur Rossetti, MD - Primary  PHYSICIAN ASSISTANT: Benita Stabile, PA-C  ANESTHESIA:   general  EBL:  400 mL   COUNTS:  YES  TOURNIQUET:  * No tourniquets in log *  DICTATION: .Other Dictation: Dictation Number [id number]  PLAN OF CARE: Admit to inpatient   PATIENT DISPOSITION:  PACU - hemodynamically stable.   Delay start of Pharmacological VTE agent (>24hrs) due to surgical blood loss or risk of bleeding: no

## 2018-07-31 NOTE — Anesthesia Procedure Notes (Signed)
Spinal  Patient location during procedure: OR Start time: 07/31/2018 12:50 PM End time: 07/31/2018 12:54 PM Staffing Anesthesiologist: Audry Pili, MD Performed: anesthesiologist  Preanesthetic Checklist Completed: patient identified, surgical consent, pre-op evaluation, timeout performed, IV checked, risks and benefits discussed and monitors and equipment checked Spinal Block Patient position: sitting Prep: DuraPrep Patient monitoring: heart rate, cardiac monitor, continuous pulse ox and blood pressure Approach: midline Location: L3-4 Injection technique: single-shot Needle Needle type: Pencan  Needle gauge: 24 G Needle length: 15 cm Additional Notes Consent was obtained prior to the procedure with all questions answered and concerns addressed. Risks including, but not limited to, bleeding, infection, nerve damage, paralysis, failed block, inadequate analgesia, allergic reaction, high spinal, itching, and headache were discussed and the patient wished to proceed. Functioning IV was confirmed and monitors were applied. Sterile prep and drape, including hand hygiene, mask, and sterile gloves were used. The patient was positioned and the spine was prepped. The skin was anesthetized with lidocaine. Free flow of clear CSF was obtained prior to injecting local anesthetic into the CSF. The spinal needle aspirated freely following injection. The needle was carefully withdrawn. The patient tolerated the procedure well.   Renold Don, MD

## 2018-07-31 NOTE — Transfer of Care (Signed)
Immediate Anesthesia Transfer of Care Note  Patient: Rachel Warren  Procedure(s) Performed: LEFT TOTAL HIP ARTHROPLASTY ANTERIOR APPROACH (Left Hip)  Patient Location: PACU  Anesthesia Type:General  Level of Consciousness: awake and alert   Airway & Oxygen Therapy: Patient Spontanous Breathing and Patient connected to face mask oxygen  Post-op Assessment: Report given to RN and Post -op Vital signs reviewed and stable  Post vital signs: Reviewed and stable  Last Vitals:  Vitals Value Taken Time  BP 134/75 07/31/2018  2:35 PM  Temp    Pulse 87 07/31/2018  2:37 PM  Resp 10 07/31/2018  2:37 PM  SpO2 100 % 07/31/2018  2:37 PM  Vitals shown include unvalidated device data.  Last Pain:  Vitals:   07/31/18 1034  TempSrc:   PainSc: 7       Patients Stated Pain Goal: 4 (05/04/10 7356)  Complications: No apparent anesthesia complications

## 2018-07-31 NOTE — Op Note (Signed)
NAME: Rachel Warren, Rachel Warren [facility] MEDICAL RECORD [id number] ACCOUNT [id number] DATE OF BIRTH:1975-09-10 FACILITY: [facility] LOCATION: [facility] PHYSICIAN:CHRISTOPHER Kerry Fort, MD  OPERATIVE REPORT  DATE OF PROCEDURE:  07/31/2018  PREOPERATIVE DIAGNOSIS:  End-stage arthritis and degenerative joint disease, left hip.  POSTOPERATIVE DIAGNOSIS:  End-stage arthritis and degenerative joint disease, left hip.  PROCEDURE:  Left total hip arthroplasty through direct anterior approach.  IMPLANTS:  DePuy Sector Gription acetabular component size 52 with a single screw, size 36+0 neutral polyethylene liner, size 10 Corail femoral component with varus offset, size 36+1.5 ceramic hip ball.  SURGEON:  Lind Guest. Ninfa Linden, MD  ASSISTANT:  Erskine Emery, PA-C.  ANESTHESIA: 1.  Attempted spinal. 2.  General.  ESTIMATED BLOOD LOSS:  400 mL  ANTIBIOTICS:  Two grams IV Ancef.  COMPLICATIONS:  None.  INDICATIONS:  The patient is a 43 year old patient well known to me.  She has debilitating arthritis at the young age of both her hips.  She underwent a successful right total hip arthroplasty in May of 2019.  Her left hip is completely worn out.  Her  pain is daily and has detrimentally affected activities of daily living, her quality of life and mobility to the point she does wish to proceed with a total hip arthroplasty on the left side and I agree with this having known her so long and seeing how  awful her hip looks on x-ray and clinical exam.  She understands fully having had this before the risk of acute blood loss anemia, nerve or vessel injury, fracture, infection, dislocation, DVT.  She understands her goals are to decrease pain, improve  mobility and overall improve quality of life.  DESCRIPTION OF PROCEDURE:  After informed consent was obtained and appropriate left hip was marked.  She was brought to the operating room, sat up on the stretcher where spinal anesthesia was attempted.  It was not  successful.  She was laid in a supine  position on a stretcher.  General anesthesia was then obtained.  A Foley catheter was placed in her bladder and traction boots were placed on both her feet.  Of note, preoperatively, she was significantly short on her left operative side comparing the  left and right sides.  She was then placed supine on the Hana fracture table, the perineal post in place and both legs in line skeletal traction device and no traction applied.  Her left operative hip was prepped and draped with DuraPrep and sterile  drapes.  A time-out was called to identify correct patient, correct left hip.  We then made an incision just inferior and posterior to the anterior superior iliac spine and carried this obliquely down the leg.  We dissected down tensor fascia lata  muscle.  Tensor fascia was then divided longitudinally to proceed with direct anterior approach to the hip.  We identified and cauterized circumflex vessels and identified the hip capsule, opened the hip capsule in an L-type format, finding moderate  joint effusion and significant periarticular osteophytes around the femoral head and neck.  We placed Cobra retractors around the medial and lateral femoral neck and made our femoral neck cut with an oscillating saw just proximal to the lesser  trochanter.  Completed this with an osteotome.  We placed a corkscrew guide in the femoral head and removed the femoral head in its entirety and found it to be completely devoid of cartilage.  We then placed a bent Hohmann over the medial acetabular rim  and then began reaming from a size  44 reamer in stepwise increments up to a size 51 with all reamers under direct visualization, the last reamer under direct fluoroscopy, so we could obtain our depth of reaming, our inclination and anteversion.  We then  placed the real DePuy Sector Gription acetabular component size 52 and a single screw.  We placed a 36+0 liner for that size acetabular  component.  Attention was then turned to the femur.  With the leg externally rotated to 120 degrees, extended and  adducted, we were able to place a Mueller retractor medially and Hohmann retractor behind the greater trochanter, released lateral joint capsule and used a box-cutting osteotome to enter femoral canal and a rongeur to lateralize then began broaching from  a size 8 broach using Corail broaching system going up to a size 10.  With a size 10 in place, we tried a varus offset femoral neck and 36+1.5 hip ball, reduced this in the acetabulum.  We were pleased with the leg length, offset, range of motion and  stability.  We then irrigated the soft tissue with normal saline solution using pulsatile lavage.  We dislocated the hip and removed the trial components.  We then placed the real Corail femoral component size 10 with varus offset and the real size  36+1.5 ceramic hip ball again reduced this in the acetabulum into the pelvis and we were pleased with stability.  We then irrigated the soft tissue with normal saline solution using pulsatile lavage.  We closed joint capsule with interrupted #1 Ethibond  suture, followed by running 0 Vicryl and tensor fascia, 0 Vicryl in the deep tissue, 2-0 Vicryl subcutaneous tissue and interrupted staples on the skin.  Xeroform and Aquacel dressing was applied.  She was taken off the Hana table, awakened, extubated,  taken to recovery room in stable condition.  All final counts were correct.  There were no complications noted.  Of note, Erskine Emery, PA-C's assistance was crucial throughout all the major aspects of this case.  TN/NUANCE  D:07/31/2018 T:07/31/2018 JOB:[id number]

## 2018-07-31 NOTE — Anesthesia Procedure Notes (Signed)
Date/Time: 07/31/2018 12:48 PM Performed by: Sharlette Dense, CRNA Oxygen Delivery Method: Simple face mask

## 2018-07-31 NOTE — Anesthesia Postprocedure Evaluation (Signed)
Anesthesia Post Note  Patient: Rachel Warren  Procedure(s) Performed: LEFT TOTAL HIP ARTHROPLASTY ANTERIOR APPROACH (Left Hip)     Patient location during evaluation: PACU Anesthesia Type: General Level of consciousness: awake and alert Pain management: pain level controlled Vital Signs Assessment: post-procedure vital signs reviewed and stable Respiratory status: spontaneous breathing, nonlabored ventilation and respiratory function stable Cardiovascular status: blood pressure returned to baseline and stable Postop Assessment: no apparent nausea or vomiting Anesthetic complications: no    Last Vitals:  Vitals:   07/31/18 1436 07/31/18 1445  BP: 134/75 131/81  Pulse: 90 81  Resp: 16 11  Temp: 36.7 C   SpO2: 100% 100%                 Audry Pili

## 2018-07-31 NOTE — Anesthesia Procedure Notes (Signed)
Procedure Name: Intubation Date/Time: 07/31/2018 1:05 PM Performed by: Sharlette Dense, CRNA Oxygen Delivery Method: Circle system utilized Preoxygenation: Pre-oxygenation with 100% oxygen Induction Type: IV induction Ventilation: Mask ventilation without difficulty and Oral airway inserted - appropriate to patient size Laryngoscope Size: Sabra Heck and 2 Grade View: Grade I Tube type: Oral Tube size: 7.5 mm Number of attempts: 1 Airway Equipment and Method: Stylet Placement Confirmation: ETT inserted through vocal cords under direct vision,  positive ETCO2 and breath sounds checked- equal and bilateral Secured at: 21 cm Tube secured with: Tape Dental Injury: Teeth and Oropharynx as per pre-operative assessment

## 2018-08-01 LAB — CBC
HCT: 35.9 % — ABNORMAL LOW (ref 36.0–46.0)
Hemoglobin: 11 g/dL — ABNORMAL LOW (ref 12.0–15.0)
MCH: 26 pg (ref 26.0–34.0)
MCHC: 30.6 g/dL (ref 30.0–36.0)
MCV: 84.9 fL (ref 80.0–100.0)
Platelets: 223 10*3/uL (ref 150–400)
RBC: 4.23 MIL/uL (ref 3.87–5.11)
RDW: 16.1 % — ABNORMAL HIGH (ref 11.5–15.5)
WBC: 7.7 10*3/uL (ref 4.0–10.5)
nRBC: 0 % (ref 0.0–0.2)

## 2018-08-01 LAB — BASIC METABOLIC PANEL
ANION GAP: 9 (ref 5–15)
BUN: 8 mg/dL (ref 6–20)
CHLORIDE: 103 mmol/L (ref 98–111)
CO2: 26 mmol/L (ref 22–32)
Calcium: 8.1 mg/dL — ABNORMAL LOW (ref 8.9–10.3)
Creatinine, Ser: 0.72 mg/dL (ref 0.44–1.00)
GFR calc Af Amer: >60 mL/min (ref >60)
GFR calc non Af Amer: >60 mL/min (ref >60)
Glucose, Bld: 129 mg/dL — ABNORMAL HIGH (ref 70–99)
POTASSIUM: 3.8 mmol/L (ref 3.5–5.1)
Sodium: 138 mmol/L (ref 135–145)

## 2018-08-01 NOTE — Evaluation (Signed)
Physical Therapy Evaluation Patient Details Name: Rachel Warren MRN: [id number] DOB: 1975-12-17 Today's Date: 08/01/2018   History of Present Illness  Patient is a 43 y/o female admitted s/p L DA-THA.  Recent R THA within past year, hernia repair 2016, fibroids, obesity and HTN.   Clinical Impression  Patient presents with decreased independence with mobility mainly due to pain and weakness/decreased ROM L LE.  Feel she will benefit from skilled PT in the acute setting to allow return home with family support and follow up PT.    Follow Up Recommendations Follow surgeon's recommendation for DC plan and follow-up therapies(reports HHPT set up)    Equipment Recommendations  None recommended by PT    Recommendations for Other Services       Precautions / Restrictions Precautions Precautions: Fall Restrictions Weight Bearing Restrictions: No Other Position/Activity Restrictions: WBAT      Mobility  Bed Mobility               General bed mobility comments: up in chair after bathing with nursing  Transfers Overall transfer level: Needs assistance Equipment used: Rolling walker (2 wheeled) Transfers: Sit to/from Stand Sit to Stand: Min guard         General transfer comment: cues for hand placement  Ambulation/Gait Ambulation/Gait assistance: Min guard;+2 safety/equipment(for chair follow) Gait Distance (Feet): 90 Feet Assistive device: Rolling walker (2 wheeled) Gait Pattern/deviations: Step-to pattern;Decreased stride length;Decreased step length - left;Antalgic     General Gait Details: slow pace, but steady, reports burning more with L LE advancement, no c/o light headedness, chair followed for safety  Stairs            Wheelchair Mobility    Modified Rankin (Stroke Patients Only)       Balance Overall balance assessment: Mild deficits observed, not formally tested                                           Pertinent  Vitals/Pain Pain Assessment: 0-10 Pain Score: 8  Pain Location: L hip  Pain Descriptors / Indicators: Burning Pain Intervention(s): Repositioned;Ice applied;Monitored during session    [place] expects to be discharged to:: Private residence Living Arrangements: Spouse/significant other;Children Available Help at Discharge: Family Type of Home: House Home Access: Stairs to enter   Technical brewer of Steps: 1 Home Layout: One level Home Equipment: Environmental consultant - 2 wheels;Bedside commode      Prior Function Level of Independence: Independent         Comments: works as first Land, likes gardening     Hand Dominance        Extremity/Trunk Assessment   Upper Extremity Assessment Upper Extremity Assessment: Overall WFL for tasks assessed    Lower Extremity Assessment Lower Extremity Assessment: LLE deficits/detail LLE Deficits / Details: difficulty with hip flexion and abduction ROM due to pain, strength grossly 3-/5       Communication   Communication: No difficulties  Cognition Arousal/Alertness: Awake/alert Behavior During Therapy: WFL for tasks assessed/performed Overall Cognitive Status: Within Functional Limits for tasks assessed                                        General Comments      Exercises Total Joint Exercises Ankle Circles/Pumps: 10 reps;Both;Seated Quad  Sets: AROM;10 reps;Left;Seated Heel Slides: AAROM;Left;10 reps;Seated Hip ABduction/ADduction: AAROM;10 reps;Left;Seated Long Arc Quad: AROM;Left;10 reps;Seated   Assessment/Plan    PT Assessment Patient needs continued PT services  PT Problem List Decreased strength;Decreased mobility;Decreased range of motion;Pain;Decreased knowledge of use of DME;Decreased activity tolerance       PT Treatment Interventions DME instruction;Functional mobility training;Balance training;Patient/family education;Gait training;Therapeutic activities;Therapeutic  exercise    PT Goals (Current goals can be found in the Care Plan section)  Acute Rehab PT Goals Patient Stated Goal: to return to gardening PT Goal Formulation: With patient Time For Goal Achievement: 08/05/18 Potential to Achieve Goals: Good    Frequency 7X/week   Barriers to discharge        Co-evaluation               AM-PAC PT "6 Clicks" Mobility  Outcome Measure Help needed turning from your back to your side while in a flat bed without using bedrails?: A Little Help needed moving from lying on your back to sitting on the side of a flat bed without using bedrails?: A Little Help needed moving to and from a bed to a chair (including a wheelchair)?: A Little Help needed standing up from a chair using your arms (e.g., wheelchair or bedside chair)?: A Little Help needed to walk in hospital room?: A Little Help needed climbing 3-5 steps with a railing? : A Little 6 Click Score: 18    End of Session Equipment Utilized During Treatment: Gait belt Activity Tolerance: Patient tolerated treatment well Patient left: with call bell/phone within reach;in chair   PT Visit Diagnosis: Difficulty in walking, not elsewhere classified (R26.2);Pain Pain - Right/Left: Left Pain - part of body: Hip    Time: 4008-6761 PT Time Calculation (min) (ACUTE ONLY): 22 min   Charges:   PT Evaluation $PT Eval Low Complexity: [place], [place] Acute Rehabilitation Services [phone] 08/01/2018   [name] 08/01/2018, 11:29 AM

## 2018-08-01 NOTE — Progress Notes (Signed)
OT Cancellation Note  Patient Details Name: Rachel Warren MRN: [id number] DOB: 27-Apr-1976   Cancelled Treatment:    Reason Eval/Treat Not Completed: PT screened, no needs identified, will sign off. Pt recently had another THA  Fallon Haecker 08/01/2018, 2:25 PM  Lesle Chris, OTR/L Acute Rehabilitation Services [phone] [facility] pager [phone] office 08/01/2018

## 2018-08-01 NOTE — Progress Notes (Signed)
Subjective: 1 Day Post-Op Procedure(s) (LRB): LEFT TOTAL HIP ARTHROPLASTY ANTERIOR APPROACH (Left) Patient reports pain as moderate.    Objective: Vital signs in last 24 hours: Temp:  [97.8 F (36.6 C)-98.9 F (37.2 C)] 98.8 F (37.1 C) (01/18 0515) Pulse Rate:  [60-99] 60 (01/18 0515) Resp:  [11-18] 18 (01/18 0515) BP: (118-152)/(64-94) 118/64 (01/18 0515) SpO2:  [99 %-100 %] 100 % (01/18 0515) Weight:  [114.1 kg] 114.1 kg (01/17 1034)  Intake/Output from previous day: 01/17 0701 - 01/18 0700 In: 2950 [I.V.:2700; IV Piggyback:250] Out: 1500 [Urine:1100; Blood:400] Intake/Output this shift: No intake/output data recorded.  Recent Labs    08/01/18 0329  HGB 11.0*   Recent Labs    08/01/18 0329  WBC 7.7  RBC 4.23  HCT 35.9*  PLT 223   Recent Labs    08/01/18 0329  NA 138  K 3.8  CL 103  CO2 26  BUN 8  CREATININE 0.72  GLUCOSE 129*  CALCIUM 8.1*   No results for input(s): LABPT, INR in the last 72 hours.  Sensation intact distally Intact pulses distally Dorsiflexion/Plantar flexion intact Incision: scant drainage  Assessment/Plan: 1 Day Post-Op Procedure(s) (LRB): LEFT TOTAL HIP ARTHROPLASTY ANTERIOR APPROACH (Left) Up with therapy Discharge home with home health next 1-2 days.    Mcarthur Rossetti 08/01/2018, 7:45 AM

## 2018-08-01 NOTE — Progress Notes (Signed)
Physical Therapy Treatment Patient Details Name: Rachel Warren MRN: [id number] DOB: 30-Apr-1976 Today's Date: 08/01/2018    History of Present Illness Patient is a 43 y/o female admitted s/p L DA-THA.  Recent R THA within past year, hernia repair 2016, fibroids, obesity and HTN.     PT Comments    Patient progressing with mobility able to practice car transfer and educated in [place].  Feel stable for home from therapy standpoint, but will follow up in am.   Follow Up Recommendations  Follow surgeon's recommendation for DC plan and follow-up therapies(reports HH set up)     Equipment Recommendations  None recommended by PT    Recommendations for Other Services       Precautions / Restrictions Precautions Precautions: Fall Restrictions Other Position/Activity Restrictions: WBAT    Mobility  Bed Mobility Overal bed mobility: Needs Assistance Bed Mobility: Supine to Sit;Sit to Supine     Supine to sit: HOB elevated;Supervision Sit to supine: Supervision   General bed mobility comments: utilizing crossed leg technique for managing L LE  Transfers Overall transfer level: Needs assistance Equipment used: Rolling walker (2 wheeled) Transfers: Sit to/from Stand Sit to Stand: Min guard         General transfer comment: for safety; also practiced simulated car transfer on edge of therapy mat with min A for L LE  Ambulation/Gait Ambulation/Gait assistance: Min guard;Supervision Gait Distance (Feet): 160 Feet(wtih one seated rest) Assistive device: Rolling walker (2 wheeled) Gait Pattern/deviations: Step-to pattern;Decreased stride length;Decreased step length - left;Antalgic     General Gait Details: slow with painful advancement L LE   Stairs             Wheelchair Mobility    Modified Rankin (Stroke Patients Only)       Balance Overall balance assessment: Mild deficits observed, not formally tested                                           Cognition Arousal/Alertness: Awake/alert Behavior During Therapy: WFL for tasks assessed/performed Overall Cognitive Status: Within Functional Limits for tasks assessed                                        Exercises Other Exercises Other Exercises: educated in HEP and issued handout     General Comments General comments (skin integrity, edema, etc.): toileted in bathroom, min A for donning mesh brief/pad due to menstruation      Pertinent Vitals/Pain Pain Score: 6  Pain Location: L hip  Pain Descriptors / Indicators: Discomfort Pain Intervention(s): Monitored during session;Repositioned;Ice applied    Home Living                      Prior Function            PT Goals (current goals can now be found in the care plan section) Progress towards PT goals: Progressing toward goals    Frequency    7X/week      PT Plan Current plan remains appropriate    Co-evaluation              AM-PAC PT "6 Clicks" Mobility   Outcome Measure  Help needed turning from your back to your side while in a flat bed without using  bedrails?: A Little Help needed moving from lying on your back to sitting on the side of a flat bed without using bedrails?: A Little Help needed moving to and from a bed to a chair (including a wheelchair)?: A Little Help needed standing up from a chair using your arms (e.g., wheelchair or bedside chair)?: A Little Help needed to walk in hospital room?: A Little Help needed climbing 3-5 steps with a railing? : A Little 6 Click Score: 18    End of Session Equipment Utilized During Treatment: Gait belt Activity Tolerance: Patient tolerated treatment well Patient left: in bed;with call bell/phone within reach;with family/visitor present   PT Visit Diagnosis: Difficulty in walking, not elsewhere classified (R26.2);Pain Pain - Right/Left: Left Pain - part of body: Hip     Time: 9024-0973 PT Time Calculation  (min) (ACUTE ONLY): 39 min  Charges:  $Gait Training: 23-37 mins $Self Care/Home Management: [place], [place] [phone] 08/01/2018    [name] 08/01/2018, 4:51 PM

## 2018-08-02 MED ORDER — BISACODYL 10 MG RE SUPP
10.0000 mg | Freq: Every day | RECTAL | Status: DC | PRN
  Administered 2018-08-03: 10 mg via RECTAL
  Filled 2018-08-02: qty 1

## 2018-08-02 MED ORDER — SENNA 8.6 MG PO TABS
1.0000 | ORAL_TABLET | Freq: Once | ORAL | Status: AC
  Administered 2018-08-02: 8.6 mg via ORAL
  Filled 2018-08-02: qty 1

## 2018-08-02 NOTE — Progress Notes (Signed)
Patient requested a suppository to help with having a BM, MD contacted and orders received for sennakot and a dulcolax suppository, patient made aware and meds administered per [facility].

## 2018-08-02 NOTE — Progress Notes (Signed)
Physical Therapy Treatment Patient Details Name: Rachel Warren MRN: [id number] DOB: [birth year] Today's Date: 08/02/2018    History of Present Illness Patient is a 43 y/o female admitted s/p L DA-THA.  Recent R THA within past year, hernia repair 2016, fibroids, obesity and HTN.     PT Comments    Pt without much pain at rest, but 6/10 pain with gait.  She is transferring well, but ambulates with slow antalgic gait pattern.   Follow Up Recommendations  Follow surgeon's recommendation for DC plan and follow-up therapies     Equipment Recommendations  None recommended by PT    Recommendations for Other Services       Precautions / Restrictions Precautions Precautions: Fall Restrictions Weight Bearing Restrictions: No Other Position/Activity Restrictions: WBAT    Mobility  Bed Mobility Overal bed mobility: Modified Independent Bed Mobility: Supine to Sit     Supine to sit: Modified independent (Device/Increase time)     General bed mobility comments: utilizing crossed leg technique for managing L LE  Transfers Overall transfer level: Needs assistance Equipment used: Rolling walker (2 wheeled) Transfers: Sit to/from Stand Sit to Stand: Min guard;Supervision         General transfer comment: Stood from bed with min/guard and toilet with S  Ambulation/Gait Ambulation/Gait assistance: Min Gaffer (Feet): 150 Feet Assistive device: Rolling walker (2 wheeled) Gait Pattern/deviations: Step-to pattern;Decreased stride length;Antalgic;Trunk flexed Gait velocity: decreased   General Gait Details: pain with ambulation, slow speed   Stairs             Wheelchair Mobility    Modified Rankin (Stroke Patients Only)       Balance Overall balance assessment: Mild deficits observed, not formally tested                                          Cognition Arousal/Alertness: Awake/alert Behavior During Therapy:  WFL for tasks assessed/performed Overall Cognitive Status: Within Functional Limits for tasks assessed                                        Exercises Total Joint Exercises Ankle Circles/Pumps: 10 reps;Both;Seated Quad Sets: AROM;10 reps;Left;Seated Heel Slides: AAROM;Left;10 reps;Seated Hip ABduction/ADduction: AAROM;10 reps;Left;Seated    General Comments        Pertinent Vitals/Pain Pain Assessment: 0-10 Pain Score: 6  Pain Location: L hip  Pain Descriptors / Indicators: Discomfort Pain Intervention(s): Limited activity within patient's tolerance;Monitored during session;Repositioned;Ice applied;Premedicated before session    Home Living                      Prior Function            PT Goals (current goals can now be found in the care plan section) Acute Rehab PT Goals Patient Stated Goal: to return to gardening Progress towards PT goals: Progressing toward goals    Frequency    7X/week      PT Plan Current plan remains appropriate    Co-evaluation              AM-PAC PT "6 Clicks" Mobility   Outcome Measure  Help needed turning from your back to your side while in a flat bed without using bedrails?: None Help needed moving from lying on  your back to sitting on the side of a flat bed without using bedrails?: None Help needed moving to and from a bed to a chair (including a wheelchair)?: A Little Help needed standing up from a chair using your arms (e.g., wheelchair or bedside chair)?: A Little Help needed to walk in hospital room?: A Little Help needed climbing 3-5 steps with a railing? : A Little 6 Click Score: 20    End of Session Equipment Utilized During Treatment: Gait belt Activity Tolerance: Patient tolerated treatment well Patient left: in chair;with call bell/phone within reach Nurse Communication: Mobility status PT Visit Diagnosis: Difficulty in walking, not elsewhere classified (R26.2);Pain Pain -  Right/Left: Left Pain - part of body: Hip     Time: 0254-2706 PT Time Calculation (min) (ACUTE ONLY): 33 min  Charges:  $Gait Training: 8-22 mins $Therapeutic Exercise: 8-22 mins                     Adrienna Karis L. [place], [place] Pager [phone] 08/02/2018    Galen Manila 08/02/2018, 11:09 AM

## 2018-08-02 NOTE — Discharge Instructions (Signed)

## 2018-08-02 NOTE — Progress Notes (Signed)
Subjective: 2 Days Post-Op Procedure(s) (LRB): LEFT TOTAL HIP ARTHROPLASTY ANTERIOR APPROACH (Left) Patient reports pain as moderate.    Objective: Vital signs in last 24 hours: Temp:  [98.5 F (36.9 C)-100.1 F (37.8 C)] 100.1 F (37.8 C) (01/19 0426) Pulse Rate:  [96-104] 104 (01/19 0426) Resp:  [17-18] 18 (01/19 0426) BP: (132-141)/(72-87) 141/87 (01/19 0426) SpO2:  [97 %-100 %] 98 % (01/19 0426)  Intake/Output from previous day: 01/18 0701 - 01/19 0700 In: 1107.5 [P.O.:750; I.V.:357.5] Out: 1450 [Urine:1450] Intake/Output this shift: Total I/O In: 120 [P.O.:120] Out: -   Recent Labs    08/01/18 0329  HGB 11.0*   Recent Labs    08/01/18 0329  WBC 7.7  RBC 4.23  HCT 35.9*  PLT 223   Recent Labs    08/01/18 0329  NA 138  K 3.8  CL 103  CO2 26  BUN 8  CREATININE 0.72  GLUCOSE 129*  CALCIUM 8.1*   No results for input(s): LABPT, INR in the last 72 hours.  Sensation intact distally Intact pulses distally Dorsiflexion/Plantar flexion intact Incision: scant drainage   Assessment/Plan: 2 Days Post-Op Procedure(s) (LRB): LEFT TOTAL HIP ARTHROPLASTY ANTERIOR APPROACH (Left) Up with therapy Plan for discharge tomorrow Discharge home with home health    Mcarthur Rossetti 08/02/2018, 11:57 AM

## 2018-08-02 NOTE — Plan of Care (Signed)
  Problem: Education: Goal: Knowledge of the prescribed therapeutic regimen will improve Outcome: Progressing Goal: Understanding of discharge needs will improve Outcome: Progressing Goal: Individualized Educational Video(s) Outcome: Progressing   Problem: Activity: Goal: Ability to avoid complications of mobility impairment will improve Outcome: Progressing Goal: Ability to tolerate increased activity will improve Outcome: Progressing   Problem: Clinical Measurements: Goal: Postoperative complications will be avoided or minimized Outcome: Progressing   Problem: Pain Management: Goal: Pain level will decrease with appropriate interventions Outcome: Progressing   Problem: Skin Integrity: Goal: Will show signs of wound healing Outcome: Progressing   Problem: Health Behavior/Discharge Planning: Goal: Ability to manage health-related needs will improve Outcome: Progressing   Problem: Clinical Measurements: Goal: Ability to maintain clinical measurements within normal limits will improve Outcome: Progressing Goal: Will remain free from infection Outcome: Progressing Goal: Diagnostic test results will improve Outcome: Progressing Goal: Respiratory complications will improve Outcome: Progressing Goal: Cardiovascular complication will be avoided Outcome: Progressing

## 2018-08-02 NOTE — Progress Notes (Signed)
Physical Therapy Treatment Patient Details Name: Rachel Warren MRN: [id number] DOB: 05/01/1976 Today's Date: 08/02/2018    History of Present Illness Patient is a 43 y/o female admitted s/p L DA-THA.  Recent R THA within past year, hernia repair 2016, fibroids, obesity and HTN.     PT Comments    Pt able to improve gait pattern with step through pattern, but had to sit halfway through gait due to feeling weak.  She reports her stomach has been bothering her, so she isn't eating well. Nursing is aware.  Follow Up Recommendations  Follow surgeon's recommendation for DC plan and follow-up therapies     Equipment Recommendations  None recommended by PT    Recommendations for Other Services       Precautions / Restrictions Precautions Precautions: Fall    Mobility  Bed Mobility Overal bed mobility: Modified Independent Bed Mobility: Supine to Sit     Supine to sit: Modified independent (Device/Increase time) Sit to supine: Modified independent (Device/Increase time)   General bed mobility comments: utilizing crossed leg technique for managing L LE  Transfers Overall transfer level: Needs assistance Equipment used: Rolling walker (2 wheeled) Transfers: Sit to/from Stand Sit to Stand: Min assist;Min guard;Supervision         General transfer comment: stood from bed with min/guard, toilet with S, and from recliner with MIN . cues for UE/LE placement  Ambulation/Gait Ambulation/Gait assistance: Min guard;Supervision Gait Distance (Feet): 100 Feet Assistive device: Rolling walker (2 wheeled) Gait Pattern/deviations: Step-through pattern;Antalgic Gait velocity: decreased   General Gait Details: Pt needed to sit halfway through gait due to feeling a little weak.  (she said she hasn't been eating).  After sitting, she was able to walk back and nurse followed behind with a recliner, but no further complaints.   Stairs             Wheelchair Mobility     Modified Rankin (Stroke Patients Only)       Balance Overall balance assessment: Mild deficits observed, not formally tested                                          Cognition Arousal/Alertness: Awake/alert Behavior During Therapy: WFL for tasks assessed/performed Overall Cognitive Status: Within Functional Limits for tasks assessed                                        Exercises Total Joint Exercises Ankle Circles/Pumps: 10 reps;Both;Seated Heel Slides: AAROM;Left;5 reps Long Arc Quad: AROM;Left;Seated;5 reps    General Comments        Pertinent Vitals/Pain Pain Assessment: 0-10 Pain Score: 5  Pain Location: L hip  Pain Descriptors / Indicators: Discomfort Pain Intervention(s): Limited activity within patient's tolerance;Monitored during session;Repositioned;Ice applied    Home Living                      Prior Function            PT Goals (current goals can now be found in the care plan section) Acute Rehab PT Goals PT Goal Formulation: With patient Time For Goal Achievement: 08/05/18 Potential to Achieve Goals: Good Progress towards PT goals: Progressing toward goals    Frequency    7X/week      PT Plan  Current plan remains appropriate    Co-evaluation              AM-PAC PT "6 Clicks" Mobility   Outcome Measure  Help needed turning from your back to your side while in a flat bed without using bedrails?: None Help needed moving from lying on your back to sitting on the side of a flat bed without using bedrails?: None Help needed moving to and from a bed to a chair (including a wheelchair)?: A Little Help needed standing up from a chair using your arms (e.g., wheelchair or bedside chair)?: A Little Help needed to walk in hospital room?: A Little Help needed climbing 3-5 steps with a railing? : A Little 6 Click Score: 20    End of Session Equipment Utilized During Treatment: Gait belt Activity  Tolerance: Other (comment)(limited by feelings of weakness) Patient left: in bed;with call bell/phone within reach Nurse Communication: Mobility status PT Visit Diagnosis: Difficulty in walking, not elsewhere classified (R26.2);Pain Pain - Right/Left: Left Pain - part of body: Hip     Time: 9574-7340 PT Time Calculation (min) (ACUTE ONLY): 27 min  Charges:  $Gait Training: 8-22 mins $Therapeutic Activity: 8-22 mins                     Rachel Warren, [place] Pager [phone] 08/02/2018    Rachel Warren 08/02/2018, 3:41 PM

## 2018-08-03 ENCOUNTER — Encounter (HOSPITAL_COMMUNITY): Payer: Self-pay | Admitting: Orthopaedic Surgery

## 2018-08-03 MED ORDER — OXYCODONE HCL 5 MG PO TABS: 5.0000 mg | ORAL_TABLET | ORAL | 0 refills | Status: DC | PRN

## 2018-08-03 MED ORDER — METHOCARBAMOL 500 MG PO TABS: 500.0000 mg | ORAL_TABLET | Freq: Four times a day (QID) | ORAL | 0 refills | Status: DC | PRN

## 2018-08-03 MED ORDER — ASPIRIN 81 MG PO CHEW: 81.0000 mg | CHEWABLE_TABLET | Freq: Two times a day (BID) | ORAL | 0 refills | Status: DC

## 2018-08-03 NOTE — Discharge Summary (Signed)
Patient ID: Rachel Warren MRN: [id number] DOB/AGE: [birth year] 43 y.o.  Admit date: 07/31/2018 Discharge date: 08/03/2018  Admission Diagnoses:  Principal Problem:   Unilateral primary osteoarthritis, left hip Active Problems:   Status post total replacement of left hip   Discharge Diagnoses:  Same  Past Medical History:  Diagnosis Date  . Anemia   . Arthritis   . Back pain    lower back   . Complication of anesthesia    age 18 had twilight anesthesia and woke up during surgery   . Depression   . Fatty liver 01/22/2016   Noted on MRI abd  . Fibroids   . Heavy periods   . History of gallstones   . History of thyroid nodule    Left  . Hypertension   . Irregular periods   . Obesity   . Pancreatic lesion   . Pinched nerve    in back  . Ventral hernia 12/26/2014   Midline noted on CT Abd pelvis    Surgeries: Procedure(s): LEFT TOTAL HIP ARTHROPLASTY ANTERIOR APPROACH on 07/31/2018   Consultants:   Discharged Condition: Improved  Hospital Course: Rachel Warren is an 43 y.o. female who was admitted 07/31/2018 for operative treatment ofUnilateral primary osteoarthritis, left hip. Patient has severe unremitting pain that affects sleep, daily activities, and work/hobbies. After pre-op clearance the patient was taken to the operating room on 07/31/2018 and underwent  Procedure(s): LEFT TOTAL HIP ARTHROPLASTY ANTERIOR APPROACH.    Patient was given perioperative antibiotics:  Anti-infectives (From admission, onward)   Start     Dose/Rate Route Frequency Ordered Stop   07/31/18 1900  ceFAZolin (ANCEF) IVPB 1 g/50 mL premix     1 g 100 mL/hr over 30 Minutes Intravenous Every 6 hours 07/31/18 1606 08/01/18 0133   07/31/18 1015  ceFAZolin (ANCEF) IVPB 2g/100 mL premix     2 g 200 mL/hr over 30 Minutes Intravenous On call to O.R. 07/31/18 1004 07/31/18 1300   07/31/18 0600  vancomycin (VANCOCIN) 1,500 mg in sodium chloride 0.9 % 500 mL IVPB     1,500 mg 250  mL/hr over 120 Minutes Intravenous  Once 07/30/18 0926 07/31/18 1339       Patient was given sequential compression devices, early ambulation, and chemoprophylaxis to prevent DVT.  Patient benefited maximally from hospital stay and there were no complications.    Recent vital signs:  Patient Vitals for the past 24 hrs:  BP Temp Temp src Pulse Resp SpO2  08/03/18 0508 140/88 98.9 F (37.2 C) - 93 16 98 %  08/02/18 2151 126/73 99 F (37.2 C) - 66 16 97 %  08/02/18 1402 136/80 99.5 F (37.5 C) Oral 100 16 99 %     Recent laboratory studies:  Recent Labs    08/01/18 0329  WBC 7.7  HGB 11.0*  HCT 35.9*  PLT 223  NA 138  K 3.8  CL 103  CO2 26  BUN 8  CREATININE 0.72  GLUCOSE 129*  CALCIUM 8.1*     Discharge Medications:   Allergies as of 08/03/2018   No Known Allergies     Medication List    STOP taking these medications   celecoxib 200 MG capsule Commonly known as:  CELEBREX     TAKE these medications   amLODipine-benazepril 10-20 MG capsule Commonly known as:  LOTREL TAKE 1 CAPSULE BY MOUTH ONCE DAILY   aspirin 81 MG chewable tablet Chew 1 tablet (81 mg total) by mouth 2 (two)  times daily.   DULoxetine 30 MG capsule Commonly known as:  CYMBALTA TAKE 1 CAPSULE BY MOUTH TWICE DAILY   ferrous sulfate 325 (65 FE) MG tablet Take 1 tablet (325 mg total) by mouth daily with breakfast.   leuprolide 11.25 MG injection Commonly known as:  LUPRON DEPOT (40-MONTH) Inject 11.25 mg into the muscle every 3 (three) months.   methocarbamol 500 MG tablet Commonly known as:  ROBAXIN Take 1 tablet (500 mg total) by mouth every 6 (six) hours as needed for muscle spasms.   oxyCODONE 5 MG immediate release tablet Commonly known as:  Oxy IR/ROXICODONE Take 1-2 tablets (5-10 mg total) by mouth every 4 (four) hours as needed for moderate pain (pain score 4-6).            Durable Medical Equipment  (From admission, onward)         Start     Ordered   07/31/18  1607  DME 3 n 1  Once     07/31/18 1606   07/31/18 1607  DME Walker rolling  Once    Question:  Patient needs a walker to treat with the following condition  Answer:  Status post total replacement of left hip   07/31/18 1606          Diagnostic Studies: Dg Chest 2 View  Result Date: 07/28/2018 CLINICAL DATA:  Preop. EXAM: CHEST - 2 VIEW COMPARISON:  None. FINDINGS: Heart and mediastinal contours are within normal limits. No focal opacities or effusions. No acute bony abnormality. IMPRESSION: No active cardiopulmonary disease. Electronically Signed   By: Rolm Baptise M.D.   On: 07/28/2018 08:26   Dg Pelvis Portable  Result Date: 07/31/2018 CLINICAL DATA:  Postop left hip replacement EXAM: PORTABLE PELVIS 1-2 VIEWS COMPARISON:  None. FINDINGS: Interval left total hip arthroplasty without failure complication. Postsurgical changes in the surrounding soft tissues. Normal alignment. Right total hip arthroplasty. No acute fracture or dislocation. IMPRESSION: Interval left total hip arthroplasty. Electronically Signed   By: Kathreen Devoid   On: 07/31/2018 15:34   Dg C-arm 1-60 Min-no Report  Result Date: 07/31/2018 Fluoroscopy was utilized by the requesting physician.  No radiographic interpretation.   Dg Hip Operative Unilat W Or W/o Pelvis Left  Result Date: 07/31/2018 CLINICAL DATA:  Intraoperative imaging for left hip replacement. EXAM: OPERATIVE LEFT HIP (WITH PELVIS IF PERFORMED) 2 VIEWS TECHNIQUE: Fluoroscopic spot image(s) were submitted for interpretation post-operatively. COMPARISON:  None. FINDINGS: Images of the left hip and low pelvis are provided. Left total hip arthroplasty is in place. No acute abnormality is identified. Right total hip arthroplasty noted. IMPRESSION: Intraoperative imaging for left hip replacement.  No acute finding. Electronically Signed   By: Inge Rise M.D.   On: 07/31/2018 14:20   Xr Pelvis 1-2 Views  Result Date: 07/13/2018 An AP pelvis and a lateral  of the right hip shows a total hip arthroplasty on the right side with no complicating features.  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  This is significantly worse when compared to x-rays from last year.   Disposition: Discharge disposition: 01-Home or Self Care         Follow-up Information    Mcarthur Rossetti, MD Follow up in 2 week(s).   Specialty:  Orthopedic Surgery Contact information: [place] [address] [phone]            Signed: Lind Guest   08/03/2018, 7:10 AM

## 2018-08-03 NOTE — Progress Notes (Signed)
Patient ID: Rachel Warren, female   DOB: Oct 26, 1975, 43 y.o.   MRN: [id number] Looks good overall.  Left hip stable.  Can be discharged to home today.

## 2018-08-03 NOTE — Care Management Note (Signed)
Case Management Note  Patient Details  Name: Rachel Warren MRN: [id number] Date of Birth: [birth year]  Subjective/Objective:    Discharge planning, spoke with patient and spouse at bedside. Have chosen [facility] [place] [facility], evaluate and treat.               Action/Plan: Contacted [facility] for referral. They have accepted. Has DME.Marland Kitchen [phone]    Expected Discharge Date:  08/03/18               Expected Discharge Plan:  [place]  In-House Referral:  NA  Discharge planning Services  CM Consult  Post Acute Care Choice:  Home Health Choice offered to:  Patient  DME Arranged:  N/A DME Agency:  NA  HH Arranged:  PT [place] Agency:  [facility] (formerly [employer])  Status of Service:  Completed, signed off  If discussed at [employer], dates discussed:    Additional Comments:  Guadalupe Maple, RN 08/03/2018, 9:11 AM

## 2018-08-03 NOTE — Progress Notes (Signed)
RN reviewed discharge instructions with patient and family. All questions answered.   Paperwork and prescriptions given.   NT rolled patient down with all belongings to family car. 

## 2018-08-03 NOTE — Plan of Care (Signed)
  Problem: Activity: Goal: Ability to avoid complications of mobility impairment will improve Outcome: Progressing   Problem: Pain Management: Goal: Pain level will decrease with appropriate interventions Outcome: Progressing   Problem: Elimination: Goal: Will not experience complications related to bowel motility Outcome: Progressing

## 2018-08-03 NOTE — Progress Notes (Signed)
Physical Therapy Treatment Patient Details Name: Rachel Warren MRN: [id number] DOB: [birth year] Today's Date: 08/03/2018    History of Present Illness Patient is a 43 y/o female admitted s/p L DA-THA.  Recent R THA within past year, hernia repair 2016, fibroids, obesity and HTN.     PT Comments    Pt progressing well with mobility and eager for dc home.  Pt reviewed home therex program, stair and car transfers.   Follow Up Recommendations  Follow surgeon's recommendation for DC plan and follow-up therapies     Equipment Recommendations  None recommended by PT    Recommendations for Other Services       Precautions / Restrictions Precautions Precautions: Fall Restrictions Weight Bearing Restrictions: No Other Position/Activity Restrictions: WBAT    Mobility  Bed Mobility Overal bed mobility: Modified Independent Bed Mobility: Supine to Sit     Supine to sit: Modified independent (Device/Increase time)     General bed mobility comments: utilizing crossed leg technique for managing L LE  Transfers Overall transfer level: Needs assistance Equipment used: Rolling walker (2 wheeled) Transfers: Sit to/from Stand Sit to Stand: Min guard;Supervision         General transfer comment: stood from bed with min/guard, toilet with S,   Ambulation/Gait Ambulation/Gait assistance: Min Gaffer (Feet): 120 Feet(and 15' into bathroom) Assistive device: Rolling walker (2 wheeled) Gait Pattern/deviations: Step-through pattern;Antalgic     General Gait Details: min cues for posture and position from RW   Stairs Stairs: Yes Stairs assistance: Min assist Stair Management: No rails;Step to pattern;Forwards;With walker Number of Stairs: 1 General stair comments: min cues for sequence and foot placement   Wheelchair Mobility    Modified Rankin (Stroke Patients Only)       Balance Overall balance assessment: Mild deficits observed, not  formally tested                                          Cognition Arousal/Alertness: Awake/alert Behavior During Therapy: WFL for tasks assessed/performed Overall Cognitive Status: Within Functional Limits for tasks assessed                                        Exercises Total Joint Exercises Ankle Circles/Pumps: Both;15 reps;Supine;AROM Quad Sets: AROM;10 reps;Seated;Both Heel Slides: AAROM;Left;20 reps;Supine Hip ABduction/ADduction: AAROM;Left;15 reps;Supine    General Comments        Pertinent Vitals/Pain Pain Assessment: 0-10 Pain Score: 4  Pain Location: L hip  Pain Descriptors / Indicators: Discomfort Pain Intervention(s): Limited activity within patient's tolerance;Monitored during session;Premedicated before session;Ice applied    Home Living                      Prior Function            PT Goals (current goals can now be found in the care plan section) Acute Rehab PT Goals Patient Stated Goal: to return to gardening PT Goal Formulation: With patient Time For Goal Achievement: 08/05/18 Potential to Achieve Goals: Good Progress towards PT goals: Progressing toward goals    Frequency    7X/week      PT Plan Current plan remains appropriate    Co-evaluation              AM-PAC PT "6  Clicks" Mobility   Outcome Measure  Help needed turning from your back to your side while in a flat bed without using bedrails?: None Help needed moving from lying on your back to sitting on the side of a flat bed without using bedrails?: None Help needed moving to and from a bed to a chair (including a wheelchair)?: A Little Help needed standing up from a chair using your arms (e.g., wheelchair or bedside chair)?: A Little Help needed to walk in hospital room?: A Little Help needed climbing 3-5 steps with a railing? : A Little 6 Click Score: 20    End of Session Equipment Utilized During Treatment: Gait  belt Activity Tolerance: Patient tolerated treatment well Patient left: with call bell/phone within reach;in chair;with family/visitor present Nurse Communication: Mobility status PT Visit Diagnosis: Difficulty in walking, not elsewhere classified (R26.2);Pain Pain - Right/Left: Left Pain - part of body: Hip     Time: 6256-3893 PT Time Calculation (min) (ACUTE ONLY): 33 min  Charges:  $Gait Training: 8-22 mins $Therapeutic Exercise: 8-22 mins                     [place] Pager [phone] Office [phone]    Rachel Warren 08/03/2018, 11:25 AM

## 2018-08-13 ENCOUNTER — Ambulatory Visit (INDEPENDENT_AMBULATORY_CARE_PROVIDER_SITE_OTHER): Payer: BC Managed Care – PPO | Admitting: Orthopaedic Surgery

## 2018-08-13 ENCOUNTER — Encounter (INDEPENDENT_AMBULATORY_CARE_PROVIDER_SITE_OTHER): Payer: Self-pay | Admitting: Orthopaedic Surgery

## 2018-08-13 DIAGNOSIS — Z96642 Presence of left artificial hip joint: Secondary | ICD-10-CM

## 2018-08-13 NOTE — Progress Notes (Signed)
The patient is 13 days status post a left total hip arthroplasty.  She is 8 months status post a right total hip arthroplasty.  She had severe disease in both of her hips.  She is ambulate with a walker and doing well overall.  She does wish to proceed outpatient physical therapy once home therapy is done with her.  This will be mainly to work on balance, gait training and coordination as well as just mobility in general.  She feels like she is doing very well.  She is tolerated this point better than her other when she states because she was just ready for this.  On exam she has no leg length discrepancy.  Her left hip incision looks good.  Remove the staples in place Steri-Strips.  She did have just a small seroma of all about 40 cc of fluid which I drained.  We did this mainly because she had a larger seroma on the other side.  I placed Steri-Strips over incision and everything looks good overall.  She will go down to 1 aspirin a day for a week and then can stop her aspirin.  All question concerns were answered and addressed.  We will work on setting her up for outpatient physical therapy.  We will see her back in 4 weeks see how she is doing overall but no x-rays are needed.

## 2018-08-15 ENCOUNTER — Other Ambulatory Visit (INDEPENDENT_AMBULATORY_CARE_PROVIDER_SITE_OTHER): Payer: Self-pay | Admitting: Orthopaedic Surgery

## 2018-08-16 MED ORDER — METHOCARBAMOL 500 MG PO TABS: 500.0000 mg | ORAL_TABLET | Freq: Four times a day (QID) | ORAL | 0 refills | Status: DC | PRN

## 2018-08-16 NOTE — Telephone Encounter (Signed)
Rx request 

## 2018-08-17 ENCOUNTER — Other Ambulatory Visit: Payer: Self-pay | Admitting: Family Medicine

## 2018-08-17 MED ORDER — OXYCODONE HCL 5 MG PO TABS: 5.0000 mg | ORAL_TABLET | ORAL | 0 refills | Status: DC | PRN

## 2018-08-18 NOTE — Telephone Encounter (Signed)
Pt LOV was on 01/14/2018 and last refill for her Cymbalta 30 mg was done on 07/29/2017 for 60 tablets and 2 refills, ok to refill

## 2018-09-10 ENCOUNTER — Telehealth: Payer: Self-pay | Admitting: Obstetrics and Gynecology

## 2018-09-10 ENCOUNTER — Ambulatory Visit (INDEPENDENT_AMBULATORY_CARE_PROVIDER_SITE_OTHER): Payer: BC Managed Care – PPO | Admitting: Obstetrics and Gynecology

## 2018-09-10 ENCOUNTER — Other Ambulatory Visit: Payer: Self-pay | Admitting: Obstetrics and Gynecology

## 2018-09-10 ENCOUNTER — Encounter: Payer: Self-pay | Admitting: Obstetrics and Gynecology

## 2018-09-10 ENCOUNTER — Ambulatory Visit (INDEPENDENT_AMBULATORY_CARE_PROVIDER_SITE_OTHER): Payer: BC Managed Care – PPO

## 2018-09-10 VITALS — BP 120/70 | HR 84 | Resp 14 | Ht 68.0 in | Wt 252.8 lb

## 2018-09-10 DIAGNOSIS — D219 Benign neoplasm of connective and other soft tissue, unspecified: Secondary | ICD-10-CM

## 2018-09-10 DIAGNOSIS — D250 Submucous leiomyoma of uterus: Secondary | ICD-10-CM

## 2018-09-10 DIAGNOSIS — N921 Excessive and frequent menstruation with irregular cycle: Secondary | ICD-10-CM

## 2018-09-10 NOTE — Telephone Encounter (Signed)
Left detailed message, ok per dpr, name identified on voicemail. Advised 1st Lupron received on 07/03/18, next Lupron due on or after 10/02/18. One refill on file at [facility], you will need to contact specialty pharmacy to request refill. Once you authorize shipment to [facility] and delivery scheduled, will schedule nurse visit for injection. Return call to office of any assistance is needed.

## 2018-09-10 NOTE — Telephone Encounter (Signed)
Patient is returning a call to Jill. °

## 2018-09-10 NOTE — Telephone Encounter (Signed)
Spoke with patient, reviewed voicemail message as seen below. Patient aware to return call to office if any additional assistance is needed.

## 2018-09-10 NOTE — Progress Notes (Signed)
GYNECOLOGY  VISIT   HPI: 43 y.o.   Married  [place] American  female   Fisher with Patient's last menstrual period was 08/29/2018.   here for ultrasound follow up of fibroids.    Depo Lupron 11.25 mg given on 07/03/18 in attempt to create amenorrhea and treat dysmenorrhea. Having night sweats.  Still having prolonged menses but definitely lighter than prior to Depo Lupron.  Pad change every 3 - 4 hours.  She has less aching in her legs since receiving Depo Lupron.  Still feels like everything is falling out.   Her prior US showed multiple fibroids with some distortion of the uterine cavity. Her EMB was benign in July 2019.  Had hip surgery in January.  GYNECOLOGIC HISTORY: Patient's last menstrual period was 08/29/2018. Contraception:  none Menopausal hormone therapy:  n/a Last mammogram:  02/17/18 BIRADS 1 negative/density a Last pap smear:   01-22-18 negative, HR HPV negative                               02-08-13 Neg        OB History    Gravida  1   Para  1   Term  0   Preterm  0   AB  0   Living  1     SAB  0   TAB  0   Ectopic  0   Multiple  0   Live Births  0              Patient Active Problem List   Diagnosis Date Noted  . 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 Fibroids 01/22/2018  . 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 Unilateral primary osteoarthritis, left hip 11/06/2017  . Unilateral primary osteoarthritis, right hip 11/06/2017  . Lumbosacral radiculopathy at S1 09/12/2017  . Back pain 08/19/2017  . 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  . Abnormal gait 08/19/2017  . Obesity 08/19/2017  . Ventral hernia s/p laparoscopic repair with mesh 02/27/15 02/27/2015    Past Medical History:  Diagnosis Date  . Anemia   . Arthritis   . Back pain    lower back   . Complication of anesthesia    age 48 had twilight anesthesia and woke up during surgery   . Depression   . Fatty liver 01/22/2016   Noted on MRI  abd  . Fibroids   . Heavy periods   . History of gallstones   . History of thyroid nodule    Left  . Hypertension   . Irregular periods   . Obesity   . Pancreatic lesion   . Pinched nerve    in back  . Ventral hernia 12/26/2014   Midline noted on CT Abd pelvis    Past Surgical History:  Procedure Laterality Date  . benign breast cyst removed Left    age 46  . CHOLECYSTECTOMY    . INSERTION OF MESH N/A 02/27/2015   Procedure: INSERTION OF MESH;  Surgeon: Jackolyn Confer, MD;  Location: [facility];  Service: General;  Laterality: N/A;  . LAPAROSCOPIC ASSISTED VENTRAL HERNIA REPAIR N/A 02/27/2015   Procedure: LAPAROSCOPIC VENTRAL HERNIA REPAIR WITH MESH;  Surgeon: Jackolyn Confer, MD;  Location: [facility];  Service: General;  Laterality: N/A;  . THYROIDECTOMY, PARTIAL    . TOTAL HIP ARTHROPLASTY Right 11/28/2017   Procedure: RIGHT TOTAL HIP ARTHROPLASTY ANTERIOR APPROACH;  Surgeon: Ninfa Linden,  Lind Guest, MD;  Location: [facility];  Service: Orthopedics;  Laterality: Right;  . TOTAL HIP ARTHROPLASTY Left 07/31/2018   Procedure: LEFT TOTAL HIP ARTHROPLASTY ANTERIOR APPROACH;  Surgeon: Mcarthur Rossetti, MD;  Location: [facility];  Service: Orthopedics;  Laterality: Left;  Failed Spinal    Current Outpatient Medications  Medication Sig Dispense Refill  . amLODipine-benazepril (LOTREL) 10-20 MG capsule TAKE 1 CAPSULE BY MOUTH ONCE DAILY 90 capsule 0  . DULoxetine (CYMBALTA) 30 MG capsule TAKE 1 CAPSULE BY MOUTH TWICE DAILY 60 capsule 0  . ferrous sulfate 325 (65 FE) MG tablet Take 1 tablet (325 mg total) by mouth daily with breakfast. 90 tablet 3  . leuprolide (LUPRON DEPOT, 47-MONTH,) 11.25 MG injection Inject 11.25 mg into the muscle every 3 (three) months. 1 each 1  . methocarbamol (ROBAXIN) 500 MG tablet Take 1 tablet (500 mg total) by mouth every 6 (six) hours as needed for muscle spasms. 40 tablet 0   No current facility-administered medications for this visit.      ALLERGIES: Patient  has no known allergies.  Family History  Problem Relation Age of Onset  . Hypertension Father   . Arthritis Father   . Clotting disorder Father   . Prostate cancer Paternal Grandfather     Social History   Socioeconomic History  . Marital status: Married    Spouse name: Not on file  . Number of children: Not on file  . Years of education: Not on file  . Highest education level: Not on file  Occupational History  . Not on file  Social Needs  . Financial resource strain: Not on file  . Food insecurity:    Worry: Not on file    Inability: Not on file  . Transportation needs:    Medical: Not on file    Non-medical: Not on file  Tobacco Use  . Smoking status: Former Smoker    Types: Cigarettes    Last attempt to quit: 06/14/2017    Years since quitting: 1.2  . Smokeless tobacco: Never Used  Substance and Sexual Activity  . Alcohol use: Yes    Comment: occasionally  . Drug use: Yes    Types: Marijuana    Comment: last time 07/08/2018  . Sexual activity: Yes    Birth control/protection: Injection  Lifestyle  . Physical activity:    Days per week: Not on file    Minutes per session: Not on file  . Stress: Not on file  Relationships  . Social connections:    Talks on phone: Not on file    Gets together: Not on file    Attends religious service: Not on file    Active member of club or organization: Not on file    Attends meetings of clubs or organizations: Not on file    Relationship status: Not on file  . Intimate partner violence:    Fear of current or ex partner: Not on file    Emotionally abused: Not on file    Physically abused: Not on file    Forced sexual activity: Not on file  Other Topics Concern  . Not on file  Social History Narrative  . Not on file    Review of Systems  Constitutional: Negative.   HENT: Negative.   Eyes: Negative.   Respiratory: Negative.   Cardiovascular: Negative.   Gastrointestinal: Negative.   Endocrine: Negative.    Genitourinary: Negative.   Musculoskeletal: Negative.   Skin: Negative.   Allergic/Immunologic:  Negative.   Neurological: Negative.   Hematological: Negative.   Psychiatric/Behavioral: Negative.     PHYSICAL EXAMINATION:    BP 120/70 (BP Location: Right Arm, Patient Position: Sitting, Cuff Size: Large)   Pulse 84   Resp 14   Ht 5\' 8"  (1.727 m)   Wt 252 lb 12.8 oz (114.7 kg)   LMP 08/29/2018   BMI 38.44 kg/m     General appearance: alert, cooperative and appears stated age  Pelvic US 8 fibroids, largest 5 cm.  EMS 3.98 mm Normal ovaries.  No free fluid.   ASSESSMENT  Multifibroid uterus.  Some reduction in uterine volume. Status post Depo Lupron x 1.  Hx ventral herniorrhaphy with mesh.  Status post recent hip surgery.  PLAN  We reviewed her fibroids and treatment options.  She will complete one additional Depo Lupron 11.25 mg which is due in early March.  We focused on uterine artery embolization and laparoscopic hysterectomy as the next possible steps in care.  We reviewed risks and benefits of each.  She would like to have consultation with interventional radiology regarding possible uterine artery embolization.  Will move forward with this.  CBC today. FU prn.   An After Visit Summary was printed and given to the patient.  ___25___ minutes face to face time of which over 50% was spent in counseling.

## 2018-09-10 NOTE — Progress Notes (Signed)
Encounter reviewed by Dr. Marcea Rojek Amundson C. Silva.  

## 2018-09-10 NOTE — Telephone Encounter (Signed)
Please assist in scheduling next Depo Lupron 11.25 mg which is due in early March, 2020.

## 2018-09-11 LAB — CBC
HEMOGLOBIN: 12.5 g/dL (ref 11.1–15.9)
Hematocrit: 39.8 % (ref 34.0–46.6)
MCH: 26.6 pg (ref 26.6–33.0)
MCHC: 31.4 g/dL — ABNORMAL LOW (ref 31.5–35.7)
MCV: 85 fL (ref 79–97)
Platelets: 329 10*3/uL (ref 150–450)
RBC: 4.7 x10E6/uL (ref 3.77–5.28)
RDW: 15.2 % (ref 11.7–15.4)
WBC: 6.6 10*3/uL (ref 3.4–10.8)

## 2018-09-14 ENCOUNTER — Ambulatory Visit (INDEPENDENT_AMBULATORY_CARE_PROVIDER_SITE_OTHER): Payer: BC Managed Care – PPO | Admitting: Orthopaedic Surgery

## 2018-09-14 ENCOUNTER — Encounter (INDEPENDENT_AMBULATORY_CARE_PROVIDER_SITE_OTHER): Payer: Self-pay | Admitting: Orthopaedic Surgery

## 2018-09-14 DIAGNOSIS — Z96641 Presence of right artificial hip joint: Secondary | ICD-10-CM

## 2018-09-14 DIAGNOSIS — Z96642 Presence of left artificial hip joint: Secondary | ICD-10-CM

## 2018-09-14 NOTE — Progress Notes (Signed)
The patient is now 6 weeks status post a left total hip arthroplasty.  We actually replaced her right hip in May of last year.  This was due to severe arthritis had only been 43 years old.  She is ambulate with a cane and going through physical therapy.  She is doing well overall.  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  On exam I can put both hips through internal and external rotation with minimal discomfort at all.  She is still using a cane but feels like she will be able to give that up soon.  I want her to use this as comfort allows and as balance allows.  From my standpoint she can return to work next week.  I gave her a note for this.  We do not need to see her back for 6 months.  At that visit I would like a standing low AP pelvis and lateral of both hips.

## 2018-09-16 NOTE — Telephone Encounter (Signed)
Call to [place] at [phone].  Requested prior authorization.  Advised prior authorization that was previously authorized expired 09/12/2018.  Attempted clinical over the phone, advised they will fax authorization as criteria for this drug has changed.

## 2018-09-16 NOTE — Telephone Encounter (Signed)
Returned call to patient. Patient states she spoke with her Specialty Pharmacy and was told she needed to contact her doctors office to get a PA for the medication. RN advised would initiate PA. Patient agreeable.

## 2018-09-16 NOTE — Telephone Encounter (Signed)
Patient left voicemail over lunch returning call to [place] regarding Lupron medication.

## 2018-09-16 NOTE — Telephone Encounter (Signed)
Patient returning call.

## 2018-09-17 ENCOUNTER — Encounter: Payer: Self-pay | Admitting: *Deleted

## 2018-09-17 ENCOUNTER — Ambulatory Visit
Admission: RE | Admit: 2018-09-17 | Discharge: 2018-09-17 | Disposition: A | Discharge status: Home / Self Care | Payer: BC Managed Care – PPO | Source: Ambulatory Visit | Attending: Obstetrics and Gynecology | Admitting: Obstetrics and Gynecology

## 2018-09-17 ENCOUNTER — Other Ambulatory Visit: Payer: Self-pay | Admitting: Obstetrics and Gynecology

## 2018-09-17 DIAGNOSIS — D250 Submucous leiomyoma of uterus: Secondary | ICD-10-CM

## 2018-09-17 HISTORY — PX: IR RADIOLOGIST EVAL & MGMT: IMG5224

## 2018-09-17 NOTE — Telephone Encounter (Signed)
Lupron Prior authorization received from [place]. Completed and faxed to [phone] with Fax confirmation received.

## 2018-09-17 NOTE — Consult Note (Signed)
Chief Complaint: Patient was seen in consultation today for uterine fibroid embolization at the request of Amundson C Silva,Brook E  Referring Physician(s): Dr. Josefa Half  History of Present Illness: Rachel Warren is a 43 y.o. G47P1 African American female with a longstanding history of known uterine fibroids and associated menorrhagia and dysmenorrhea.  She has a history of irregular menstrual cycles every 2 to 4 weeks lasting 7 days with 5 to 6 days of heavy bleeding and passage of clots.  Last menstrual cycle was 08/29/2018.  She is on chronic iron for anemia and did have to have one blood transfusion at the time of hip arthroplasty last May.  She did have a Depo-Lupron injection in December, 2019 which she states had no significant effect on her menorrhagia.  She is due for a another injection later this month.  She experiences bloating during her menstrual cycle but no other significant bulk symptoms.  She has not had prior fibroid surgery.  She has a history of chlamydia at age 59.  Pap smear on 01/22/2018 was negative for malignancy.  Ultrasound on 09/10/2018 demonstrates uterine volume of approximately 574 mL with at least 8 measurable uterine fibroids.  The largest measures approximately 5.4 cm in greatest diameter and is located in the superior fundus.  The ovaries appeared unremarkable by ultrasound.  Past Medical History:  Diagnosis Date  . Anemia   . Arthritis   . Back pain    lower back   . Complication of anesthesia    age 54 had twilight anesthesia and woke up during surgery   . Depression   . Fatty liver 01/22/2016   Noted on MRI abd  . Fibroids   . Heavy periods   . History of gallstones   . History of thyroid nodule    Left  . Hypertension   . Irregular periods   . Obesity   . Pancreatic lesion   . Pinched nerve    in back  . Ventral hernia 12/26/2014   Midline noted on CT Abd pelvis    Past Surgical History:  Procedure Laterality Date  . benign  breast cyst removed Left    age 22  . CHOLECYSTECTOMY    . INSERTION OF MESH N/A 02/27/2015   Procedure: INSERTION OF MESH;  Surgeon: Jackolyn Confer, MD;  Location: [facility];  Service: General;  Laterality: N/A;  . LAPAROSCOPIC ASSISTED VENTRAL HERNIA REPAIR N/A 02/27/2015   Procedure: LAPAROSCOPIC VENTRAL HERNIA REPAIR WITH MESH;  Surgeon: Jackolyn Confer, MD;  Location: [facility];  Service: General;  Laterality: N/A;  . THYROIDECTOMY, PARTIAL    . TOTAL HIP ARTHROPLASTY Right 11/28/2017   Procedure: RIGHT TOTAL HIP ARTHROPLASTY ANTERIOR APPROACH;  Surgeon: Mcarthur Rossetti, MD;  Location: [facility];  Service: Orthopedics;  Laterality: Right;  . TOTAL HIP ARTHROPLASTY Left 07/31/2018   Procedure: LEFT TOTAL HIP ARTHROPLASTY ANTERIOR APPROACH;  Surgeon: Mcarthur Rossetti, MD;  Location: [facility];  Service: Orthopedics;  Laterality: Left;  Failed Spinal    Allergies: Patient has no known allergies.  Medications: Prior to Admission medications   Medication Sig Start Date End Date Taking? Authorizing Provider  amLODipine-benazepril (LOTREL) 10-20 MG capsule TAKE 1 CAPSULE BY MOUTH ONCE DAILY 08/18/18   Billie Ruddy, MD  DULoxetine (CYMBALTA) 30 MG capsule TAKE 1 CAPSULE BY MOUTH TWICE DAILY 08/18/18   Billie Ruddy, MD  ferrous sulfate 325 (65 FE) MG tablet Take 1 tablet (325 mg total) by mouth daily with  breakfast. 10/29/17   Billie Ruddy, MD  leuprolide (LUPRON DEPOT, 19-MONTH,) 11.25 MG injection Inject 11.25 mg into the muscle every 3 (three) months. 06/16/18   Nunzio Cobbs, MD  methocarbamol (ROBAXIN) 500 MG tablet Take 1 tablet (500 mg total) by mouth every 6 (six) hours as needed for muscle spasms. 08/16/18   Mcarthur Rossetti, MD     Family History  Problem Relation Age of Onset  . Hypertension Father   . Arthritis Father   . Clotting disorder Father   . Prostate cancer Paternal Grandfather     Social History   Socioeconomic History  . Marital status:  Married    Spouse name: Not on file  . Number of children: Not on file  . Years of education: Not on file  . Highest education level: Not on file  Occupational History  . Not on file  Social Needs  . Financial resource strain: Not on file  . Food insecurity:    Worry: Not on file    Inability: Not on file  . Transportation needs:    Medical: Not on file    Non-medical: Not on file  Tobacco Use  . Smoking status: Former Smoker    Types: Cigarettes    Last attempt to quit: 06/14/2017    Years since quitting: 1.2  . Smokeless tobacco: Never Used  Substance and Sexual Activity  . Alcohol use: Yes    Comment: occasionally  . Drug use: Yes    Types: Marijuana    Comment: last time 07/08/2018  . Sexual activity: Yes    Birth control/protection: Injection  Lifestyle  . Physical activity:    Days per week: Not on file    Minutes per session: Not on file  . Stress: Not on file  Relationships  . Social connections:    Talks on phone: Not on file    Gets together: Not on file    Attends religious service: Not on file    Active member of club or organization: Not on file    Attends meetings of clubs or organizations: Not on file    Relationship status: Not on file  Other Topics Concern  . Not on file  Social History Narrative  . Not on file    Review of Systems: A 12 point ROS discussed and pertinent positives are indicated in the HPI above.  All other systems are negative.  Review of Systems  Constitutional: Negative.   HENT: Negative.   Respiratory: Negative.   Cardiovascular: Negative.   Gastrointestinal: Negative.   Genitourinary: Positive for menstrual problem and pelvic pain. Negative for difficulty urinating, dysuria, flank pain and hematuria.  Musculoskeletal: Negative.   Neurological: Negative.     Vital Signs: BP (!) 148/101   Pulse 82   Temp 98.4 F (36.9 C) (Oral)   Resp (!) 82   Ht _0  (1.753 m)   Wt 115.2 kg   LMP 08/29/2018   SpO2 97%   BMI  37.51 kg/m   Physical Exam Vitals signs reviewed.  Constitutional:      General: She is not in acute distress.    Appearance: Normal appearance. She is obese. She is not ill-appearing, toxic-appearing or diaphoretic.  HENT:     Head: Normocephalic and atraumatic.     Mouth/Throat:     Mouth: Mucous membranes are moist.     Pharynx: No oropharyngeal exudate or posterior oropharyngeal erythema.  Neck:     Musculoskeletal: Neck  supple. No muscular tenderness.  Cardiovascular:     Rate and Rhythm: Normal rate and regular rhythm.     Pulses: Normal pulses.     Heart sounds: Normal heart sounds. No murmur. No friction rub. No gallop.   Pulmonary:     Effort: Pulmonary effort is normal. No respiratory distress.     Breath sounds: Normal breath sounds. No stridor. No wheezing, rhonchi or rales.  Abdominal:     General: There is no distension.     Palpations: Abdomen is soft. There is no mass.     Tenderness: There is no abdominal tenderness. There is no guarding or rebound.     Hernia: No hernia is present.  Musculoskeletal:        General: No swelling.  Lymphadenopathy:     Cervical: No cervical adenopathy.  Skin:    General: Skin is warm and dry.  Neurological:     Mental Status: She is alert and oriented to person, place, and time.      Imaging: US Transvaginal Non-ob  Result Date: 09/10/2018 SEE PROGRESS NOTE   Labs:  CBC: Recent Labs    06/04/18 1605 07/27/18 1459 08/01/18 0329 09/10/18 1014  WBC 7.2 6.8 7.7 6.6  HGB 10.9* 12.6 11.0* 12.5  HCT 35.3 41.0 35.9* 39.8  PLT 323 279 223 329    COAGS: No results for input(s): INR, APTT in the last 8760 hours.  BMP: Recent Labs    11/20/17 1011 11/29/17 0517 01/14/18 0941 07/27/18 1459 08/01/18 0329  NA 139 138 142 141 138  K 3.4* 3.5 3.8 3.5 3.8  CL 104 105 104 106 103  CO2 _0 GLUCOSE 93 114* 92 86 129*  BUN 8 7 5* 18 8  CALCIUM 8.8* 8.4* 9.1 9.1 8.1*  CREATININE 0.70 0.60 0.69 0.98  0.72  GFRNONAA >60 >60  --  >60 >60  GFRAA >60 >60  --  >60 >60    LIVER FUNCTION TESTS: Recent Labs    10/24/17 1630  BILITOT 0.3  AST 16  ALT 15  PROT 6.2    Assessment and Plan:  I met with Mrs. Frumkin.  We reviewed ultrasound findings and treatment options for symptomatic uterine fibroid disease including hysterectomy and uterine fibroid embolization.  She clearly has significant menorrhagia and has had need for 1 prior blood transfusion.  There is an indication for treatment of her fibroid disease.  She has a 82 year old daughter and does not desire to have any more children.  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  I did outline the pros and cons of embolization versus hysterectomy. Hysterectomy would be more definitive and eliminate menstrual cycles.  I went over details of uterine fibroid embolization with Mrs. Jurek.  I told her that an MRI of the pelvis would be needed prior to embolization to evaluate fibroid morphology and enhancement pattern in determining if she is a candidate for the procedure.  We will proceed with scheduling an outpatient MRI and I will get back in touch with her regarding MRI findings.  If she remains interested in fibroid embolization, we will start the authorization and scheduling process.  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  Thank you for this interesting consult.  I greatly enjoyed meeting [name] and look forward to participating  in their care.  A copy of this report was sent to the requesting provider on this date.  Electronically Signed: Azzie Roup 09/17/2018, 2:48 PM     I spent a total of 40 Minutes in face to face in clinical consultation, greater than 50% of which was counseling/coordinating care for symptomatic uterine fibroids.

## 2018-09-21 NOTE — Telephone Encounter (Signed)
Called [phone] to check status.  Spoke with rep Precious.  Approval processed: Approved through 12/18/2018. [facility] order for next dose [phone]. Long hold time. Will call back.

## 2018-09-22 NOTE — Telephone Encounter (Signed)
[facility] to [facility] [phone] The patient needs to call to authorize shipment.  Call to patient.  [facility] will [facility] and request shipment.  Advised to call back if any problems with shipping/pharmacy.  Lupron nurse appointment scheduled for 10/05/2018.  Encounter to Dr. Quincy Simmonds and will close.

## 2018-09-25 ENCOUNTER — Other Ambulatory Visit: Payer: Self-pay | Admitting: Family Medicine

## 2018-09-25 ENCOUNTER — Other Ambulatory Visit: Payer: Self-pay | Admitting: Obstetrics and Gynecology

## 2018-09-25 DIAGNOSIS — D219 Benign neoplasm of connective and other soft tissue, unspecified: Secondary | ICD-10-CM

## 2018-09-25 NOTE — Telephone Encounter (Signed)
Advised pt to schedule a Med refill appointment for her Lyrica, pt scheduled for 09/30/2018 at 4 pm

## 2018-09-28 ENCOUNTER — Other Ambulatory Visit: Payer: Self-pay

## 2018-09-28 ENCOUNTER — Ambulatory Visit (HOSPITAL_COMMUNITY)
Admission: RE | Admit: 2018-09-28 | Discharge: 2018-09-28 | Disposition: A | Discharge status: Home / Self Care | Payer: BC Managed Care – PPO | Source: Ambulatory Visit | Attending: Obstetrics and Gynecology | Admitting: Obstetrics and Gynecology

## 2018-09-28 DIAGNOSIS — D250 Submucous leiomyoma of uterus: Secondary | ICD-10-CM | POA: Insufficient documentation

## 2018-09-28 LAB — POCT I-STAT CREATININE: CREATININE: 0.9 mg/dL (ref 0.44–1.00)

## 2018-09-28 MED ORDER — GADOBUTROL 1 MMOL/ML IV SOLN
10.0000 mL | Freq: Once | INTRAVENOUS | Status: AC | PRN
  Administered 2018-09-28: 10 mL via INTRAVENOUS

## 2018-09-28 NOTE — Telephone Encounter (Signed)
Medication refill request: Depo-Lupron Last AEX:  01/22/18 BS Next OV: 10/05/18  Last MMG (if hormonal medication request): 02/17/18 BIRADS 1 negative/density a Refill authorized: please advise on refill.

## 2018-09-30 ENCOUNTER — Ambulatory Visit (INDEPENDENT_AMBULATORY_CARE_PROVIDER_SITE_OTHER): Payer: BC Managed Care – PPO | Admitting: Family Medicine

## 2018-09-30 ENCOUNTER — Other Ambulatory Visit: Payer: Self-pay

## 2018-09-30 ENCOUNTER — Encounter: Payer: Self-pay | Admitting: Family Medicine

## 2018-09-30 VITALS — BP 122/78 | HR 78 | Temp 98.1°F | Wt 248.0 lb

## 2018-09-30 DIAGNOSIS — I10 Essential (primary) hypertension: Secondary | ICD-10-CM | POA: Diagnosis not present

## 2018-09-30 DIAGNOSIS — G8929 Other chronic pain: Secondary | ICD-10-CM | POA: Diagnosis not present

## 2018-09-30 MED ORDER — DULOXETINE HCL 30 MG PO CPEP: 30.0000 mg | ORAL_CAPSULE | Freq: Two times a day (BID) | ORAL | 2 refills | Status: DC

## 2018-09-30 NOTE — Progress Notes (Signed)
Subjective:    Patient ID: Rachel Warren, female    DOB: 05-03-1976, 43 y.o.   MRN: [id number]  No chief complaint on file.   HPI Patient was seen today for follow-up and medication refill.  Since last OFV pt underwent b/l THR and has been doing well.  Still in PT.  Pt has lost weight.  Pt still taking cymbalta 30 mg BID.  States is interested in decreasing the dose of Cymbalta, but is not sure if she should wait until the summer.  Pt states she is doing well overall but does endorse increased stress from having to work from home and dealing with her husband who refuses to move out.    Past Medical History:  Diagnosis Date  . Anemia   . Arthritis   . Back pain    lower back   . Complication of anesthesia    age 83 had twilight anesthesia and woke up during surgery   . Depression   . Fatty liver 01/22/2016   Noted on MRI abd  . Fibroids   . Heavy periods   . History of gallstones   . History of thyroid nodule    Left  . Hypertension   . Irregular periods   . Obesity   . Pancreatic lesion   . Pinched nerve    in back  . Ventral hernia 12/26/2014   Midline noted on CT Abd pelvis    No Known Allergies  ROS General: Denies fever, chills, night sweats, changes in weight, changes in appetite HEENT: Denies headaches, ear pain, changes in vision, rhinorrhea, sore throat CV: Denies CP, palpitations, SOB, orthopnea Pulm: Denies SOB, cough, wheezing GI: Denies abdominal pain, nausea, vomiting, diarrhea, constipation GU: Denies dysuria, hematuria, frequency, vaginal discharge Msk: Denies muscle cramps, joint pains Neuro: Denies weakness, numbness, tingling Skin: Denies rashes, bruising Psych: Denies depression, anxiety, hallucinations    Objective:    Blood pressure 122/78, pulse 78, temperature 98.1 F (36.7 C), temperature source Oral, weight 248 lb (112.5 kg), SpO2 98 %.  Gen. Pleasant, well-nourished, in no distress, normal affect   HEENT: [place]/AT, face symmetric,  no scleral icterus, PERRLA, nares patent without drainage Lungs: no accessory muscle use, CTAB, no wheezes or rales Cardiovascular: RRR, no m/r/g, no peripheral edema Musculoskeletal: No deformities, no cyanosis or clubbing, normal tone Neuro:  A&Ox3, CN II-XII intact, ambulating with a cane Skin:  Warm, no lesions/ rash  Wt Readings from Last 3 Encounters:  09/30/18 248 lb (112.5 kg)  09/17/18 254 lb (115.2 kg)  09/10/18 252 lb 12.8 oz (114.7 kg)    Lab Results  Component Value Date   WBC 6.6 09/10/2018   HGB 12.5 09/10/2018   HCT 39.8 09/10/2018   PLT 329 09/10/2018   GLUCOSE 129 (H) 08/01/2018   CHOL 175 01/14/2018   TRIG 164.0 (H) 01/14/2018   HDL 47.50 01/14/2018   LDLCALC 95 01/14/2018   ALT 15 10/24/2017   AST 16 10/24/2017   NA 138 08/01/2018   K 3.8 08/01/2018   CL 103 08/01/2018   CREATININE 0.90 09/28/2018   BUN 8 08/01/2018   CO2 26 08/01/2018   TSH 1.04 01/14/2018   INR 1.00 02/22/2015   HGBA1C 4.8 01/14/2018    Assessment/Plan:  Other chronic pain  -improving s/p b/l TKR -continue PT -discussed weaning off cymbalta.  Pt wishes to wait a few more months prior to starting this. - Plan: DULoxetine (CYMBALTA) 30 MG capsule  Essential hypertension -controlled -continue norvasc-benazepril 10-20  mg daily -continue lifestyle modifications -pt encouraged to continue exercising/PT  F/u prn   Grier Mitts, MD

## 2018-10-05 ENCOUNTER — Other Ambulatory Visit: Payer: Self-pay

## 2018-10-05 ENCOUNTER — Telehealth: Payer: Self-pay | Admitting: Emergency Medicine

## 2018-10-05 ENCOUNTER — Ambulatory Visit (INDEPENDENT_AMBULATORY_CARE_PROVIDER_SITE_OTHER): Payer: BC Managed Care – PPO

## 2018-10-05 VITALS — BP 118/82 | HR 70 | Temp 98.4°F | Resp 16 | Wt 253.0 lb

## 2018-10-05 DIAGNOSIS — N921 Excessive and frequent menstruation with irregular cycle: Secondary | ICD-10-CM

## 2018-10-05 LAB — POCT URINE PREGNANCY: PREG TEST UR: NEGATIVE

## 2018-10-05 MED ORDER — LEUPROLIDE ACETATE (3 MONTH) 11.25 MG IM KIT: 11.2500 mg | PACK | Freq: Once | INTRAMUSCULAR | Status: DC

## 2018-10-05 MED ORDER — LEUPROLIDE ACETATE 3.75 MG IM KIT: 3.7500 mg | PACK | Freq: Once | INTRAMUSCULAR | Status: DC

## 2018-10-05 MED ORDER — LEUPROLIDE ACETATE (3 MONTH) 11.25 MG IM KIT
11.2500 mg | PACK | Freq: Once | INTRAMUSCULAR | Status: AC
  Administered 2018-10-05: 11.25 mg via INTRAMUSCULAR

## 2018-10-05 NOTE — Telephone Encounter (Signed)
-----   Message from Nunzio Cobbs, MD sent at 10/02/2018  7:03 AM EDT ----- Please inform patient of her pelvic MRI results.  This was done to check her fibroids for potential uterine artery embolization procedure.  It shows multiple fibroids, he largest being about 5 cm.  Her ovaries are normal. The radiologist will review this and determine if she is a good candidate for the procedure, which I understand she is considering for this summer. I recommend she proceed with the second Depo Lupron injection next week in our office.

## 2018-10-05 NOTE — Progress Notes (Signed)
43 y.o. Married [place] American female here for her 2nd Depo Lupron injection.  Indication for injection:  Prolonged menstruation  LMP:  09-04-2018  Contraception:  none  UPT-neg  Lupron order:  11.25mg  IM Given right GM Patient states due to coronavirus precautions statewide, her surgery has been postponed.  Order to administer given by Dr. Quincy Simmonds.

## 2018-10-19 ENCOUNTER — Telehealth: Payer: Self-pay | Admitting: Obstetrics and Gynecology

## 2018-10-19 ENCOUNTER — Encounter (INDEPENDENT_AMBULATORY_CARE_PROVIDER_SITE_OTHER): Payer: Self-pay | Admitting: Orthopaedic Surgery

## 2018-10-19 ENCOUNTER — Encounter: Payer: Self-pay | Admitting: Obstetrics and Gynecology

## 2018-10-19 NOTE — Telephone Encounter (Signed)
I recommend a warm compress on the injection site, but I do think it needs evaluation.  I am happy for her to come to the office for me to check it.  She will still need to see ortho regarding her hip pain.

## 2018-10-19 NOTE — Telephone Encounter (Signed)
Spoke with patient. Advised as seen below per Dr. Quincy Simmonds. Patient states she is scheduled for OV with ortho 4/7 at 9am. Patient declines OV at this time, states she would like to try warm compresses first, if symptoms do not resolve, will return call to office to schedule OV.   Routing to provider for final review. Patient is agreeable to disposition. Will close encounter.

## 2018-10-19 NOTE — Telephone Encounter (Signed)
Since my shot afew ago my leg and backside has been aching and theinjection site hard pinkish and tender

## 2018-10-19 NOTE — Telephone Encounter (Signed)
Spoke with patient. Patient received Depo-lupron injection in right GM on 10/05/18. Unable to visualize injection site, reports hard lump at injection site, red and tender to touch. Noticed while showering last wk. Has chronic hip pain, has placed a call to orthopedic, is awaiting return call to schedule OV. Has not been ambulating as much during Covid-19 restrictions, is experiencing increased pain in right hip. Denies fever/chills or drainage from injection site. Advised patient not uncommon to have lump at injection site, this should resolve. Recommended applying warm compress. If redness increasing or new symptoms develop, return call to office to schedule OV for further evaluation. Advised to keep OV with orthopedic for further evaluation. Dr. Quincy Simmonds will review, our office will return call if any additional recommendations.   Dr. Quincy Simmonds -please review, any additional recommendations?

## 2018-10-20 ENCOUNTER — Ambulatory Visit (INDEPENDENT_AMBULATORY_CARE_PROVIDER_SITE_OTHER): Payer: BC Managed Care – PPO | Admitting: Orthopaedic Surgery

## 2018-10-20 ENCOUNTER — Ambulatory Visit (INDEPENDENT_AMBULATORY_CARE_PROVIDER_SITE_OTHER): Payer: BC Managed Care – PPO

## 2018-10-20 ENCOUNTER — Other Ambulatory Visit: Payer: Self-pay

## 2018-10-20 ENCOUNTER — Encounter (INDEPENDENT_AMBULATORY_CARE_PROVIDER_SITE_OTHER): Payer: Self-pay | Admitting: Orthopaedic Surgery

## 2018-10-20 DIAGNOSIS — M5431 Sciatica, right side: Secondary | ICD-10-CM

## 2018-10-20 DIAGNOSIS — Z96641 Presence of right artificial hip joint: Secondary | ICD-10-CM

## 2018-10-20 DIAGNOSIS — M7061 Trochanteric bursitis, right hip: Secondary | ICD-10-CM

## 2018-10-20 DIAGNOSIS — Z96642 Presence of left artificial hip joint: Secondary | ICD-10-CM

## 2018-10-20 MED ORDER — METHYLPREDNISOLONE ACETATE 40 MG/ML IJ SUSP
40.0000 mg | INTRAMUSCULAR | Status: AC | PRN
  Administered 2018-10-20: 10:00:00 40 mg via INTRA_ARTICULAR

## 2018-10-20 MED ORDER — LIDOCAINE HCL 1 % IJ SOLN
3.0000 mL | INTRAMUSCULAR | Status: AC | PRN
  Administered 2018-10-20: 10:00:00 3 mL

## 2018-10-20 MED ORDER — CYCLOBENZAPRINE HCL 10 MG PO TABS: 10.0000 mg | ORAL_TABLET | Freq: Every day | ORAL | 1 refills | Status: DC

## 2018-10-20 NOTE — Progress Notes (Signed)
Office Visit Note   Patient: Rachel Warren           Date of Birth: 1976-03-15           MRN: [id number] Visit Date: 10/20/2018              Requested by: Billie Ruddy, MD [place], [place] [address] PCP: Billie Ruddy, MD   Assessment & Plan: Visit Diagnoses:  1. Sciatica, right side   2. Status post total replacement of left hip   3. Status post total replacement of right hip   4. Trochanteric bursitis, right hip     Plan: We will send her to physical therapy for IT band stretching, hamstring stretching, core strengthening, modalities and home exercise program.  She is shown an IT band stretch and had her demonstrate it back to me today which she can perform on her own at home.  Have her apply moist heat low back.  She will begin taking Aleve 2 tablets twice daily with food.  She is given Flexeril to take at night.  Follow-up with [place] in 1 month to check her progress.  Follow-Up Instructions: Return in about 4 weeks (around 11/17/2018).   Orders:  Orders Placed This Encounter  Procedures  . Large Joint Inj: R greater trochanter  . XR Lumbar Spine 2-3 Views   Meds ordered this encounter  Medications  . cyclobenzaprine (FLEXERIL) 10 MG tablet    Sig: Take 1 tablet (10 mg total) by mouth at bedtime.    Dispense:  30 tablet    Refill:  1      Procedures: Large Joint Inj: R greater trochanter on 10/20/2018 10:12 AM Indications: pain Details: 22 G 1.5 in needle, lateral approach  Arthrogram: No  Medications: 3 mL lidocaine 1 %; 40 mg methylPREDNISolone acetate 40 MG/ML Outcome: tolerated well, no immediate complications Procedure, treatment alternatives, risks and benefits explained, specific risks discussed. Consent was given by the patient. Immediately prior to procedure a time out was called to verify the correct patient, procedure, equipment, support staff and site/side marked as required. Patient was prepped and draped in the usual  sterile fashion.       Clinical Data: No additional findings.   Subjective: Chief Complaint  Patient presents with  . Right Leg - Pain    HPI Rachel Warren returns today due to right hip and leg pain.  States is been ongoing the last couple weeks.  No known injury.  She did have a Lupron injection in the right hip and thought that that initially was the cause of the pain was having some muscle aching sensation about the right hip.  However she is now developed numbness tingling that goes down the leg occasionally.  She notes that she lays on the right hip at night that it awakens her.  She denies any saddle anesthesia.  Denies any bowel bladder dysfunction.  She has had no fevers chills.  Status post left total hip arthroplasty 07/31/2018 and right total hip arthroplasty 11/28/2017.  Review of Systems Please see HPI  Objective: Vital Signs: There were no vitals taken for this visit.  Physical Exam Constitutional:      Appearance: She is not ill-appearing or diaphoretic.  Cardiovascular:     Pulses: Normal pulses.  Pulmonary:     Effort: Pulmonary effort is normal.  Neurological:     Mental Status: She is alert and oriented to person, place, and time.  Ortho Exam Bilateral hips excellent range of motion without pain.  Tenderness over the trochanteric region right greater than left.  Bilateral 5 out 5  strength throughout lower extremities against resistance.  Negative straight leg raise bilaterally.  Deep tendon reflexes are 2+ at the knees and ankles and equal and symmetric.  Tight hamstrings bilaterally.  Comes within 3 inches of being able to touch toes.  Full extension of lumbar spine without pain.  Specialty Comments:  No specialty comments available.  Imaging: Xr Lumbar Spine 2-3 Views  Result Date: 10/20/2018 Lumbar spine AP and lateral views: Disc space overall well-maintained.  Normal lordotic curvature.  No acute fractures.  Facet changes at L4-5.  AP view shows a  bilateral total hip arthroplasties without any obvious complications or hardware failure.    PMFS History: Patient Active Problem List   Diagnosis Date Noted  . 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 Fibroids 01/22/2018  . 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 Unilateral primary osteoarthritis, left hip 11/06/2017  . Unilateral primary osteoarthritis, right hip 11/06/2017  . Lumbosacral radiculopathy at S1 09/12/2017  . Back pain 08/19/2017  . 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  . Abnormal gait 08/19/2017  . Obesity 08/19/2017  . Ventral hernia s/p laparoscopic repair with mesh 02/27/15 02/27/2015   Past Medical History:  Diagnosis Date  . Anemia   . Arthritis   . Back pain    lower back   . Complication of anesthesia    age 71 had twilight anesthesia and woke up during surgery   . Depression   . Fatty liver 01/22/2016   Noted on MRI abd  . Fibroids   . Heavy periods   . History of gallstones   . History of thyroid nodule    Left  . Hypertension   . Irregular periods   . Obesity   . Pancreatic lesion   . Pinched nerve    in back  . Ventral hernia 12/26/2014   Midline noted on CT Abd pelvis    Family History  Problem Relation Age of Onset  . Hypertension Father   . Arthritis Father   . Clotting disorder Father   . Prostate cancer Paternal Grandfather     Past Surgical History:  Procedure Laterality Date  . benign breast cyst removed Left    age 65  . CHOLECYSTECTOMY    . INSERTION OF MESH N/A 02/27/2015   Procedure: INSERTION OF MESH;  Surgeon: Jackolyn Confer, MD;  Location: [facility];  Service: General;  Laterality: N/A;  . IR RADIOLOGIST EVAL & MGMT  09/17/2018  . LAPAROSCOPIC ASSISTED VENTRAL HERNIA REPAIR N/A 02/27/2015   Procedure: LAPAROSCOPIC VENTRAL HERNIA REPAIR WITH MESH;  Surgeon: Jackolyn Confer, MD;  Location: [facility];  Service: General;  Laterality: N/A;  . THYROIDECTOMY, PARTIAL    . TOTAL HIP ARTHROPLASTY Right  11/28/2017   Procedure: RIGHT TOTAL HIP ARTHROPLASTY ANTERIOR APPROACH;  Surgeon: Mcarthur Rossetti, MD;  Location: [facility];  Service: Orthopedics;  Laterality: Right;  . TOTAL HIP ARTHROPLASTY Left 07/31/2018   Procedure: LEFT TOTAL HIP ARTHROPLASTY ANTERIOR APPROACH;  Surgeon: Mcarthur Rossetti, MD;  Location: [facility];  Service: Orthopedics;  Laterality: Left;  Failed Spinal   Social History   Occupational History  . Not on file  Tobacco Use  . Smoking status: Former Smoker    Types: Cigarettes    Last attempt to quit: 06/14/2017    Years since quitting: 1.3  .  Smokeless tobacco: Never Used  Substance and Sexual Activity  . Alcohol use: Yes    Comment: occasionally  . Drug use: Yes    Types: Marijuana    Comment: last time 07/08/2018  . Sexual activity: Yes    Birth control/protection: Injection

## 2018-10-22 ENCOUNTER — Encounter: Payer: Self-pay | Admitting: Obstetrics and Gynecology

## 2018-11-19 ENCOUNTER — Ambulatory Visit (INDEPENDENT_AMBULATORY_CARE_PROVIDER_SITE_OTHER): Payer: BC Managed Care – PPO | Admitting: Orthopaedic Surgery

## 2018-11-19 ENCOUNTER — Other Ambulatory Visit: Payer: Self-pay

## 2018-11-19 ENCOUNTER — Encounter: Payer: Self-pay | Admitting: Orthopaedic Surgery

## 2018-11-19 DIAGNOSIS — Z96642 Presence of left artificial hip joint: Secondary | ICD-10-CM | POA: Diagnosis not present

## 2018-11-19 DIAGNOSIS — M7061 Trochanteric bursitis, right hip: Secondary | ICD-10-CM

## 2018-11-19 DIAGNOSIS — Z96641 Presence of right artificial hip joint: Secondary | ICD-10-CM

## 2018-11-19 MED ORDER — DICLOFENAC SODIUM 1 % TD GEL: 2.0000 g | Freq: Four times a day (QID) | TRANSDERMAL | 3 refills | Status: AC

## 2018-11-19 NOTE — Progress Notes (Signed)
The patient is return for follow-up due to significant right hip trochanteric bursitis.  She has a history of both her hips being replaced with the right one done 12 months ago and left one done earlier this year.  She is only 43 years old.  She is very pleased with her hip replacements in terms of getting back to the activities that she was not able to do before.  She has been in some physical therapy now for her trochanteric bursitis.  She does sleep on her right side and that is her favorite side to sleep on and off counseled her about try to avoid sleeping on that side.  She is walking without any significant limp and does not use an assistive device at this point.  We did place a steroid injection around the trochanteric area on the right side a month ago when she says that did not really make much of a difference.  On exam her right hip moves smoothly and fluidly on physical exam with no pain in the groin at all.  She does have pain to palpation of the trochanteric area on the right side and IT band.  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  It may take a while to get over the bursitis.  We can always inject this area again in 3 months from now.  I would like to see her back in 3 months though and at that visit would like a standing low AP pelvis and a lateral both hips.  All questions concerns were answered and addressed.

## 2018-11-23 ENCOUNTER — Encounter: Payer: Self-pay | Admitting: Orthopaedic Surgery

## 2018-11-24 ENCOUNTER — Other Ambulatory Visit: Payer: Self-pay | Admitting: Orthopaedic Surgery

## 2018-11-24 MED ORDER — TIZANIDINE HCL 4 MG PO TABS: 4.0000 mg | ORAL_TABLET | Freq: Three times a day (TID) | ORAL | 1 refills | Status: DC | PRN

## 2018-11-26 ENCOUNTER — Other Ambulatory Visit: Payer: Self-pay | Admitting: Family Medicine

## 2018-12-01 ENCOUNTER — Telehealth: Payer: Self-pay | Admitting: *Deleted

## 2018-12-01 NOTE — Telephone Encounter (Signed)
Follow-up call to patient regarding [place]. Advised contacted [place] at [facility]. IR and they are working to contact patients and resuming elective procedures following Covid 19.  Call to patient. Update provided. Patient is ready to schedule procedure and she may also call office as well to follow-up.  Routing to Dr Quincy Simmonds. Encounter closed.

## 2018-12-08 ENCOUNTER — Other Ambulatory Visit: Payer: Self-pay | Admitting: Obstetrics and Gynecology

## 2018-12-09 NOTE — Telephone Encounter (Signed)
Routed to triage 

## 2018-12-09 NOTE — Telephone Encounter (Signed)
Refill request received for Depo-Lupron 11.25 mg IM once.   2nd Depo-lupron received 10/05/18. Per review of Epic, patient is planning to proceed with [place].   Routing to Dr. Quincy Simmonds to review and advise.

## 2018-12-15 ENCOUNTER — Other Ambulatory Visit (HOSPITAL_COMMUNITY): Payer: Self-pay | Admitting: Interventional Radiology

## 2018-12-15 DIAGNOSIS — D250 Submucous leiomyoma of uterus: Secondary | ICD-10-CM

## 2018-12-21 ENCOUNTER — Other Ambulatory Visit (HOSPITAL_COMMUNITY)
Admission: RE | Admit: 2018-12-21 | Discharge: 2018-12-21 | Disposition: A | Discharge status: Home / Self Care | Payer: BC Managed Care – PPO | Source: Ambulatory Visit | Attending: Interventional Radiology | Admitting: Interventional Radiology

## 2018-12-21 ENCOUNTER — Other Ambulatory Visit: Payer: Self-pay | Admitting: Radiology

## 2018-12-21 ENCOUNTER — Other Ambulatory Visit: Payer: Self-pay

## 2018-12-21 DIAGNOSIS — Z1159 Encounter for screening for other viral diseases: Secondary | ICD-10-CM | POA: Insufficient documentation

## 2018-12-21 LAB — SARS CORONAVIRUS 2 BY RT PCR (HOSPITAL ORDER, PERFORMED IN ~~LOC~~ HOSPITAL LAB): SARS Coronavirus 2: NEGATIVE

## 2018-12-22 ENCOUNTER — Encounter (HOSPITAL_COMMUNITY): Payer: Self-pay

## 2018-12-22 ENCOUNTER — Observation Stay (HOSPITAL_COMMUNITY)
Admission: RE | Admit: 2018-12-22 | Discharge: 2018-12-23 | Disposition: A | Discharge status: Home / Self Care | Payer: BC Managed Care – PPO | Source: Ambulatory Visit | Attending: Interventional Radiology | Admitting: Interventional Radiology

## 2018-12-22 ENCOUNTER — Ambulatory Visit (HOSPITAL_COMMUNITY)
Admission: RE | Admit: 2018-12-22 | Discharge: 2018-12-22 | Disposition: A | Discharge status: Home / Self Care | Payer: BC Managed Care – PPO | Source: Ambulatory Visit | Attending: Interventional Radiology | Admitting: Interventional Radiology

## 2018-12-22 VITALS — BP 124/101 | HR 61 | Temp 97.9°F | Resp 16

## 2018-12-22 DIAGNOSIS — Z79899 Other long term (current) drug therapy: Secondary | ICD-10-CM | POA: Insufficient documentation

## 2018-12-22 DIAGNOSIS — D259 Leiomyoma of uterus, unspecified: Principal | ICD-10-CM | POA: Insufficient documentation

## 2018-12-22 DIAGNOSIS — D649 Anemia, unspecified: Secondary | ICD-10-CM | POA: Diagnosis not present

## 2018-12-22 DIAGNOSIS — F329 Major depressive disorder, single episode, unspecified: Secondary | ICD-10-CM | POA: Diagnosis not present

## 2018-12-22 DIAGNOSIS — I10 Essential (primary) hypertension: Secondary | ICD-10-CM | POA: Insufficient documentation

## 2018-12-22 DIAGNOSIS — Z791 Long term (current) use of non-steroidal anti-inflammatories (NSAID): Secondary | ICD-10-CM | POA: Diagnosis not present

## 2018-12-22 DIAGNOSIS — D219 Benign neoplasm of connective and other soft tissue, unspecified: Secondary | ICD-10-CM

## 2018-12-22 DIAGNOSIS — D250 Submucous leiomyoma of uterus: Secondary | ICD-10-CM

## 2018-12-22 HISTORY — PX: IR US GUIDE VASC ACCESS RIGHT: IMG2390

## 2018-12-22 HISTORY — PX: IR ANGIOGRAM SELECTIVE EACH ADDITIONAL VESSEL: IMG667

## 2018-12-22 HISTORY — PX: IR EMBO TUMOR ORGAN ISCHEMIA INFARCT INC GUIDE ROADMAPPING: IMG5449

## 2018-12-22 HISTORY — PX: IR ANGIOGRAM PELVIS SELECTIVE OR SUPRASELECTIVE: IMG661

## 2018-12-22 LAB — CBC WITH DIFFERENTIAL/PLATELET
Abs Immature Granulocytes: 0.01 10*3/uL (ref 0.00–0.07)
Basophils Absolute: 0 10*3/uL (ref 0.0–0.1)
Basophils Relative: 0 %
Eosinophils Absolute: 0 10*3/uL (ref 0.0–0.5)
Eosinophils Relative: 0 %
HCT: 45.4 % (ref 36.0–46.0)
Hemoglobin: 15.2 g/dL — ABNORMAL HIGH (ref 12.0–15.0)
Immature Granulocytes: 0 %
Lymphocytes Relative: 26 %
Lymphs Abs: 1.6 10*3/uL (ref 0.7–4.0)
MCH: 29.3 pg (ref 26.0–34.0)
MCHC: 33.5 g/dL (ref 30.0–36.0)
MCV: 87.6 fL (ref 80.0–100.0)
Monocytes Absolute: 0.4 10*3/uL (ref 0.1–1.0)
Monocytes Relative: 6 %
Neutro Abs: 4.1 10*3/uL (ref 1.7–7.7)
Neutrophils Relative %: 68 %
Platelets: 225 10*3/uL (ref 150–400)
RBC: 5.18 MIL/uL — ABNORMAL HIGH (ref 3.87–5.11)
RDW: 15.1 % (ref 11.5–15.5)
WBC: 6 10*3/uL (ref 4.0–10.5)
nRBC: 0 % (ref 0.0–0.2)

## 2018-12-22 LAB — POTASSIUM: Potassium: 3 mmol/L — ABNORMAL LOW (ref 3.5–5.1)

## 2018-12-22 LAB — BASIC METABOLIC PANEL
Anion gap: 8 (ref 5–15)
BUN: 9 mg/dL (ref 6–20)
CO2: 27 mmol/L (ref 22–32)
Calcium: 8.8 mg/dL — ABNORMAL LOW (ref 8.9–10.3)
Chloride: 107 mmol/L (ref 98–111)
Creatinine, Ser: 0.68 mg/dL (ref 0.44–1.00)
GFR calc Af Amer: >60 mL/min (ref >60)
GFR calc non Af Amer: >60 mL/min (ref >60)
Glucose, Bld: 95 mg/dL (ref 70–99)
Potassium: 2.9 mmol/L — ABNORMAL LOW (ref 3.5–5.1)
Sodium: 142 mmol/L (ref 135–145)

## 2018-12-22 LAB — HCG, SERUM, QUALITATIVE: Preg, Serum: NEGATIVE

## 2018-12-22 MED ORDER — CEFAZOLIN SODIUM-DEXTROSE 2-4 GM/100ML-% IV SOLN
INTRAVENOUS | Status: AC
  Administered 2018-12-22: 15:00:00 2 g via INTRAVENOUS
  Filled 2018-12-22: qty 100

## 2018-12-22 MED ORDER — IOHEXOL 300 MG/ML  SOLN
100.0000 mL | Freq: Once | INTRAMUSCULAR | Status: AC | PRN
  Administered 2018-12-22: 45 mL via INTRA_ARTERIAL

## 2018-12-22 MED ORDER — CEFAZOLIN SODIUM-DEXTROSE 2-4 GM/100ML-% IV SOLN
2.0000 g | INTRAVENOUS | Status: AC
  Administered 2018-12-22: 2 g via INTRAVENOUS

## 2018-12-22 MED ORDER — SODIUM CHLORIDE 0.9 % IV SOLN: 250.0000 mL | INTRAVENOUS | Status: DC | PRN

## 2018-12-22 MED ORDER — KCL IN DEXTROSE-NACL 40-5-0.45 MEQ/L-%-% IV SOLN
INTRAVENOUS | Status: AC
  Administered 2018-12-22 (×2): via INTRAVENOUS
  Filled 2018-12-22 (×2): qty 1000

## 2018-12-22 MED ORDER — DEXAMETHASONE 4 MG PO TABS
8.0000 mg | ORAL_TABLET | Freq: Once | ORAL | Status: AC
  Administered 2018-12-22: 8 mg via ORAL
  Filled 2018-12-22: qty 2

## 2018-12-22 MED ORDER — SODIUM CHLORIDE 0.9 % IV SOLN
INTRAVENOUS | Status: DC
  Administered 2018-12-22: 13:00:00 via INTRAVENOUS

## 2018-12-22 MED ORDER — LIDOCAINE HCL (PF) 1 % IJ SOLN
INTRAMUSCULAR | Status: AC | PRN
  Administered 2018-12-22: 5 mL

## 2018-12-22 MED ORDER — IOHEXOL 300 MG/ML  SOLN
100.0000 mL | Freq: Once | INTRAMUSCULAR | Status: AC | PRN
  Administered 2018-12-22: 24 mL via INTRA_ARTERIAL

## 2018-12-22 MED ORDER — HYDROMORPHONE 1 MG/ML IV SOLN
INTRAVENOUS | Status: DC
  Administered 2018-12-22: 30 mg via INTRAVENOUS
  Administered 2018-12-22: 0.9 mg via INTRAVENOUS
  Administered 2018-12-22: 1.2 mg via INTRAVENOUS
  Filled 2018-12-22 (×10): qty 30

## 2018-12-22 MED ORDER — PROMETHAZINE HCL 25 MG PO TABS: 25.0000 mg | ORAL_TABLET | Freq: Three times a day (TID) | ORAL | Status: DC | PRN

## 2018-12-22 MED ORDER — SODIUM CHLORIDE 0.9% FLUSH
3.0000 mL | Freq: Two times a day (BID) | INTRAVENOUS | Status: DC
  Administered 2018-12-23: 3 mL via INTRAVENOUS

## 2018-12-22 MED ORDER — KETOROLAC TROMETHAMINE 30 MG/ML IJ SOLN
INTRAMUSCULAR | Status: AC
  Administered 2018-12-22: 30 mg via INTRAVENOUS
  Filled 2018-12-22: qty 1

## 2018-12-22 MED ORDER — FENTANYL CITRATE (PF) 100 MCG/2ML IJ SOLN
INTRAMUSCULAR | Status: AC | PRN
  Administered 2018-12-22 (×4): 50 ug via INTRAVENOUS

## 2018-12-22 MED ORDER — FENTANYL CITRATE (PF) 100 MCG/2ML IJ SOLN
INTRAMUSCULAR | Status: AC
  Filled 2018-12-22: qty 4

## 2018-12-22 MED ORDER — ONDANSETRON HCL 4 MG/2ML IJ SOLN: 4.0000 mg | Freq: Four times a day (QID) | INTRAMUSCULAR | Status: DC | PRN

## 2018-12-22 MED ORDER — MIDAZOLAM HCL 2 MG/2ML IJ SOLN
INTRAMUSCULAR | Status: AC | PRN
  Administered 2018-12-22 (×6): 1 mg via INTRAVENOUS

## 2018-12-22 MED ORDER — SODIUM CHLORIDE 0.9% FLUSH: 3.0000 mL | INTRAVENOUS | Status: DC | PRN

## 2018-12-22 MED ORDER — DIPHENHYDRAMINE HCL 12.5 MG/5ML PO ELIX
12.5000 mg | ORAL_SOLUTION | Freq: Four times a day (QID) | ORAL | Status: DC | PRN
  Filled 2018-12-22: qty 5

## 2018-12-22 MED ORDER — KETOROLAC TROMETHAMINE 30 MG/ML IJ SOLN
30.0000 mg | Freq: Four times a day (QID) | INTRAMUSCULAR | Status: DC
  Administered 2018-12-22 – 2018-12-23 (×2): 30 mg via INTRAVENOUS
  Filled 2018-12-22 (×2): qty 1

## 2018-12-22 MED ORDER — MIDAZOLAM HCL 2 MG/2ML IJ SOLN
INTRAMUSCULAR | Status: AC
  Filled 2018-12-22: qty 6

## 2018-12-22 MED ORDER — DOCUSATE SODIUM 100 MG PO CAPS
100.0000 mg | ORAL_CAPSULE | Freq: Two times a day (BID) | ORAL | Status: DC
  Administered 2018-12-22 – 2018-12-23 (×2): 100 mg via ORAL
  Filled 2018-12-22 (×3): qty 1

## 2018-12-22 MED ORDER — DIPHENHYDRAMINE HCL 50 MG/ML IJ SOLN: 12.5000 mg | Freq: Four times a day (QID) | INTRAMUSCULAR | Status: DC | PRN

## 2018-12-22 MED ORDER — PROMETHAZINE HCL 25 MG RE SUPP: 25.0000 mg | Freq: Three times a day (TID) | RECTAL | Status: DC | PRN

## 2018-12-22 MED ORDER — KETOROLAC TROMETHAMINE 30 MG/ML IJ SOLN
30.0000 mg | INTRAMUSCULAR | Status: AC
  Administered 2018-12-22: 15:00:00 30 mg via INTRAVENOUS

## 2018-12-22 MED ORDER — NALOXONE HCL 0.4 MG/ML IJ SOLN: 0.4000 mg | INTRAMUSCULAR | Status: DC | PRN

## 2018-12-22 MED ORDER — ONDANSETRON HCL 4 MG/2ML IJ SOLN
4.0000 mg | Freq: Four times a day (QID) | INTRAMUSCULAR | Status: DC | PRN
  Administered 2018-12-22: 22:00:00 4 mg via INTRAVENOUS
  Filled 2018-12-22: qty 2

## 2018-12-22 MED ORDER — SODIUM CHLORIDE 0.9% FLUSH: 9.0000 mL | INTRAVENOUS | Status: DC | PRN

## 2018-12-22 MED ORDER — LIDOCAINE HCL 1 % IJ SOLN
INTRAMUSCULAR | Status: AC
  Filled 2018-12-22: qty 20

## 2018-12-22 NOTE — Procedures (Signed)
Interventional Radiology Procedure Note  Procedure: Uterine fibroid embolization  Complications: None  Estimated Blood Loss: < 10 mL  Findings: Bilateral uterine arteries embolized with 1.5 vials of 500-700 micron Embospheres each. Good occlusion of branches supplying fibroids.   Plan: Overnight observation.  Venetia Night. Kathlene Cote, M.D Pager:  [phone]

## 2018-12-22 NOTE — H&P (Signed)
Referring Physician(s): Silva,B  Supervising Physician: Aletta Edouard  Patient Status:  [facility] TBA  Chief Complaint: Symptomatic uterine fibroids   Subjective: Patient familiar to IR service from consultation with Dr. Kathlene Cote on 09/17/2018 to discuss treatment options for symptomatic uterine fibroids.  She was deemed an appropriate candidate for bilateral uterine artery embolization and presents today for the procedure.  She currently denies fever, headache, chest pain, dyspnea, cough, significant abdominal pain, back pain, nausea, vomiting.  She does have a history of menorrhagia and dysmenorrhea.  She is COVID-19 negative.  Past Medical History:  Diagnosis Date  . Anemia   . Arthritis   . Back pain    lower back   . Complication of anesthesia    age 71 had twilight anesthesia and woke up during surgery   . Depression   . Fatty liver 01/22/2016   Noted on MRI abd  . Fibroids   . Heavy periods   . History of gallstones   . History of thyroid nodule    Left  . Hypertension   . Irregular periods   . Obesity   . Pancreatic lesion   . Pinched nerve    in back  . Ventral hernia 12/26/2014   Midline noted on CT Abd pelvis   Past Surgical History:  Procedure Laterality Date  . benign breast cyst removed Left    age 60  . CHOLECYSTECTOMY    . INSERTION OF MESH N/A 02/27/2015   Procedure: INSERTION OF MESH;  Surgeon: Jackolyn Confer, MD;  Location: [facility];  Service: General;  Laterality: N/A;  . IR RADIOLOGIST EVAL & MGMT  09/17/2018  . LAPAROSCOPIC ASSISTED VENTRAL HERNIA REPAIR N/A 02/27/2015   Procedure: LAPAROSCOPIC VENTRAL HERNIA REPAIR WITH MESH;  Surgeon: Jackolyn Confer, MD;  Location: [facility];  Service: General;  Laterality: N/A;  . THYROIDECTOMY, PARTIAL    . TOTAL HIP ARTHROPLASTY Right 11/28/2017   Procedure: RIGHT TOTAL HIP ARTHROPLASTY ANTERIOR APPROACH;  Surgeon: Mcarthur Rossetti, MD;  Location: [facility];  Service: Orthopedics;  Laterality: Right;  .  TOTAL HIP ARTHROPLASTY Left 07/31/2018   Procedure: LEFT TOTAL HIP ARTHROPLASTY ANTERIOR APPROACH;  Surgeon: Mcarthur Rossetti, MD;  Location: [facility];  Service: Orthopedics;  Laterality: Left;  Failed Spinal     Allergies: Patient has no known allergies.  Medications: Prior to Admission medications   Medication Sig Start Date End Date Taking? Authorizing Provider  amLODipine-benazepril (LOTREL) 10-20 MG capsule Take 1 capsule by mouth once daily 11/26/18   Billie Ruddy, MD  cyclobenzaprine (FLEXERIL) 10 MG tablet Take 1 tablet (10 mg total) by mouth at bedtime. 10/20/18   Pete Pelt, PA-C  diclofenac sodium (VOLTAREN) 1 % GEL Apply 2 g topically 4 (four) times daily. 11/19/18   Mcarthur Rossetti, MD  DULoxetine (CYMBALTA) 30 MG capsule Take 1 capsule (30 mg total) by mouth 2 (two) times daily. 09/30/18   Billie Ruddy, MD  ferrous sulfate 325 (65 FE) MG tablet Take 1 tablet (325 mg total) by mouth daily with breakfast. 10/29/17   Billie Ruddy, MD  methocarbamol (ROBAXIN) 500 MG tablet Take 1 tablet (500 mg total) by mouth every 6 (six) hours as needed for muscle spasms. 08/16/18   Mcarthur Rossetti, MD  tiZANidine (ZANAFLEX) 4 MG tablet Take 1 tablet (4 mg total) by mouth every 8 (eight) hours as needed for muscle spasms. 11/24/18   Mcarthur Rossetti, MD     Vital Signs: Blood  pressure 145/104, heart rate 91, temp 98.1, respirations 18, O2 sats 100% room air LMP 12/08/2018 (Exact Date)   Physical Exam awake/alert; chest clear to auscultation bilaterally.  Heart with regular rate and rhythm.  Abdomen soft, positive bowel sounds, mild pelvic tenderness to palpation.  No significant lower extremity edema, intact distal pulses.  Imaging: No results found.  Labs:  CBC: Recent Labs    07/27/18 1459 08/01/18 0329 09/10/18 1014 12/22/18 1242  WBC 6.8 7.7 6.6 6.0  HGB 12.6 11.0* 12.5 15.2*  HCT 41.0 35.9* 39.8 45.4  PLT 279 223 329 225    COAGS: No  results for input(s): INR, APTT in the last 8760 hours.  BMP: Recent Labs    01/14/18 0941 07/27/18 1459 08/01/18 0329 09/28/18 1556  NA 142 141 138  --   K 3.8 3.5 3.8  --   CL 104 106 103  --   CO2 30 26 26   --   GLUCOSE 92 86 129*  --   BUN 5* 18 8  --   CALCIUM 9.1 9.1 8.1*  --   CREATININE 0.69 0.98 0.72 0.90  GFRNONAA  --  >60 >60  --   GFRAA  --  >60 >60  --     LIVER FUNCTION TESTS: No results for input(s): BILITOT, AST, ALT, ALKPHOS, PROT, ALBUMIN in the last 8760 hours.  Assessment and Plan: Patient with history of symptomatic uterine fibroids, seen in consultation by Dr. Kathlene Cote on 09/17/2018 and deemed an appropriate candidate for bilateral uterine artery embolization.  She presents today for the procedure.Risks and benefits of procedure were discussed with the patient including, but not limited to bleeding, infection, vascular injury or contrast induced renal failure.  This interventional procedure involves the use of X-rays and because of the nature of the planned procedure, it is possible that we will have prolonged use of X-ray fluoroscopy.  Potential radiation risks to you include (but are not limited to) the following: - A slightly elevated risk for cancer  several years later in life. This risk is typically less than 0.5% percent. This risk is low in comparison to the normal incidence of human cancer, which is 33% for women and 50% for men according to the [employer]. - Radiation induced injury can include skin redness, resembling a rash, tissue breakdown / ulcers and hair loss (which can be temporary or permanent).   The likelihood of either of these occurring depends on the difficulty of the procedure and whether you are sensitive to radiation due to previous procedures, disease, or genetic conditions.   IF your procedure requires a prolonged use of radiation, you will be notified and given written instructions for further action.  It is your  responsibility to monitor the irradiated area for the 2 weeks following the procedure and to notify your physician if you are concerned that you have suffered a radiation induced injury.    All of the patient's questions were answered, patient is agreeable to proceed.  Consent signed and in chart.  She is COVID-19 neg; post procedure she will be admitted for overnight observation for pain control.  LABS PENDING  Electronically Signed: D. Rowe Robert, PA-C 12/22/2018, 12:59 PM   I spent a total of 30 minutes at the the patient's bedside AND on the patient's hospital floor or unit, greater than 50% of which was counseling/coordinating care for bilateral uterine artery embolization

## 2018-12-22 NOTE — Progress Notes (Addendum)
MD Vernard Gambles from IR was paged because patient has a PCA without any orders. Per MD Vernard Gambles continue PCA until morning and discontinue foley now.

## 2018-12-23 ENCOUNTER — Other Ambulatory Visit: Payer: Self-pay | Admitting: Family Medicine

## 2018-12-23 DIAGNOSIS — D509 Iron deficiency anemia, unspecified: Secondary | ICD-10-CM

## 2018-12-23 DIAGNOSIS — D259 Leiomyoma of uterus, unspecified: Secondary | ICD-10-CM | POA: Diagnosis not present

## 2018-12-23 LAB — BASIC METABOLIC PANEL
Anion gap: 11 (ref 5–15)
BUN: 5 mg/dL — ABNORMAL LOW (ref 6–20)
CO2: 23 mmol/L (ref 22–32)
Calcium: 8.6 mg/dL — ABNORMAL LOW (ref 8.9–10.3)
Chloride: 103 mmol/L (ref 98–111)
Creatinine, Ser: 0.69 mg/dL (ref 0.44–1.00)
GFR calc Af Amer: >60 mL/min (ref >60)
GFR calc non Af Amer: >60 mL/min (ref >60)
Glucose, Bld: 135 mg/dL — ABNORMAL HIGH (ref 70–99)
Potassium: 3.7 mmol/L (ref 3.5–5.1)
Sodium: 137 mmol/L (ref 135–145)

## 2018-12-23 MED ORDER — DULOXETINE HCL 30 MG PO CPEP
30.0000 mg | ORAL_CAPSULE | Freq: Every day | ORAL | Status: DC
  Administered 2018-12-23: 30 mg via ORAL
  Filled 2018-12-23: qty 1

## 2018-12-23 NOTE — Discharge Summary (Signed)
Patient ID: Rachel Warren MRN: [id number] DOB/AGE: 17-Oct-1975 43 y.o.  Admit date: 12/22/2018 Discharge date: 12/23/2018  Supervising Physician: Markus Daft  Patient Status: [facility] - In-pt  Admission Diagnoses: Symptomatic uterine fibroids  Discharge Diagnoses:  Active Problems:   Fibroids   Discharged Condition: good  Hospital Course:  Rachel Warren is a 43 year old female with past medical history of depression, hypertension, and anemia related to uterine fibroids.  Patient presented to [facility] yesterday for uterine artery embolization.  She udnerwent successful procedure yesterday with Dr. Kathlene Cote and was admitted overnight for observation. She did well overnight with adequate pain control.  She has tolerated breakfast this AM without nausea/vomiting.  She has ambulated in her room.  She has voided without difficulty.  Patient is stable for discharge home today.  She is given prescription for Norco 5/325 mg #30, Ibuprofen 600 mg q 6hrs x 5 day #30, Colace 100mg , and Zofran 8mg  tablets.  Discussed discharge instructions and plans.  Patient understands she will hear from schedulers with date and time of appointment.   Consults: None  Discharge Exam: Blood pressure (!) 124/101, pulse 61, temperature 97.9 F (36.6 C), temperature source Oral, resp. rate 16, last menstrual period 12/08/2018, SpO2 95 %. General appearance: alert and no distress Resp: clear to auscultation bilaterally Cardio: regular rate and rhythm, S1, S2 normal, no murmur, click, rub or gallop GI: soft, non-tender; bowel sounds normal; no masses,  no organomegaly Skin: Skin color, texture, turgor normal. No rashes or lesions Incision/Wound: Puncture site intact, clean, and dry. Non-tender.  Disposition: Discharge disposition: 01-Home or Self Care       Discharge Instructions    Call MD for:  difficulty breathing, headache or visual disturbances   Complete by:  As directed    Call MD for:  persistant  nausea and vomiting   Complete by:  As directed    Call MD for:  redness, tenderness, or signs of infection (pain, swelling, redness, odor or green/yellow discharge around incision site)   Complete by:  As directed    Call MD for:  severe uncontrolled pain   Complete by:  As directed    Call MD for:  temperature >100.4   Complete by:  As directed    Diet - low sodium heart healthy   Complete by:  As directed    Discharge instructions   Complete by:  As directed    May remove dressing on groin tomorrow.  Take ibuprofen as prescribed, all other pain medicines as needed. You will hear from schedulers with date and time of follow-up appointment.   Increase activity slowly   Complete by:  As directed      Allergies as of 12/23/2018   No Known Allergies     Medication List    TAKE these medications   amLODipine-benazepril 10-20 MG capsule Commonly known as:  LOTREL Take 1 capsule by mouth once daily   cyclobenzaprine 10 MG tablet Commonly known as:  FLEXERIL Take 1 tablet (10 mg total) by mouth at bedtime. What changed:    when to take this  reasons to take this   diclofenac sodium 1 % Gel Commonly known as:  Voltaren Apply 2 g topically 4 (four) times daily. What changed:    when to take this  reasons to take this   DULoxetine 30 MG capsule Commonly known as:  CYMBALTA Take 1 capsule (30 mg total) by mouth 2 (two) times daily.   ferrous sulfate 325 (65  FE) MG tablet Take 1 tablet (325 mg total) by mouth daily with breakfast.   methocarbamol 500 MG tablet Commonly known as:  ROBAXIN Take 1 tablet (500 mg total) by mouth every 6 (six) hours as needed for muscle spasms.   tiZANidine 4 MG tablet Commonly known as:  Zanaflex Take 1 tablet (4 mg total) by mouth every 8 (eight) hours as needed for muscle spasms.      Follow-up Information    Aletta Edouard, MD Follow up.   Specialties:  Interventional Radiology, Radiology Why:  You will hear from schedulers  with date and time of appointment. Contact information: [address] [place] [address] [phone]            Electronically Signed: Docia Barrier, PA 12/23/2018, 12:19 PM   I have spent Greater Than 30 Minutes discharging Rachel Warren.

## 2018-12-23 NOTE — Progress Notes (Signed)
Pt alert, oriented, tolerating diet, pain controlled.  D/C instructions given, prescriptions given.  Pt d/cd home.

## 2018-12-28 ENCOUNTER — Other Ambulatory Visit: Payer: Self-pay | Admitting: Physician Assistant

## 2018-12-28 ENCOUNTER — Telehealth: Payer: Self-pay | Admitting: Physician Assistant

## 2018-12-28 NOTE — Telephone Encounter (Signed)
  Received refill request for Zofran.  Spoke with patient and she is not having any nausea.  She states that she never picked up the Zofran because her insurance would not cover it and it was too expensive.  I offered to call in phenergan but she states she doesn't think she needs it.  I have returned Fax to her pharmacy stating this.  Koty Anctil S Sayaka Hoeppner PA-C 12/28/2018 9:19 AM

## 2018-12-30 ENCOUNTER — Other Ambulatory Visit: Payer: Self-pay | Admitting: Obstetrics and Gynecology

## 2019-01-05 ENCOUNTER — Encounter: Payer: Self-pay | Admitting: *Deleted

## 2019-01-05 ENCOUNTER — Ambulatory Visit
Admission: RE | Admit: 2019-01-05 | Discharge: 2019-01-05 | Disposition: A | Discharge status: Home / Self Care | Payer: BC Managed Care – PPO | Source: Ambulatory Visit | Attending: Student | Admitting: Student

## 2019-01-05 ENCOUNTER — Other Ambulatory Visit: Payer: Self-pay

## 2019-01-05 DIAGNOSIS — D219 Benign neoplasm of connective and other soft tissue, unspecified: Secondary | ICD-10-CM

## 2019-01-05 HISTORY — PX: IR RADIOLOGIST EVAL & MGMT: IMG5224

## 2019-01-05 NOTE — Progress Notes (Signed)
Chief Complaint: Patient was consulted remotely today (TeleHealth) for follow up after fibroid embolization.  History of Present Illness: Rachel Warren is a 43 y.o. female status post fibroid embolization on 12/22/2018, discharged on 6/10. She had about 3-4 days of periodic cramping. Nauseated first day after discharge but no longer. One day of fever to 100.5 which resolved. Some mild spotting of blood after procedure for 2-3 days. Took ibuprofen for 5 days. No menstrual cycle since procedure. Eating fine. Normal bowel movements. Feels back to baseline and normal activity.   Past Medical History:  Diagnosis Date   Anemia    Arthritis    Back pain    lower back    Complication of anesthesia    age 32 had twilight anesthesia and woke up during surgery    Depression    Fatty liver 01/22/2016   Noted on MRI abd   Fibroids    Heavy periods    History of gallstones    History of thyroid nodule    Left   Hypertension    Irregular periods    Obesity    Pancreatic lesion    Pinched nerve    in back   Ventral hernia 12/26/2014   Midline noted on CT Abd pelvis    Past Surgical History:  Procedure Laterality Date   benign breast cyst removed Left    age 60   CHOLECYSTECTOMY     INSERTION OF MESH N/A 02/27/2015   Procedure: INSERTION OF MESH;  Surgeon: Jackolyn Confer, MD;  Location: [facility];  Service: General;  Laterality: N/A;   IR ANGIOGRAM PELVIS SELECTIVE OR SUPRASELECTIVE  12/22/2018   IR ANGIOGRAM PELVIS SELECTIVE OR SUPRASELECTIVE  12/22/2018   IR ANGIOGRAM SELECTIVE EACH ADDITIONAL VESSEL  12/22/2018   IR ANGIOGRAM SELECTIVE EACH ADDITIONAL VESSEL  12/22/2018   IR EMBO TUMOR ORGAN ISCHEMIA INFARCT INC GUIDE ROADMAPPING  12/22/2018   IR RADIOLOGIST EVAL & MGMT  09/17/2018   IR US GUIDE VASC ACCESS RIGHT  12/22/2018   LAPAROSCOPIC ASSISTED VENTRAL HERNIA REPAIR N/A 02/27/2015   Procedure: LAPAROSCOPIC VENTRAL HERNIA REPAIR WITH MESH;  Surgeon: Jackolyn Confer, MD;  Location: [facility];  Service: General;  Laterality: N/A;   THYROIDECTOMY, PARTIAL     TOTAL HIP ARTHROPLASTY Right 11/28/2017   Procedure: RIGHT TOTAL HIP ARTHROPLASTY ANTERIOR APPROACH;  Surgeon: Mcarthur Rossetti, MD;  Location: [facility];  Service: Orthopedics;  Laterality: Right;   TOTAL HIP ARTHROPLASTY Left 07/31/2018   Procedure: LEFT TOTAL HIP ARTHROPLASTY ANTERIOR APPROACH;  Surgeon: Mcarthur Rossetti, MD;  Location: [facility];  Service: Orthopedics;  Laterality: Left;  Failed Spinal    Allergies: Patient has no known allergies.  Medications: Prior to Admission medications   Medication Sig Start Date End Date Taking? Authorizing Provider  amLODipine-benazepril (LOTREL) 10-20 MG capsule Take 1 capsule by mouth once daily 11/26/18   Billie Ruddy, MD  cyclobenzaprine (FLEXERIL) 10 MG tablet Take 1 tablet (10 mg total) by mouth at bedtime. Patient taking differently: Take 10 mg by mouth 3 (three) times daily as needed for muscle spasms.  10/20/18   Pete Pelt, PA-C  diclofenac sodium (VOLTAREN) 1 % GEL Apply 2 g topically 4 (four) times daily. Patient taking differently: Apply 2 g topically 4 (four) times daily as needed (pain).  11/19/18   Mcarthur Rossetti, MD  DULoxetine (CYMBALTA) 30 MG capsule Take 1 capsule (30 mg total) by mouth 2 (two) times daily. 09/30/18   Grier Mitts  R, MD  Ferrous Sulfate (IRON) 325 (65 Fe) MG TABS Take 1 tablet by mouth once daily with breakfast 12/24/18   Billie Ruddy, MD  methocarbamol (ROBAXIN) 500 MG tablet Take 1 tablet (500 mg total) by mouth every 6 (six) hours as needed for muscle spasms. 08/16/18   Mcarthur Rossetti, MD  tiZANidine (ZANAFLEX) 4 MG tablet Take 1 tablet (4 mg total) by mouth every 8 (eight) hours as needed for muscle spasms. 11/24/18   Mcarthur Rossetti, MD     Family History  Problem Relation Age of Onset   Hypertension Father    Arthritis Father    Clotting disorder Father     Prostate cancer Paternal Grandfather     Social History   Socioeconomic History   Marital status: Married    Spouse name: Not on file   Number of children: Not on file   Years of education: Not on file   Highest education level: Not on file  Occupational History   Not on file  Social Needs   Financial resource strain: Not on file   Food insecurity    Worry: Not on file    Inability: Not on file   Transportation needs    Medical: Not on file    Non-medical: Not on file  Tobacco Use   Smoking status: Former Smoker    Types: Cigarettes    Quit date: 06/14/2017    Years since quitting: 1.5   Smokeless tobacco: Never Used  Substance and Sexual Activity   Alcohol use: Yes    Comment: occasionally; last drink yesterday afternoon 12/21/2018   Drug use: Yes    Types: Marijuana    Comment: last time 07/08/2018   Sexual activity: Yes    Birth control/protection: Injection  Lifestyle   Physical activity    Days per week: Not on file    Minutes per session: Not on file   Stress: Not on file  Relationships   Social connections    Talks on phone: Not on file    Gets together: Not on file    Attends religious service: Not on file    Active member of club or organization: Not on file    Attends meetings of clubs or organizations: Not on file    Relationship status: Not on file  Other Topics Concern   Not on file  Social History Narrative   Not on file    Review of Systems  Constitutional: Negative.   HENT: Negative.   Respiratory: Negative.   Cardiovascular: Negative.   Gastrointestinal: Negative.   Genitourinary: Negative.   Musculoskeletal: Negative.   Skin: Negative.   Neurological: Negative.     Review of Systems: A 12 point ROS discussed and pertinent positives are indicated in the HPI above.  All other systems are negative.  Physical Exam No direct physical exam was performed.   LMP 12/08/2018 (Exact Date)   Imaging: Ir Angiogram Pelvis  Selective Or Supraselective  Result Date: 12/22/2018 CLINICAL DATA:  Symptomatic uterine fibroids with menorrhagia and dysmenorrhea. EXAM: 1. ULTRASOUND GUIDANCE FOR VASCULAR ACCESS OF THE RIGHT COMMON FEMORAL ARTERY 2. SELECTIVE PELVIC ARTERIOGRAPHY OF THE LEFT INTERNAL ILIAC ARTERY 3. ADDITIONAL SELECTIVE ARTERIOGRAPHY OF THE LEFT UTERINE ARTERY 4. TRANSCATHETER EMBOLIZATION OF THE LEFT UTERINE ARTERY TO TREAT UTERINE FIBROIDS 5. SELECTIVE PELVIC ARTERIOGRAPHY OF THE RIGHT INTERNAL ILIAC ARTERY 6. ADDITIONAL SELECTIVE ARTERIOGRAPHY OF THE RIGHT UTERINE ARTERY 7. TRANSCATHETER EMBOLIZATION OF THE RIGHT UTERINE ARTERY TO TREAT UTERINE FIBROIDS ANESTHESIA/SEDATION:  Moderate (conscious) sedation was employed during this procedure. A total of Versed 6.0 mg and Fentanyl 200 mcg was administered intravenously. Moderate Sedation Time: 58 minutes. The patient's level of consciousness and vital signs were monitored continuously by radiology nursing throughout the procedure under my direct supervision. MEDICATIONS: 2 g IV Ancef, 30 mg IV Toradol FLUOROSCOPY TIME:  17 minutes and 6 seconds.  1505 mGy. CONTRAST:  114 mL Omnipaque 300 PROCEDURE: The procedure, risks, benefits, and alternatives were explained to the patient. Questions regarding the procedure were encouraged and answered. The patient understands and consents to the procedure. A time-out was performed prior to initiating the procedure. The right groin was prepped with chlorhexidine in a sterile fashion, and a sterile drape was applied covering the operative field. A sterile gown and sterile gloves were used for the procedure. Local anesthesia was provided with 1% Lidocaine. Ultrasound was used to confirm patency of the right common femoral artery. After a small skin incision, a 21 gauge needle was advanced into the right common femoral artery under direct ultrasound guidance. Ultrasound image documentation was performed. After establishing guide wire access, a  5-French sheath was placed. A 5 Fr diagnostic catheter was advanced over a guidewire into the distal abdominal aorta. The catheter was then used to selectively catheterize the left common iliac artery followed by the left internal iliac artery. Selective arteriography was performed of the left internal iliac artery. A 2.8 Fr coaxial microcatheter was then introduced through the diagnostic catheter and advanced into the left uterine artery over a guide wire utilizing road mapping technique. Selective arteriography of the left uterine artery was performed through the microcatheter. Left uterine artery embolization was then performed with installation of microsphere particles. Follow-up arteriography was performed after embolization. The microcatheter was removed. The diagnostic catheter was then retracted and used to selectively catheterize the right internal iliac artery. Selective arteriography was performed of the right internal iliac artery. The microcatheter was then reintroduced and advanced into the right uterine artery over a guidewire utilizing road mapping technique. Selective arteriography of the right uterine artery was then performed through the microcatheter. Right uterine artery embolization was then performed with installation of microsphere particles. Follow-up arteriography was performed after embolization. Right femoral arteriotomy hemostasis: Cordis ExoSeal COMPLICATIONS: None. FINDINGS: Bilateral uterine arteriography shows multiple enlarged hypervascular trunks supplying uterine fibroids. Left uterine artery embolization was performed utilizing 1.5 vials of 500-700 micron sized Embosphere particles. Completion arteriography demonstrates adequate occlusion of branches supplying uterine fibroids. Right uterine artery embolization was performed utilizing 1.5 vials of 500-700 micron sized Embosphere particles. Completion arteriography demonstrates adequate occlusion of branches supplying uterine  fibroids. Adequate hemostasis was achieved at the femoral arteriotomy site. IMPRESSION: Successful bilateral uterine artery embolization to treat symptomatic uterine fibroid disease. Adequate occlusion of branch vessels supplying uterine fibroids was achieved with microsphere particle embolization. The patient was admitted for overnight observation for treatment of post embolization symptoms. Electronically Signed   By: Aletta Edouard M.D.   On: 12/22/2018 17:34   Ir Angiogram Pelvis Selective Or Supraselective  Result Date: 12/22/2018 CLINICAL DATA:  Symptomatic uterine fibroids with menorrhagia and dysmenorrhea. EXAM: 1. ULTRASOUND GUIDANCE FOR VASCULAR ACCESS OF THE RIGHT COMMON FEMORAL ARTERY 2. SELECTIVE PELVIC ARTERIOGRAPHY OF THE LEFT INTERNAL ILIAC ARTERY 3. ADDITIONAL SELECTIVE ARTERIOGRAPHY OF THE LEFT UTERINE ARTERY 4. TRANSCATHETER EMBOLIZATION OF THE LEFT UTERINE ARTERY TO TREAT UTERINE FIBROIDS 5. SELECTIVE PELVIC ARTERIOGRAPHY OF THE RIGHT INTERNAL ILIAC ARTERY 6. ADDITIONAL SELECTIVE ARTERIOGRAPHY OF THE RIGHT UTERINE ARTERY 7. TRANSCATHETER  EMBOLIZATION OF THE RIGHT UTERINE ARTERY TO TREAT UTERINE FIBROIDS ANESTHESIA/SEDATION: Moderate (conscious) sedation was employed during this procedure. A total of Versed 6.0 mg and Fentanyl 200 mcg was administered intravenously. Moderate Sedation Time: 58 minutes. The patient's level of consciousness and vital signs were monitored continuously by radiology nursing throughout the procedure under my direct supervision. MEDICATIONS: 2 g IV Ancef, 30 mg IV Toradol FLUOROSCOPY TIME:  17 minutes and 6 seconds.  1505 mGy. CONTRAST:  114 mL Omnipaque 300 PROCEDURE: The procedure, risks, benefits, and alternatives were explained to the patient. Questions regarding the procedure were encouraged and answered. The patient understands and consents to the procedure. A time-out was performed prior to initiating the procedure. The right groin was prepped with  chlorhexidine in a sterile fashion, and a sterile drape was applied covering the operative field. A sterile gown and sterile gloves were used for the procedure. Local anesthesia was provided with 1% Lidocaine. Ultrasound was used to confirm patency of the right common femoral artery. After a small skin incision, a 21 gauge needle was advanced into the right common femoral artery under direct ultrasound guidance. Ultrasound image documentation was performed. After establishing guide wire access, a 5-French sheath was placed. A 5 Fr diagnostic catheter was advanced over a guidewire into the distal abdominal aorta. The catheter was then used to selectively catheterize the left common iliac artery followed by the left internal iliac artery. Selective arteriography was performed of the left internal iliac artery. A 2.8 Fr coaxial microcatheter was then introduced through the diagnostic catheter and advanced into the left uterine artery over a guide wire utilizing road mapping technique. Selective arteriography of the left uterine artery was performed through the microcatheter. Left uterine artery embolization was then performed with installation of microsphere particles. Follow-up arteriography was performed after embolization. The microcatheter was removed. The diagnostic catheter was then retracted and used to selectively catheterize the right internal iliac artery. Selective arteriography was performed of the right internal iliac artery. The microcatheter was then reintroduced and advanced into the right uterine artery over a guidewire utilizing road mapping technique. Selective arteriography of the right uterine artery was then performed through the microcatheter. Right uterine artery embolization was then performed with installation of microsphere particles. Follow-up arteriography was performed after embolization. Right femoral arteriotomy hemostasis: Cordis ExoSeal COMPLICATIONS: None. FINDINGS: Bilateral uterine  arteriography shows multiple enlarged hypervascular trunks supplying uterine fibroids. Left uterine artery embolization was performed utilizing 1.5 vials of 500-700 micron sized Embosphere particles. Completion arteriography demonstrates adequate occlusion of branches supplying uterine fibroids. Right uterine artery embolization was performed utilizing 1.5 vials of 500-700 micron sized Embosphere particles. Completion arteriography demonstrates adequate occlusion of branches supplying uterine fibroids. Adequate hemostasis was achieved at the femoral arteriotomy site. IMPRESSION: Successful bilateral uterine artery embolization to treat symptomatic uterine fibroid disease. Adequate occlusion of branch vessels supplying uterine fibroids was achieved with microsphere particle embolization. The patient was admitted for overnight observation for treatment of post embolization symptoms. Electronically Signed   By: Aletta Edouard M.D.   On: 12/22/2018 17:34   Ir Angiogram Selective Each Additional Vessel  Result Date: 12/22/2018 CLINICAL DATA:  Symptomatic uterine fibroids with menorrhagia and dysmenorrhea. EXAM: 1. ULTRASOUND GUIDANCE FOR VASCULAR ACCESS OF THE RIGHT COMMON FEMORAL ARTERY 2. SELECTIVE PELVIC ARTERIOGRAPHY OF THE LEFT INTERNAL ILIAC ARTERY 3. ADDITIONAL SELECTIVE ARTERIOGRAPHY OF THE LEFT UTERINE ARTERY 4. TRANSCATHETER EMBOLIZATION OF THE LEFT UTERINE ARTERY TO TREAT UTERINE FIBROIDS 5. SELECTIVE PELVIC ARTERIOGRAPHY OF THE RIGHT INTERNAL ILIAC ARTERY  6. ADDITIONAL SELECTIVE ARTERIOGRAPHY OF THE RIGHT UTERINE ARTERY 7. TRANSCATHETER EMBOLIZATION OF THE RIGHT UTERINE ARTERY TO TREAT UTERINE FIBROIDS ANESTHESIA/SEDATION: Moderate (conscious) sedation was employed during this procedure. A total of Versed 6.0 mg and Fentanyl 200 mcg was administered intravenously. Moderate Sedation Time: 58 minutes. The patient's level of consciousness and vital signs were monitored continuously by radiology nursing  throughout the procedure under my direct supervision. MEDICATIONS: 2 g IV Ancef, 30 mg IV Toradol FLUOROSCOPY TIME:  17 minutes and 6 seconds.  1505 mGy. CONTRAST:  114 mL Omnipaque 300 PROCEDURE: The procedure, risks, benefits, and alternatives were explained to the patient. Questions regarding the procedure were encouraged and answered. The patient understands and consents to the procedure. A time-out was performed prior to initiating the procedure. The right groin was prepped with chlorhexidine in a sterile fashion, and a sterile drape was applied covering the operative field. A sterile gown and sterile gloves were used for the procedure. Local anesthesia was provided with 1% Lidocaine. Ultrasound was used to confirm patency of the right common femoral artery. After a small skin incision, a 21 gauge needle was advanced into the right common femoral artery under direct ultrasound guidance. Ultrasound image documentation was performed. After establishing guide wire access, a 5-French sheath was placed. A 5 Fr diagnostic catheter was advanced over a guidewire into the distal abdominal aorta. The catheter was then used to selectively catheterize the left common iliac artery followed by the left internal iliac artery. Selective arteriography was performed of the left internal iliac artery. A 2.8 Fr coaxial microcatheter was then introduced through the diagnostic catheter and advanced into the left uterine artery over a guide wire utilizing road mapping technique. Selective arteriography of the left uterine artery was performed through the microcatheter. Left uterine artery embolization was then performed with installation of microsphere particles. Follow-up arteriography was performed after embolization. The microcatheter was removed. The diagnostic catheter was then retracted and used to selectively catheterize the right internal iliac artery. Selective arteriography was performed of the right internal iliac artery.  The microcatheter was then reintroduced and advanced into the right uterine artery over a guidewire utilizing road mapping technique. Selective arteriography of the right uterine artery was then performed through the microcatheter. Right uterine artery embolization was then performed with installation of microsphere particles. Follow-up arteriography was performed after embolization. Right femoral arteriotomy hemostasis: Cordis ExoSeal COMPLICATIONS: None. FINDINGS: Bilateral uterine arteriography shows multiple enlarged hypervascular trunks supplying uterine fibroids. Left uterine artery embolization was performed utilizing 1.5 vials of 500-700 micron sized Embosphere particles. Completion arteriography demonstrates adequate occlusion of branches supplying uterine fibroids. Right uterine artery embolization was performed utilizing 1.5 vials of 500-700 micron sized Embosphere particles. Completion arteriography demonstrates adequate occlusion of branches supplying uterine fibroids. Adequate hemostasis was achieved at the femoral arteriotomy site. IMPRESSION: Successful bilateral uterine artery embolization to treat symptomatic uterine fibroid disease. Adequate occlusion of branch vessels supplying uterine fibroids was achieved with microsphere particle embolization. The patient was admitted for overnight observation for treatment of post embolization symptoms. Electronically Signed   By: Aletta Edouard M.D.   On: 12/22/2018 17:34   Ir Angiogram Selective Each Additional Vessel  Result Date: 12/22/2018 CLINICAL DATA:  Symptomatic uterine fibroids with menorrhagia and dysmenorrhea. EXAM: 1. ULTRASOUND GUIDANCE FOR VASCULAR ACCESS OF THE RIGHT COMMON FEMORAL ARTERY 2. SELECTIVE PELVIC ARTERIOGRAPHY OF THE LEFT INTERNAL ILIAC ARTERY 3. ADDITIONAL SELECTIVE ARTERIOGRAPHY OF THE LEFT UTERINE ARTERY 4. TRANSCATHETER EMBOLIZATION OF THE LEFT UTERINE ARTERY TO TREAT UTERINE  FIBROIDS 5. SELECTIVE PELVIC ARTERIOGRAPHY OF  THE RIGHT INTERNAL ILIAC ARTERY 6. ADDITIONAL SELECTIVE ARTERIOGRAPHY OF THE RIGHT UTERINE ARTERY 7. TRANSCATHETER EMBOLIZATION OF THE RIGHT UTERINE ARTERY TO TREAT UTERINE FIBROIDS ANESTHESIA/SEDATION: Moderate (conscious) sedation was employed during this procedure. A total of Versed 6.0 mg and Fentanyl 200 mcg was administered intravenously. Moderate Sedation Time: 58 minutes. The patient's level of consciousness and vital signs were monitored continuously by radiology nursing throughout the procedure under my direct supervision. MEDICATIONS: 2 g IV Ancef, 30 mg IV Toradol FLUOROSCOPY TIME:  17 minutes and 6 seconds.  1505 mGy. CONTRAST:  114 mL Omnipaque 300 PROCEDURE: The procedure, risks, benefits, and alternatives were explained to the patient. Questions regarding the procedure were encouraged and answered. The patient understands and consents to the procedure. A time-out was performed prior to initiating the procedure. The right groin was prepped with chlorhexidine in a sterile fashion, and a sterile drape was applied covering the operative field. A sterile gown and sterile gloves were used for the procedure. Local anesthesia was provided with 1% Lidocaine. Ultrasound was used to confirm patency of the right common femoral artery. After a small skin incision, a 21 gauge needle was advanced into the right common femoral artery under direct ultrasound guidance. Ultrasound image documentation was performed. After establishing guide wire access, a 5-French sheath was placed. A 5 Fr diagnostic catheter was advanced over a guidewire into the distal abdominal aorta. The catheter was then used to selectively catheterize the left common iliac artery followed by the left internal iliac artery. Selective arteriography was performed of the left internal iliac artery. A 2.8 Fr coaxial microcatheter was then introduced through the diagnostic catheter and advanced into the left uterine artery over a guide wire utilizing  road mapping technique. Selective arteriography of the left uterine artery was performed through the microcatheter. Left uterine artery embolization was then performed with installation of microsphere particles. Follow-up arteriography was performed after embolization. The microcatheter was removed. The diagnostic catheter was then retracted and used to selectively catheterize the right internal iliac artery. Selective arteriography was performed of the right internal iliac artery. The microcatheter was then reintroduced and advanced into the right uterine artery over a guidewire utilizing road mapping technique. Selective arteriography of the right uterine artery was then performed through the microcatheter. Right uterine artery embolization was then performed with installation of microsphere particles. Follow-up arteriography was performed after embolization. Right femoral arteriotomy hemostasis: Cordis ExoSeal COMPLICATIONS: None. FINDINGS: Bilateral uterine arteriography shows multiple enlarged hypervascular trunks supplying uterine fibroids. Left uterine artery embolization was performed utilizing 1.5 vials of 500-700 micron sized Embosphere particles. Completion arteriography demonstrates adequate occlusion of branches supplying uterine fibroids. Right uterine artery embolization was performed utilizing 1.5 vials of 500-700 micron sized Embosphere particles. Completion arteriography demonstrates adequate occlusion of branches supplying uterine fibroids. Adequate hemostasis was achieved at the femoral arteriotomy site. IMPRESSION: Successful bilateral uterine artery embolization to treat symptomatic uterine fibroid disease. Adequate occlusion of branch vessels supplying uterine fibroids was achieved with microsphere particle embolization. The patient was admitted for overnight observation for treatment of post embolization symptoms. Electronically Signed   By: Aletta Edouard M.D.   On: 12/22/2018 17:34   Ir  US Guide Vasc Access Right  Result Date: 12/22/2018 CLINICAL DATA:  Symptomatic uterine fibroids with menorrhagia and dysmenorrhea. EXAM: 1. ULTRASOUND GUIDANCE FOR VASCULAR ACCESS OF THE RIGHT COMMON FEMORAL ARTERY 2. SELECTIVE PELVIC ARTERIOGRAPHY OF THE LEFT INTERNAL ILIAC ARTERY 3. ADDITIONAL SELECTIVE ARTERIOGRAPHY OF THE LEFT UTERINE  ARTERY 4. TRANSCATHETER EMBOLIZATION OF THE LEFT UTERINE ARTERY TO TREAT UTERINE FIBROIDS 5. SELECTIVE PELVIC ARTERIOGRAPHY OF THE RIGHT INTERNAL ILIAC ARTERY 6. ADDITIONAL SELECTIVE ARTERIOGRAPHY OF THE RIGHT UTERINE ARTERY 7. TRANSCATHETER EMBOLIZATION OF THE RIGHT UTERINE ARTERY TO TREAT UTERINE FIBROIDS ANESTHESIA/SEDATION: Moderate (conscious) sedation was employed during this procedure. A total of Versed 6.0 mg and Fentanyl 200 mcg was administered intravenously. Moderate Sedation Time: 58 minutes. The patient's level of consciousness and vital signs were monitored continuously by radiology nursing throughout the procedure under my direct supervision. MEDICATIONS: 2 g IV Ancef, 30 mg IV Toradol FLUOROSCOPY TIME:  17 minutes and 6 seconds.  1505 mGy. CONTRAST:  114 mL Omnipaque 300 PROCEDURE: The procedure, risks, benefits, and alternatives were explained to the patient. Questions regarding the procedure were encouraged and answered. The patient understands and consents to the procedure. A time-out was performed prior to initiating the procedure. The right groin was prepped with chlorhexidine in a sterile fashion, and a sterile drape was applied covering the operative field. A sterile gown and sterile gloves were used for the procedure. Local anesthesia was provided with 1% Lidocaine. Ultrasound was used to confirm patency of the right common femoral artery. After a small skin incision, a 21 gauge needle was advanced into the right common femoral artery under direct ultrasound guidance. Ultrasound image documentation was performed. After establishing guide wire access, a  5-French sheath was placed. A 5 Fr diagnostic catheter was advanced over a guidewire into the distal abdominal aorta. The catheter was then used to selectively catheterize the left common iliac artery followed by the left internal iliac artery. Selective arteriography was performed of the left internal iliac artery. A 2.8 Fr coaxial microcatheter was then introduced through the diagnostic catheter and advanced into the left uterine artery over a guide wire utilizing road mapping technique. Selective arteriography of the left uterine artery was performed through the microcatheter. Left uterine artery embolization was then performed with installation of microsphere particles. Follow-up arteriography was performed after embolization. The microcatheter was removed. The diagnostic catheter was then retracted and used to selectively catheterize the right internal iliac artery. Selective arteriography was performed of the right internal iliac artery. The microcatheter was then reintroduced and advanced into the right uterine artery over a guidewire utilizing road mapping technique. Selective arteriography of the right uterine artery was then performed through the microcatheter. Right uterine artery embolization was then performed with installation of microsphere particles. Follow-up arteriography was performed after embolization. Right femoral arteriotomy hemostasis: Cordis ExoSeal COMPLICATIONS: None. FINDINGS: Bilateral uterine arteriography shows multiple enlarged hypervascular trunks supplying uterine fibroids. Left uterine artery embolization was performed utilizing 1.5 vials of 500-700 micron sized Embosphere particles. Completion arteriography demonstrates adequate occlusion of branches supplying uterine fibroids. Right uterine artery embolization was performed utilizing 1.5 vials of 500-700 micron sized Embosphere particles. Completion arteriography demonstrates adequate occlusion of branches supplying uterine  fibroids. Adequate hemostasis was achieved at the femoral arteriotomy site. IMPRESSION: Successful bilateral uterine artery embolization to treat symptomatic uterine fibroid disease. Adequate occlusion of branch vessels supplying uterine fibroids was achieved with microsphere particle embolization. The patient was admitted for overnight observation for treatment of post embolization symptoms. Electronically Signed   By: Aletta Edouard M.D.   On: 12/22/2018 17:34   Ir Embo Tumor Organ Ischemia Infarct Inc Guide Roadmapping  Result Date: 12/22/2018 CLINICAL DATA:  Symptomatic uterine fibroids with menorrhagia and dysmenorrhea. EXAM: 1. ULTRASOUND GUIDANCE FOR VASCULAR ACCESS OF THE RIGHT COMMON FEMORAL ARTERY 2. SELECTIVE PELVIC ARTERIOGRAPHY  OF THE LEFT INTERNAL ILIAC ARTERY 3. ADDITIONAL SELECTIVE ARTERIOGRAPHY OF THE LEFT UTERINE ARTERY 4. TRANSCATHETER EMBOLIZATION OF THE LEFT UTERINE ARTERY TO TREAT UTERINE FIBROIDS 5. SELECTIVE PELVIC ARTERIOGRAPHY OF THE RIGHT INTERNAL ILIAC ARTERY 6. ADDITIONAL SELECTIVE ARTERIOGRAPHY OF THE RIGHT UTERINE ARTERY 7. TRANSCATHETER EMBOLIZATION OF THE RIGHT UTERINE ARTERY TO TREAT UTERINE FIBROIDS ANESTHESIA/SEDATION: Moderate (conscious) sedation was employed during this procedure. A total of Versed 6.0 mg and Fentanyl 200 mcg was administered intravenously. Moderate Sedation Time: 58 minutes. The patient's level of consciousness and vital signs were monitored continuously by radiology nursing throughout the procedure under my direct supervision. MEDICATIONS: 2 g IV Ancef, 30 mg IV Toradol FLUOROSCOPY TIME:  17 minutes and 6 seconds.  1505 mGy. CONTRAST:  114 mL Omnipaque 300 PROCEDURE: The procedure, risks, benefits, and alternatives were explained to the patient. Questions regarding the procedure were encouraged and answered. The patient understands and consents to the procedure. A time-out was performed prior to initiating the procedure. The right groin was prepped with  chlorhexidine in a sterile fashion, and a sterile drape was applied covering the operative field. A sterile gown and sterile gloves were used for the procedure. Local anesthesia was provided with 1% Lidocaine. Ultrasound was used to confirm patency of the right common femoral artery. After a small skin incision, a 21 gauge needle was advanced into the right common femoral artery under direct ultrasound guidance. Ultrasound image documentation was performed. After establishing guide wire access, a 5-French sheath was placed. A 5 Fr diagnostic catheter was advanced over a guidewire into the distal abdominal aorta. The catheter was then used to selectively catheterize the left common iliac artery followed by the left internal iliac artery. Selective arteriography was performed of the left internal iliac artery. A 2.8 Fr coaxial microcatheter was then introduced through the diagnostic catheter and advanced into the left uterine artery over a guide wire utilizing road mapping technique. Selective arteriography of the left uterine artery was performed through the microcatheter. Left uterine artery embolization was then performed with installation of microsphere particles. Follow-up arteriography was performed after embolization. The microcatheter was removed. The diagnostic catheter was then retracted and used to selectively catheterize the right internal iliac artery. Selective arteriography was performed of the right internal iliac artery. The microcatheter was then reintroduced and advanced into the right uterine artery over a guidewire utilizing road mapping technique. Selective arteriography of the right uterine artery was then performed through the microcatheter. Right uterine artery embolization was then performed with installation of microsphere particles. Follow-up arteriography was performed after embolization. Right femoral arteriotomy hemostasis: Cordis ExoSeal COMPLICATIONS: None. FINDINGS: Bilateral uterine  arteriography shows multiple enlarged hypervascular trunks supplying uterine fibroids. Left uterine artery embolization was performed utilizing 1.5 vials of 500-700 micron sized Embosphere particles. Completion arteriography demonstrates adequate occlusion of branches supplying uterine fibroids. Right uterine artery embolization was performed utilizing 1.5 vials of 500-700 micron sized Embosphere particles. Completion arteriography demonstrates adequate occlusion of branches supplying uterine fibroids. Adequate hemostasis was achieved at the femoral arteriotomy site. IMPRESSION: Successful bilateral uterine artery embolization to treat symptomatic uterine fibroid disease. Adequate occlusion of branch vessels supplying uterine fibroids was achieved with microsphere particle embolization. The patient was admitted for overnight observation for treatment of post embolization symptoms. Electronically Signed   By: Aletta Edouard M.D.   On: 12/22/2018 17:34    Labs:  CBC: Recent Labs    07/27/18 1459 08/01/18 0329 09/10/18 1014 12/22/18 1242  WBC 6.8 7.7 6.6 6.0  HGB 12.6  11.0* 12.5 15.2*  HCT 41.0 35.9* 39.8 45.4  PLT 279 223 329 225    COAGS: No results for input(s): INR, APTT in the last 8760 hours.  BMP: Recent Labs    07/27/18 1459 08/01/18 0329 09/28/18 1556 12/22/18 1242 12/22/18 1406 12/23/18 0450  NA 141 138  --  142  --  137  K 3.5 3.8  --  2.9* 3.0* 3.7  CL 106 103  --  107  --  103  CO2 26 26  --  27  --  23  GLUCOSE 86 129*  --  95  --  135*  BUN 18 8  --  9  --  5*  CALCIUM 9.1 8.1*  --  8.8*  --  8.6*  CREATININE 0.98 0.72 0.90 0.68  --  0.69  GFRNONAA >60 >60  --  >60  --  >60  GFRAA >60 >60  --  >60  --  >60     Assessment and Plan:  Rachel Warren is doing well two weeks after fibroid embolization with no signs of complication. I recommended a follow up MRI of the pelvis about 6-9 months after the procedure and will meet back with her to review findings and  results. In the meantime, I told her to call the clinic should she have any concerns or new symptoms.   Electronically Signed: Azzie Roup 01/05/2019, 9:00 AM     I spent a total of 15 Minutes in remote  clinical consultation, greater than 50% of which was counseling/coordinating care post uterine fibroid embolization.    Visit type: Audio only (telephone). Audio (no video) only due to patient's lack of internet/smartphone capability.   Alternative for in-person consultation at [facility], [address], [place], [place]. This visit type was conducted due to national recommendations for restrictions regarding the COVID-19 Pandemic (e.g. social distancing).  This format is felt to be most appropriate for this patient at this time.  All issues noted in this document were discussed and addressed.

## 2019-01-27 ENCOUNTER — Telehealth: Payer: Self-pay | Admitting: Obstetrics and Gynecology

## 2019-01-27 NOTE — Telephone Encounter (Signed)
Patient received a call from her pharmacy about the lupron injection. She wants to know if she is still coming  in for this after having procedure?

## 2019-01-27 NOTE — Telephone Encounter (Signed)
Spoke with patient. Patient is s/p [place] on 12/22/18. Advised depo-lupron Rx no longer needed. Patient states [facility] continues to call her to request refill. Advised patient RN will contact [facility] pharmacy to cancel RX. Patient verbalizes understanding.   Call placed to [facility] pharmacy at [phone], spoke with Catina, Rx for depo-lupron 11.25mg  IM cancelled.   Routing to Dr. Antony Blackbird.   Encounter closed.

## 2019-02-09 ENCOUNTER — Other Ambulatory Visit: Payer: Self-pay

## 2019-02-11 ENCOUNTER — Ambulatory Visit: Payer: BC Managed Care – PPO | Admitting: Obstetrics and Gynecology

## 2019-02-11 ENCOUNTER — Other Ambulatory Visit: Payer: Self-pay

## 2019-02-11 ENCOUNTER — Encounter: Payer: Self-pay | Admitting: Obstetrics and Gynecology

## 2019-02-11 VITALS — BP 116/78 | HR 80 | Temp 97.1°F | Resp 14 | Ht 68.5 in | Wt 253.0 lb

## 2019-02-11 DIAGNOSIS — Z01419 Encounter for gynecological examination (general) (routine) without abnormal findings: Secondary | ICD-10-CM

## 2019-02-11 NOTE — Progress Notes (Signed)
43 y.o. G4P0001 Married [place] American female here for annual exam.    Since uterine artery embolization on 12/22/18, has spotting with cycles since then. No more cramping and numbness in legs.  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   PCP: Grier Mitts, MD     Patient's last menstrual period was 01/26/2019.           Sexually active: Yes.    The current method of family planning is condoms most of the time.    Exercising: Yes.    physical therapy Smoker:  no  Health Maintenance: Pap:  01/22/18 Neg:Neg HR HPV History of abnormal Pap:  Yes, remote past.  MMG:  02/17/18 BIRADS 1 negative/density a TDaP:  01/14/18  Gardasil:   no HIV and Hep C: 01/22/18 Negative Screening Labs: PCP   reports that she quit smoking about 19 months ago. Her smoking use included cigarettes. She has never used smokeless tobacco. She reports current alcohol use of about 2.0 - 3.0 standard drinks of alcohol per week. She reports current drug use. Drug: Marijuana.  Past Medical History:  Diagnosis Date  . Anemia   . Arthritis   . Back pain    lower back   . Complication of anesthesia    age 6 had twilight anesthesia and woke up during surgery   . Depression   . Fatty liver 01/22/2016   Noted on MRI abd  . Fibroids   . Heavy periods   . History of gallstones   . History of thyroid nodule    Left  . Hypertension   . Irregular periods   . Obesity   . Pancreatic lesion   . Pinched nerve    in back  . Ventral hernia 12/26/2014   Midline noted on CT Abd pelvis    Past Surgical History:  Procedure Laterality Date  . benign breast cyst removed Left    age 47  . CHOLECYSTECTOMY    . INSERTION OF MESH N/A 02/27/2015   Procedure: INSERTION OF MESH;  Surgeon: Jackolyn Confer, MD;  Location: [facility];  Service: General;  Laterality: N/A;  . IR ANGIOGRAM PELVIS SELECTIVE OR SUPRASELECTIVE  12/22/2018  . IR ANGIOGRAM PELVIS SELECTIVE OR SUPRASELECTIVE  12/22/2018  . IR ANGIOGRAM SELECTIVE EACH  ADDITIONAL VESSEL  12/22/2018  . IR ANGIOGRAM SELECTIVE EACH ADDITIONAL VESSEL  12/22/2018  . IR EMBO TUMOR ORGAN ISCHEMIA INFARCT INC GUIDE ROADMAPPING  12/22/2018  . IR RADIOLOGIST EVAL & MGMT  09/17/2018  . IR RADIOLOGIST EVAL & MGMT  01/05/2019  . IR US GUIDE VASC ACCESS RIGHT  12/22/2018  . LAPAROSCOPIC ASSISTED VENTRAL HERNIA REPAIR N/A 02/27/2015   Procedure: LAPAROSCOPIC VENTRAL HERNIA REPAIR WITH MESH;  Surgeon: Jackolyn Confer, MD;  Location: [facility];  Service: General;  Laterality: N/A;  . THYROIDECTOMY, PARTIAL    . TOTAL HIP ARTHROPLASTY Right 11/28/2017   Procedure: RIGHT TOTAL HIP ARTHROPLASTY ANTERIOR APPROACH;  Surgeon: Mcarthur Rossetti, MD;  Location: [facility];  Service: Orthopedics;  Laterality: Right;  . TOTAL HIP ARTHROPLASTY Left 07/31/2018   Procedure: LEFT TOTAL HIP ARTHROPLASTY ANTERIOR APPROACH;  Surgeon: Mcarthur Rossetti, MD;  Location: [facility];  Service: Orthopedics;  Laterality: Left;  Failed Spinal    Current Outpatient Medications  Medication Sig Dispense Refill  . amLODipine-benazepril (LOTREL) 10-20 MG capsule Take 1 capsule by mouth once daily 90 capsule 0  . cyclobenzaprine (FLEXERIL) 10 MG tablet Take 1 tablet (10 mg total) by mouth at bedtime. (Patient taking  differently: Take 10 mg by mouth 3 (three) times daily as needed for muscle spasms. ) 30 tablet 1  . diclofenac sodium (VOLTAREN) 1 % GEL Apply 2 g topically 4 (four) times daily. (Patient taking differently: Apply 2 g topically 4 (four) times daily as needed (pain). ) 100 g 3  . DULoxetine (CYMBALTA) 30 MG capsule Take 1 capsule (30 mg total) by mouth 2 (two) times daily. 180 capsule 2  . Ferrous Sulfate (IRON) 325 (65 Fe) MG TABS Take 1 tablet by mouth once daily with breakfast 90 tablet 0  . naproxen sodium (ALEVE) 220 MG tablet Take 220 mg by mouth as needed.     No current facility-administered medications for this visit.     Family History  Problem Relation Age of Onset  . Hypertension Father    . Arthritis Father   . Clotting disorder Father   . Prostate cancer Paternal Grandfather     Review of Systems  Constitutional: Negative.   HENT: Negative.   Eyes: Negative.   Respiratory: Negative.   Cardiovascular: Negative.   Gastrointestinal: Negative.   Endocrine: Negative.   Genitourinary: Negative.   Musculoskeletal: Negative.   Skin: Negative.   Allergic/Immunologic: Negative.   Neurological: Negative.   Hematological: Negative.   Psychiatric/Behavioral: Negative.     Exam:   BP 116/78 (BP Location: Right Arm, Patient Position: Sitting, Cuff Size: Large)   Pulse 80   Temp (!) 97.1 F (36.2 C) (Temporal)   Resp 14   Ht 5' 8.5" (1.74 m)   Wt 253 lb (114.8 kg)   LMP 01/26/2019 Comment: spotting  BMI 37.91 kg/m     General appearance: alert, cooperative and appears stated age Head: normocephalic, without obvious abnormality, atraumatic Neck: no adenopathy, supple, symmetrical, trachea midline and thyroid normal to inspection and palpation Lungs: clear to auscultation bilaterally Breasts: normal appearance, no masses or tenderness, No nipple retraction or dimpling, No nipple discharge or bleeding, No axillary adenopathy Heart: regular rate and rhythm Abdomen: soft, non-tender; no masses, no organomegaly Extremities: extremities normal, atraumatic, no cyanosis or edema Skin: skin color, texture, turgor normal. No rashes or lesions Lymph nodes: cervical, supraclavicular, and axillary nodes normal. Neurologic: grossly normal  Pelvic: External genitalia:  no lesions              No abnormal inguinal nodes palpated.              Urethra:  normal appearing urethra with no masses, tenderness or lesions              Bartholins and Skenes: normal                 Vagina: normal appearing vagina with normal color and discharge, no lesions              Cervix: no lesions              Pap taken: Yes.   Bimanual Exam:  Uterus:  13 week size uterus, mobile, nontender.                Adnexa: no mass, fullness, tenderness              Rectal exam: Yes.  .  Confirms.              Anus:  normal sphincter tone, no lesions  Chaperone was present for exam.  Assessment:   Well woman visit with normal exam. Status post uterine artery embolization.  Pancreatic pseudocysts.  Ventral hernia repair with mesh.  Status post partial thyroidectomy.   Plan: Mammogram screening discussed. Self breast awareness reviewed. Pap and HR HPV as above. Guidelines for Calcium, Vitamin D, regular exercise program including cardiovascular and weight bearing exercise. Labs with PCP.  I emphasized the need to use condoms for pregnancy prevention.  Follow up annually and prn.   After visit summary provided.

## 2019-02-11 NOTE — Patient Instructions (Signed)

## 2019-02-18 ENCOUNTER — Ambulatory Visit: Payer: BC Managed Care – PPO | Admitting: Orthopaedic Surgery

## 2019-02-18 ENCOUNTER — Ambulatory Visit: Payer: BC Managed Care – PPO | Admitting: Physician Assistant

## 2019-02-19 ENCOUNTER — Encounter: Payer: Self-pay | Admitting: Family Medicine

## 2019-02-22 ENCOUNTER — Ambulatory Visit: Payer: Self-pay | Admitting: Family Medicine

## 2019-02-22 NOTE — Telephone Encounter (Signed)
Pt called in c/o having an elevated BP.   She also mentioned she has had some chest pain on the left side and under her left breast and left shoulder.   She felt lightheaded. She did admit to not taking her BP medication daily.    I referred her to the ED due to the chest pain and light headedness.   She agreed to go to [facility] ED.  I sent my notes to Dr. Volanda Napoleon so she would be aware of the ED referral.  Reason for Disposition . [6] Systolic BP  >= 269 OR Diastolic >= 485 AND [4] cardiac or neurologic symptoms (e.g., chest pain, difficulty breathing, unsteady gait, blurred vision)    Had chest pain and light headedness along with elevated BP  Answer Assessment - Initial Assessment Questions 1. BLOOD PRESSURE: "What is the blood pressure?" "Did you take at least two measurements 5 minutes apart?"     I'm in an meeting now.   Yesterday 9:00 PM BP 121/88.   116/72 during the day it was fine. Friday and Saturday 146/102,  160/88. 2. ONSET: "When did you take your blood pressure?"     9:00 PM last night.      Saturday night I felt like I was going to pass out and was dizzy.     My chest was hurting on left side under my breast.   No shortness of breath.    Friday in PT I was on a tread mill I felt light headed.   My BP was high. 3. HOW: "How did you obtain the blood pressure?" (e.g., visiting nurse, automatic home BP monitor)     I have a machine at home.    No chest pain this morning.   4. HISTORY: "Do you have a history of high blood pressure?"     I take BP medication   So yes 5. MEDICATIONS: "Are you taking any medications for blood pressure?" "Have you missed any doses recently?"     I've not been taking my BP medication.   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    I've not taken it on a regular basis.    When I take my medication daily my BP is fine. 6. OTHER SYMPTOMS: "Do you have any symptoms?" (e.g., headache, chest pain, blurred vision, difficulty breathing,  weakness)     Chest pain, no headaches, no shortness of breath, no nausea, light headed and weak.   7. PREGNANCY: "Is there any chance you are pregnant?" "When was your last menstrual period?"     No  Protocols used: HIGH BLOOD PRESSURE-A-AH

## 2019-02-22 NOTE — Telephone Encounter (Signed)
See note

## 2019-02-22 NOTE — Telephone Encounter (Signed)
Will monitor for ED arrival.  

## 2019-02-23 ENCOUNTER — Emergency Department (HOSPITAL_COMMUNITY)
Admission: EM | Admit: 2019-02-23 | Discharge: 2019-02-23 | Disposition: A | Discharge status: Home / Self Care | Payer: BC Managed Care – PPO | Attending: Emergency Medicine | Admitting: Emergency Medicine

## 2019-02-23 ENCOUNTER — Encounter (HOSPITAL_COMMUNITY): Payer: Self-pay

## 2019-02-23 ENCOUNTER — Encounter: Payer: Self-pay | Admitting: Obstetrics and Gynecology

## 2019-02-23 ENCOUNTER — Other Ambulatory Visit: Payer: Self-pay

## 2019-02-23 ENCOUNTER — Emergency Department (HOSPITAL_COMMUNITY): Payer: BC Managed Care – PPO

## 2019-02-23 ENCOUNTER — Telehealth: Payer: Self-pay | Admitting: *Deleted

## 2019-02-23 DIAGNOSIS — Z20828 Contact with and (suspected) exposure to other viral communicable diseases: Secondary | ICD-10-CM | POA: Insufficient documentation

## 2019-02-23 DIAGNOSIS — I10 Essential (primary) hypertension: Secondary | ICD-10-CM | POA: Diagnosis not present

## 2019-02-23 DIAGNOSIS — E876 Hypokalemia: Secondary | ICD-10-CM | POA: Diagnosis not present

## 2019-02-23 DIAGNOSIS — Z87891 Personal history of nicotine dependence: Secondary | ICD-10-CM | POA: Insufficient documentation

## 2019-02-23 DIAGNOSIS — Z79899 Other long term (current) drug therapy: Secondary | ICD-10-CM | POA: Diagnosis not present

## 2019-02-23 DIAGNOSIS — R079 Chest pain, unspecified: Secondary | ICD-10-CM | POA: Diagnosis present

## 2019-02-23 LAB — CBC WITH DIFFERENTIAL/PLATELET
Abs Immature Granulocytes: 0.02 10*3/uL (ref 0.00–0.07)
Basophils Absolute: 0 10*3/uL (ref 0.0–0.1)
Basophils Relative: 0 %
Eosinophils Absolute: 0 10*3/uL (ref 0.0–0.5)
Eosinophils Relative: 0 %
HCT: 46.4 % — ABNORMAL HIGH (ref 36.0–46.0)
Hemoglobin: 15.4 g/dL — ABNORMAL HIGH (ref 12.0–15.0)
Immature Granulocytes: 0 %
Lymphocytes Relative: 27 %
Lymphs Abs: 1.5 10*3/uL (ref 0.7–4.0)
MCH: 30.1 pg (ref 26.0–34.0)
MCHC: 33.2 g/dL (ref 30.0–36.0)
MCV: 90.8 fL (ref 80.0–100.0)
Monocytes Absolute: 0.3 10*3/uL (ref 0.1–1.0)
Monocytes Relative: 6 %
Neutro Abs: 3.7 10*3/uL (ref 1.7–7.7)
Neutrophils Relative %: 67 %
Platelets: 238 10*3/uL (ref 150–400)
RBC: 5.11 MIL/uL (ref 3.87–5.11)
RDW: 13.7 % (ref 11.5–15.5)
WBC: 5.5 10*3/uL (ref 4.0–10.5)
nRBC: 0 % (ref 0.0–0.2)

## 2019-02-23 LAB — I-STAT BETA HCG BLOOD, ED (NOT ORDERABLE): I-stat hCG, quantitative: <5 m[IU]/mL (ref <5)

## 2019-02-23 LAB — BASIC METABOLIC PANEL
Anion gap: 7 (ref 5–15)
BUN: 8 mg/dL (ref 6–20)
CO2: 28 mmol/L (ref 22–32)
Calcium: 8.6 mg/dL — ABNORMAL LOW (ref 8.9–10.3)
Chloride: 106 mmol/L (ref 98–111)
Creatinine, Ser: 0.71 mg/dL (ref 0.44–1.00)
GFR calc Af Amer: >60 mL/min (ref >60)
GFR calc non Af Amer: >60 mL/min (ref >60)
Glucose, Bld: 99 mg/dL (ref 70–99)
Potassium: 3.1 mmol/L — ABNORMAL LOW (ref 3.5–5.1)
Sodium: 141 mmol/L (ref 135–145)

## 2019-02-23 LAB — TROPONIN I (HIGH SENSITIVITY): Troponin I (High Sensitivity): 3 ng/L (ref <18)

## 2019-02-23 MED ORDER — POTASSIUM CHLORIDE CRYS ER 20 MEQ PO TBCR
40.0000 meq | EXTENDED_RELEASE_TABLET | Freq: Once | ORAL | Status: AC
  Administered 2019-02-23: 40 meq via ORAL
  Filled 2019-02-23: qty 2

## 2019-02-23 MED ORDER — SODIUM CHLORIDE 0.9% FLUSH: 3.0000 mL | Freq: Once | INTRAVENOUS | Status: DC

## 2019-02-23 MED ORDER — POTASSIUM CHLORIDE CRYS ER 20 MEQ PO TBCR: 20.0000 meq | EXTENDED_RELEASE_TABLET | Freq: Two times a day (BID) | ORAL | 0 refills | Status: DC

## 2019-02-23 NOTE — Telephone Encounter (Signed)
Patient has arrived to ED today

## 2019-02-23 NOTE — Telephone Encounter (Signed)
Copied from [place] [id number]. Topic: General - Other >> Feb 23, 2019  2:26 PM Leward Quan A wrote: Reason for CRM: Patient called to inform Dr Volanda Napoleon that she went to the hospital and had a full cardio work up. She would like to know what the next step is, asking for a call back at Ph# [phone]  Scheduled patient an ED f/u appointment with PCP.

## 2019-02-23 NOTE — ED Triage Notes (Signed)
Pt reports she began having lightheadedness on Friday, Saturday developed L sided chest pain that went away Sunday and then developed L arm pain Monday. Pt denies any SOB or N/V. Pt did report calling her PCP due to increased BP and PCP told pt to come to ED and be evaluated, hx of HTN and is intermitting non-compliant with medications.

## 2019-02-23 NOTE — ED Provider Notes (Signed)
[facility] DEPT Provider Note   CSN: [id number] Arrival date & time: 02/23/19  0093    History   Chief Complaint Chief Complaint  Patient presents with  . Chest Pain    HPI Rachel Warren is a 43 y.o. female.     HPI  Rachel Warren is a 43 y.o. female, with a history of HTN and obesity, presenting to the ED primarily with concerns about her blood pressure. States she began to feel lightheaded on August 7.  She noticed her blood pressure was higher than normal in the range of 146/100.  August 8 she no longer had lightheadedness, but she experienced chest pain, described as a pressure, left-sided, moderate in intensity, nonexertional, no change with deep breathing, nonradiating. When she awoke August 9 she no longer had any symptoms and has continued to be symptom-free.  She spoke with her doctor's office today and was told to come to the ED.  She admits to intermittently taking her blood pressure medications.  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  Patient denies contact with known or suspected COVID-19 patients, however, she does live with family members who work in the healthcare field.  Denies fever/chills, syncope, shortness of breath, cough, abdominal pain, dizziness, N/V/D, or any other complaints.    Past Medical History:  Diagnosis Date  . Anemia   . Arthritis   . Back pain    lower back   . Complication of anesthesia    age 32 had twilight anesthesia and woke up during surgery   . Depression   . Fatty liver 01/22/2016   Noted on MRI abd  . Fibroids   . Heavy periods   . History of gallstones   . History of thyroid nodule    Left  . Hypertension   . Irregular periods   . Obesity   . Pancreatic lesion   . Pinched nerve    in back  . Ventral hernia 12/26/2014   Midline noted on CT Abd pelvis    Patient Active Problem List   Diagnosis Date Noted  . 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 Fibroids 01/22/2018  . 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 Unilateral primary osteoarthritis, left hip 11/06/2017  . Unilateral primary osteoarthritis, right hip 11/06/2017  . Lumbosacral radiculopathy at S1 09/12/2017  . Back pain 08/19/2017  . 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  . Abnormal gait 08/19/2017  . Obesity 08/19/2017  . Ventral hernia s/p laparoscopic repair with mesh 02/27/15 02/27/2015    Past Surgical History:  Procedure Laterality Date  . benign breast cyst removed Left    age 79  . CHOLECYSTECTOMY    . INSERTION OF MESH N/A 02/27/2015   Procedure: INSERTION OF MESH;  Surgeon: Jackolyn Confer, MD;  Location: [facility];  Service: General;  Laterality: N/A;  . IR ANGIOGRAM PELVIS SELECTIVE OR SUPRASELECTIVE  12/22/2018  . IR ANGIOGRAM PELVIS SELECTIVE OR SUPRASELECTIVE  12/22/2018  . IR ANGIOGRAM SELECTIVE EACH ADDITIONAL VESSEL  12/22/2018  . IR ANGIOGRAM SELECTIVE EACH ADDITIONAL VESSEL  12/22/2018  . IR EMBO TUMOR ORGAN ISCHEMIA INFARCT INC GUIDE ROADMAPPING  12/22/2018  . IR RADIOLOGIST EVAL & MGMT  09/17/2018  . IR RADIOLOGIST EVAL & MGMT  01/05/2019  . IR US GUIDE VASC ACCESS RIGHT  12/22/2018  . LAPAROSCOPIC ASSISTED VENTRAL HERNIA REPAIR N/A 02/27/2015   Procedure: LAPAROSCOPIC  VENTRAL HERNIA REPAIR WITH MESH;  Surgeon: Jackolyn Confer, MD;  Location: [facility];  Service: General;  Laterality: N/A;  . THYROIDECTOMY, PARTIAL    . TOTAL HIP ARTHROPLASTY Right 11/28/2017   Procedure: RIGHT TOTAL HIP ARTHROPLASTY ANTERIOR APPROACH;  Surgeon: Mcarthur Rossetti, MD;  Location: [facility];  Service: Orthopedics;  Laterality: Right;  . TOTAL HIP ARTHROPLASTY Left 07/31/2018   Procedure: LEFT TOTAL HIP ARTHROPLASTY ANTERIOR APPROACH;  Surgeon: Mcarthur Rossetti, MD;  Location: [facility];  Service: Orthopedics;  Laterality: Left;  Failed Spinal     OB History    Gravida  1   Para  1    Term  0   Preterm  0   AB  0   Living  1     SAB  0   TAB  0   Ectopic  0   Multiple  0   Live Births  0            Home Medications    Prior to Admission medications   Medication Sig Start Date End Date Taking? Authorizing Provider  amLODipine-benazepril (LOTREL) 10-20 MG capsule Take 1 capsule by mouth once daily 11/26/18  Yes Billie Ruddy, MD  diclofenac sodium (VOLTAREN) 1 % GEL Apply 2 g topically 4 (four) times daily. Patient taking differently: Apply 2 g topically 4 (four) times daily as needed (pain).  11/19/18  Yes Mcarthur Rossetti, MD  DULoxetine (CYMBALTA) 30 MG capsule Take 1 capsule (30 mg total) by mouth 2 (two) times daily. 09/30/18  Yes Billie Ruddy, MD  Ferrous Sulfate (IRON) 325 (65 Fe) MG TABS Take 1 tablet by mouth once daily with breakfast Patient taking differently: Take 325 mg by mouth daily.  12/24/18  Yes Billie Ruddy, MD  naproxen sodium (ALEVE) 220 MG tablet Take 220 mg by mouth as needed (pain/headache).    Yes [provider]  cyclobenzaprine (FLEXERIL) 10 MG tablet Take 1 tablet (10 mg total) by mouth at bedtime. Patient not taking: Reported on 02/23/2019 10/20/18   Pete Pelt, PA-C  potassium chloride SA (K-DUR) 20 MEQ tablet Take 1 tablet (20 mEq total) by mouth 2 (two) times daily for 5 days. Take with meals and a full glass of water. 02/23/19 02/28/19  Lorayne Bender, PA-C    Family History Family History  Problem Relation Age of Onset  . Hypertension Father   . Arthritis Father   . Clotting disorder Father   . Prostate cancer Paternal Grandfather     Social History Social History   Tobacco Use  . Smoking status: Former Smoker    Types: Cigarettes    Quit date: 06/14/2017    Years since quitting: 1.6  . Smokeless tobacco: Never Used  Substance Use Topics  . Alcohol use: Yes    Alcohol/week: 2.0 - 3.0 standard drinks    Types: 2 - 3 Glasses of wine per week  . Drug use: Yes    Types: Marijuana     Comment: last time 07/08/2018     Allergies   Patient has no known allergies.   Review of Systems Review of Systems  Constitutional: Negative for chills, diaphoresis and fever.  Respiratory: Negative for cough and shortness of breath.   Cardiovascular: Positive for chest pain (resolved).  Gastrointestinal: Negative for abdominal pain, diarrhea, nausea and vomiting.  Musculoskeletal: Negative for back pain.  Neurological: Positive for light-headedness (resolved). Negative for dizziness, syncope, weakness, numbness and headaches.  All other  systems reviewed and are negative.    Physical Exam Updated Vital Signs BP (!) 149/100 (BP Location: Left Arm)   Pulse 73   Temp 98.6 F (37 C) (Oral)   Resp 16   Ht 5\' 8"  (1.727 m)   Wt 115.2 kg   LMP 01/26/2019 Comment: spotting  SpO2 98%   BMI 38.62 kg/m   Physical Exam Vitals signs and nursing note reviewed.  Constitutional:      General: She is not in acute distress.    Appearance: She is well-developed. She is not diaphoretic.  HENT:     Head: Normocephalic and atraumatic.     Mouth/Throat:     Mouth: Mucous membranes are moist.     Pharynx: Oropharynx is clear.  Eyes:     Conjunctiva/sclera: Conjunctivae normal.  Neck:     Musculoskeletal: Neck supple.  Cardiovascular:     Rate and Rhythm: Normal rate and regular rhythm.     Pulses: Normal pulses.          Radial pulses are 2+ on the right side and 2+ on the left side.       Posterior tibial pulses are 2+ on the right side and 2+ on the left side.     Heart sounds: Normal heart sounds.     Comments: Tactile temperature in the extremities appropriate and equal bilaterally. Pulmonary:     Effort: Pulmonary effort is normal. No respiratory distress.     Breath sounds: Normal breath sounds.  Abdominal:     Palpations: Abdomen is soft.     Tenderness: There is no abdominal tenderness. There is no guarding.  Musculoskeletal:     Right lower leg: No edema.     Left  lower leg: No edema.  Lymphadenopathy:     Cervical: No cervical adenopathy.  Skin:    General: Skin is warm and dry.  Neurological:     Mental Status: She is alert.  Psychiatric:        Mood and Affect: Mood and affect normal.        Speech: Speech normal.        Behavior: Behavior normal.      ED Treatments / Results  Labs (all labs ordered are listed, but only abnormal results are displayed) Labs Reviewed  BASIC METABOLIC PANEL - Abnormal; Notable for the following components:      Result Value   Potassium 3.1 (*)    Calcium 8.6 (*)    All other components within normal limits  CBC WITH DIFFERENTIAL/PLATELET - Abnormal; Notable for the following components:   Hemoglobin 15.4 (*)    HCT 46.4 (*)    All other components within normal limits  NOVEL CORONAVIRUS, NAA (HOSPITAL ORDER, SEND-OUT TO REF LAB)  I-STAT BETA HCG BLOOD, ED ([facility], [facility], [facility] ONLY)  I-STAT BETA HCG BLOOD, ED (NOT ORDERABLE)  TROPONIN I (HIGH SENSITIVITY)    EKG EKG Interpretation  Date/Time:  Tuesday February 23 2019 09:10:35 EDT Ventricular Rate:  67 PR Interval:    QRS Duration: 92 QT Interval:  417 QTC Calculation: 441 R Axis:   -38 Text Interpretation:  Sinus rhythm Inferior infarct, old Probable anterior infarct, age indeterminate Confirmed by Milton Ferguson ([id number]) on 02/23/2019 9:39:21 AM   Radiology Dg Chest 2 View  Result Date: 02/23/2019 CLINICAL DATA:  Chest pain. EXAM: CHEST - 2 VIEW COMPARISON:  Chest radiograph 07/27/2018 FINDINGS: Heart size within normal limits. No focal consolidation within the lungs. No evidence of pleural effusion  or pneumothorax. No acute bony abnormality. Surgical clips project over the right lower neck. IMPRESSION: No evidence of active cardiopulmonary disease. Electronically Signed   By: Kellie Simmering   On: 02/23/2019 09:49    Procedures Procedures (including critical care time)  Medications Ordered in ED Medications  potassium chloride SA (K-DUR) CR tablet  40 mEq (has no administration in time range)     Initial Impression / Assessment and Plan / ED Course  I have reviewed the triage vital signs and the nursing notes.  Pertinent labs & imaging results that were available during my care of the patient were reviewed by me and considered in my medical decision making (see chart for details).        Patient presents with concerns for her blood pressure.  She is asymptomatic and remained asymptomatic throughout her ED course. She has some mild hypokalemia, which was addressed.  She will follow-up with her PCP for retesting after supplementation.  Other lab results reassuring. Troponin negative.  Chest x-ray without acute abnormality. Nonspecific T wave abnormalities on EKG were not noted on previous EKG.  She will follow-up with cardiology on this matter. The patient was given instructions for home care as well as return precautions. Patient voices understanding of these instructions, accepts the plan, and is comfortable with discharge.   Findings and plan of care discussed with Verneda Skill, MD.    Vitals:   02/23/19 1030 02/23/19 1100 02/23/19 1130 02/23/19 1200  BP: 117/87 121/87 124/87 119/87  Pulse: 62  62   Resp: 13 20 19 17   Temp:      TempSrc:      SpO2: 100%  100%   Weight:      Height:         Final Clinical Impressions(s) / ED Diagnoses   Final diagnoses:  Hypertension, unspecified type  Hypokalemia    ED Discharge Orders         Ordered    potassium chloride SA (K-DUR) 20 MEQ tablet  2 times daily     02/23/19 Meg[place]el, Keerthi Hazell C, PA-C 02/23/19 1232    Milton Ferguson, MD 03/02/19 1305

## 2019-02-23 NOTE — Discharge Instructions (Addendum)
There was evidence of lower than normal potassium level today.  Please take the potassium supplement, as prescribed, until finished.  Have the potassium level retested through your primary care provider. Your other lab results were reassuring.  There were some nonspecific T wave changes noted on EKG today.  These were not noted on previous EKG.  It is recommended that you follow-up with cardiology on this matter.  Return to the emergency room for any recurrence of chest pain, onset of shortness of breath, dizziness, passing out, abdominal pain, or any other major concerns.

## 2019-02-23 NOTE — ED Notes (Signed)
ED Provider at bedside. 

## 2019-02-24 LAB — NOVEL CORONAVIRUS, NAA (HOSP ORDER, SEND-OUT TO REF LAB; TAT 18-24 HRS): SARS-CoV-2, NAA: NOT DETECTED

## 2019-02-26 ENCOUNTER — Telehealth (INDEPENDENT_AMBULATORY_CARE_PROVIDER_SITE_OTHER): Payer: BC Managed Care – PPO | Admitting: Family Medicine

## 2019-02-26 ENCOUNTER — Other Ambulatory Visit: Payer: Self-pay

## 2019-02-26 ENCOUNTER — Telehealth: Payer: Self-pay

## 2019-02-26 DIAGNOSIS — I10 Essential (primary) hypertension: Secondary | ICD-10-CM

## 2019-02-26 MED ORDER — AMLODIPINE BESYLATE 10 MG PO TABS: 10.0000 mg | ORAL_TABLET | Freq: Every day | ORAL | 3 refills | Status: DC

## 2019-02-26 MED ORDER — BENAZEPRIL HCL 40 MG PO TABS: 40.0000 mg | ORAL_TABLET | Freq: Every day | ORAL | 3 refills | Status: DC

## 2019-02-26 NOTE — Telephone Encounter (Signed)
NOTES ON FILE FROM ED SENT REFERRAL TO SCHEDULING

## 2019-02-26 NOTE — Progress Notes (Signed)
Virtual Visit via Video Note  I connected with Rachel Warren on 02/26/19 at  4:00 PM EDT by a video enabled telemedicine application 2/2 [id number] pandemic and verified that I am speaking with the correct person using two identifiers.  Location patient: home Location provider:work or home office Persons participating in the virtual visit: patient, provider  I discussed the limitations of evaluation and management by telemedicine and the availability of in person appointments. The patient expressed understanding and agreed to proceed.   HPI: Pt notes elevated bp for 1 wk or so.  Taking lotrel 10-20 mg daily.  States she missed a doses last wk. 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  Went to PT,  but was sent home 2/2 elevated bp.  After feeling dizzy pt went to the ED.  Potassium was 3.1. Pt given rx for Kdur but just started it  today.  Pt advised to take an extra dose while on visit with this provider.   BP at home 135/95, 134/97 120/81.  ROS: See pertinent positives and negatives per HPI.  Past Medical History:  Diagnosis Date  . Anemia   . Arthritis   . Back pain    lower back   . Complication of anesthesia    age 34 had twilight anesthesia and woke up during surgery   . Depression   . Fatty liver 01/22/2016   Noted on MRI abd  . Fibroids   . Heavy periods   . History of gallstones   . History of thyroid nodule    Left  . Hypertension   . Irregular periods   . Obesity   . Pancreatic lesion   . Pinched nerve    in back  . Ventral hernia 12/26/2014   Midline noted on CT Abd pelvis    Past Surgical History:  Procedure Laterality Date  . benign breast cyst removed Left    age 48  . CHOLECYSTECTOMY    . INSERTION OF MESH N/A 02/27/2015   Procedure: INSERTION OF MESH;  Surgeon: Jackolyn Confer, MD;  Location: [facility];  Service: General;  Laterality: N/A;  . IR ANGIOGRAM PELVIS SELECTIVE OR SUPRASELECTIVE  12/22/2018   . IR ANGIOGRAM PELVIS SELECTIVE OR SUPRASELECTIVE  12/22/2018  . IR ANGIOGRAM SELECTIVE EACH ADDITIONAL VESSEL  12/22/2018  . IR ANGIOGRAM SELECTIVE EACH ADDITIONAL VESSEL  12/22/2018  . IR EMBO TUMOR ORGAN ISCHEMIA INFARCT INC GUIDE ROADMAPPING  12/22/2018  . IR RADIOLOGIST EVAL & MGMT  09/17/2018  . IR RADIOLOGIST EVAL & MGMT  01/05/2019  . IR US GUIDE VASC ACCESS RIGHT  12/22/2018  . LAPAROSCOPIC ASSISTED VENTRAL HERNIA REPAIR N/A 02/27/2015   Procedure: LAPAROSCOPIC VENTRAL HERNIA REPAIR WITH MESH;  Surgeon: Jackolyn Confer, MD;  Location: [facility];  Service: General;  Laterality: N/A;  . THYROIDECTOMY, PARTIAL    . TOTAL HIP ARTHROPLASTY Right 11/28/2017   Procedure: RIGHT TOTAL HIP ARTHROPLASTY ANTERIOR APPROACH;  Surgeon: Mcarthur Rossetti, MD;  Location: [facility];  Service: Orthopedics;  Laterality: Right;  . TOTAL HIP ARTHROPLASTY Left 07/31/2018   Procedure: LEFT TOTAL HIP ARTHROPLASTY ANTERIOR APPROACH;  Surgeon: Mcarthur Rossetti, MD;  Location: [facility];  Service: Orthopedics;  Laterality: Left;  Failed Spinal    Family History  Problem Relation Age of Onset  . Hypertension Father   . Arthritis Father   . Clotting disorder Father   . Prostate cancer Paternal Grandfather      Current Outpatient Medications:  .  amLODipine-benazepril (LOTREL) 10-20 MG capsule, Take 1 capsule by mouth once daily, Disp: 90 capsule, Rfl: 0 .  cyclobenzaprine (FLEXERIL) 10 MG tablet, Take 1 tablet (10 mg total) by mouth at bedtime. (Patient not taking: Reported on 02/23/2019), Disp: 30 tablet, Rfl: 1 .  diclofenac sodium (VOLTAREN) 1 % GEL, Apply 2 g topically 4 (four) times daily. (Patient taking differently: Apply 2 g topically 4 (four) times daily as needed (pain). ), Disp: 100 g, Rfl: 3 .  DULoxetine (CYMBALTA) 30 MG capsule, Take 1 capsule (30 mg total) by mouth 2 (two) times daily., Disp: 180 capsule, Rfl: 2 .  Ferrous Sulfate (IRON) 325 (65 Fe) MG TABS, Take 1 tablet by mouth once daily with  breakfast (Patient taking differently: Take 325 mg by mouth daily. ), Disp: 90 tablet, Rfl: 0 .  naproxen sodium (ALEVE) 220 MG tablet, Take 220 mg by mouth as needed (pain/headache). , Disp: , Rfl:  .  potassium chloride SA (K-DUR) 20 MEQ tablet, Take 1 tablet (20 mEq total) by mouth 2 (two) times daily for 5 days. Take with meals and a full glass of water., Disp: 10 tablet, Rfl: 0  EXAM:  VITALS per patient if applicable:  GENERAL: alert, oriented, appears well and in no acute distress  HEENT: atraumatic, conjunctiva clear, no obvious abnormalities on inspection of external nose and ears  NECK: normal movements of the head and neck  LUNGS: on inspection no signs of respiratory distress, breathing rate appears normal, no obvious gross SOB, gasping or wheezing  CV: no obvious cyanosis  MS: moves all visible extremities without noticeable abnormality  PSYCH/NEURO: pleasant and cooperative, no obvious depression or anxiety, speech and thought processing grossly intact  ASSESSMENT AND PLAN:  Discussed the following assessment and plan:  Essential hypertension -elevated.  Possibly 2/2 increased stress. -continue lifestyle modifications -discussed ways to reduce stress. -will d/c lotrel combination pill.  Will adjust dose of bp meds.  If tolerated can send in rx for combination pill. -pt to check bp daily - Plan: amLODipine (NORVASC) 10 MG tablet, benazepril (LOTENSIN) 40 MG tablet -f/u in 2-4 wks  I discussed the assessment and treatment plan with the patient. The patient was provided an opportunity to ask questions and all were answered. The patient agreed with the plan and demonstrated an understanding of the instructions.   The patient was advised to call back or seek an in-person evaluation if the symptoms worsen or if the condition fails to improve as anticipated.  I provided 12 minutes of non-face-to-face time during this encounter.   Billie Ruddy, MD

## 2019-03-12 ENCOUNTER — Encounter: Payer: Self-pay | Admitting: Family Medicine

## 2019-03-12 ENCOUNTER — Other Ambulatory Visit: Payer: Self-pay

## 2019-03-12 ENCOUNTER — Ambulatory Visit: Payer: BC Managed Care – PPO | Admitting: Family Medicine

## 2019-03-12 ENCOUNTER — Ambulatory Visit (INDEPENDENT_AMBULATORY_CARE_PROVIDER_SITE_OTHER): Payer: BC Managed Care – PPO | Admitting: Family Medicine

## 2019-03-12 ENCOUNTER — Other Ambulatory Visit: Payer: BC Managed Care – PPO

## 2019-03-12 VITALS — BP 102/78 | HR 84

## 2019-03-12 DIAGNOSIS — I10 Essential (primary) hypertension: Secondary | ICD-10-CM | POA: Diagnosis not present

## 2019-03-12 DIAGNOSIS — E876 Hypokalemia: Secondary | ICD-10-CM

## 2019-03-12 MED ORDER — METOPROLOL TARTRATE 25 MG PO TABS: 25.0000 mg | ORAL_TABLET | Freq: Two times a day (BID) | ORAL | 3 refills | Status: DC

## 2019-03-12 NOTE — Progress Notes (Signed)
Virtual Visit via Video Note  I connected with Rachel Warren on 03/12/19 at  2:00 PM EDT by a video enabled telemedicine application 2/2 [id number] pandemic and verified that I am speaking with the correct person using two identifiers.  Location patient: home Location provider:work or home office Persons participating in the virtual visit: patient, provider  I discussed the limitations of evaluation and management by telemedicine and the availability of in person appointments. The patient expressed understanding and agreed to proceed.   HPI: Pt states bp is still up and down.  Taking Norvasc 10 mg and benazepril 40 mg.  Pt unable to return to PT until her bp is improved and she has a doctor's note.  BP this am before meds was  133/100.  Pt denies HAs, CP, changes in vision.  Pt is decreasing her stress level.  She gets to tasks at work as she can.   Repeat bp during e-visit remains high with P 93.  ROS: See pertinent positives and negatives per HPI.  Past Medical History:  Diagnosis Date  . Anemia   . Arthritis   . Back pain    lower back   . Complication of anesthesia    age 40 had twilight anesthesia and woke up during surgery   . Depression   . Fatty liver 01/22/2016   Noted on MRI abd  . Fibroids   . Heavy periods   . History of gallstones   . History of thyroid nodule    Left  . Hypertension   . Irregular periods   . Obesity   . Pancreatic lesion   . Pinched nerve    in back  . Ventral hernia 12/26/2014   Midline noted on CT Abd pelvis    Past Surgical History:  Procedure Laterality Date  . benign breast cyst removed Left    age 53  . CHOLECYSTECTOMY    . INSERTION OF MESH N/A 02/27/2015   Procedure: INSERTION OF MESH;  Surgeon: Jackolyn Confer, MD;  Location: [facility];  Service: General;  Laterality: N/A;  . IR ANGIOGRAM PELVIS SELECTIVE OR SUPRASELECTIVE  12/22/2018  . IR ANGIOGRAM PELVIS SELECTIVE OR SUPRASELECTIVE  12/22/2018  . IR ANGIOGRAM SELECTIVE EACH  ADDITIONAL VESSEL  12/22/2018  . IR ANGIOGRAM SELECTIVE EACH ADDITIONAL VESSEL  12/22/2018  . IR EMBO TUMOR ORGAN ISCHEMIA INFARCT INC GUIDE ROADMAPPING  12/22/2018  . IR RADIOLOGIST EVAL & MGMT  09/17/2018  . IR RADIOLOGIST EVAL & MGMT  01/05/2019  . IR US GUIDE VASC ACCESS RIGHT  12/22/2018  . LAPAROSCOPIC ASSISTED VENTRAL HERNIA REPAIR N/A 02/27/2015   Procedure: LAPAROSCOPIC VENTRAL HERNIA REPAIR WITH MESH;  Surgeon: Jackolyn Confer, MD;  Location: [facility];  Service: General;  Laterality: N/A;  . THYROIDECTOMY, PARTIAL    . TOTAL HIP ARTHROPLASTY Right 11/28/2017   Procedure: RIGHT TOTAL HIP ARTHROPLASTY ANTERIOR APPROACH;  Surgeon: Mcarthur Rossetti, MD;  Location: [facility];  Service: Orthopedics;  Laterality: Right;  . TOTAL HIP ARTHROPLASTY Left 07/31/2018   Procedure: LEFT TOTAL HIP ARTHROPLASTY ANTERIOR APPROACH;  Surgeon: Mcarthur Rossetti, MD;  Location: [facility];  Service: Orthopedics;  Laterality: Left;  Failed Spinal    Family History  Problem Relation Age of Onset  . Hypertension Father   . Arthritis Father   . Clotting disorder Father   . Prostate cancer Paternal Grandfather       Current Outpatient Medications:  .  amLODipine (NORVASC) 10 MG tablet, Take 1 tablet (10 mg total) by  mouth daily., Disp: 30 tablet, Rfl: 3 .  benazepril (LOTENSIN) 40 MG tablet, Take 1 tablet (40 mg total) by mouth daily., Disp: 30 tablet, Rfl: 3 .  cyclobenzaprine (FLEXERIL) 10 MG tablet, Take 1 tablet (10 mg total) by mouth at bedtime. (Patient not taking: Reported on 02/23/2019), Disp: 30 tablet, Rfl: 1 .  diclofenac sodium (VOLTAREN) 1 % GEL, Apply 2 g topically 4 (four) times daily. (Patient taking differently: Apply 2 g topically 4 (four) times daily as needed (pain). ), Disp: 100 g, Rfl: 3 .  DULoxetine (CYMBALTA) 30 MG capsule, Take 1 capsule (30 mg total) by mouth 2 (two) times daily., Disp: 180 capsule, Rfl: 2 .  Ferrous Sulfate (IRON) 325 (65 Fe) MG TABS, Take 1 tablet by mouth once daily  with breakfast (Patient taking differently: Take 325 mg by mouth daily. ), Disp: 90 tablet, Rfl: 0 .  naproxen sodium (ALEVE) 220 MG tablet, Take 220 mg by mouth as needed (pain/headache). , Disp: , Rfl:  .  potassium chloride SA (K-DUR) 20 MEQ tablet, Take 1 tablet (20 mEq total) by mouth 2 (two) times daily for 5 days. Take with meals and a full glass of water., Disp: 10 tablet, Rfl: 0  EXAM:  VITALS per patient if applicable: P 93 BP [id number] 100s.  RR between 12-20 bpm  GENERAL: alert, oriented, appears well and in no acute distress  HEENT: atraumatic, conjunctiva clear, no obvious abnormalities on inspection of external nose and ears  NECK: normal movements of the head and neck  LUNGS: on inspection no signs of respiratory distress, breathing rate appears normal, no obvious gross SOB, gasping or wheezing  CV: no obvious cyanosis  MS: moves all visible extremities without noticeable abnormality  PSYCH/NEURO: pleasant and cooperative, no obvious depression or anxiety, speech and thought processing grossly intact  ASSESSMENT AND PLAN:  Discussed the following assessment and plan:  Essential hypertension  -uncontrolled -will d/c benazepril. -continue norvasc 10 mg -will start metoprolol 25 mg BID. -will obtain labs and recheck bp in office today. -if bp continues to remain elevated consider renal u/s - Plan: metoprolol tartrate (LOPRESSOR) 25 MG tablet, TSH, T4, free, Comprehensive metabolic panel  Hypokalemia -K+ 3.1 on 02/23/19 -pt completed short course of Kdur -recheck K+  F/u in 1-2 wks   I discussed the assessment and treatment plan with the patient. The patient was provided an opportunity to ask questions and all were answered. The patient agreed with the plan and demonstrated an understanding of the instructions.   The patient was advised to call back or seek an in-person evaluation if the symptoms worsen or if the condition fails to improve as  anticipated.   Billie Ruddy, MD

## 2019-03-12 NOTE — Addendum Note (Signed)
Addended by: Elmer Picker on: 03/12/2019 03:36 PM   Modules accepted: Orders

## 2019-03-13 LAB — COMPREHENSIVE METABOLIC PANEL
AG Ratio: 1.6 (calc) (ref 1.0–2.5)
ALT: 11 U/L (ref 6–29)
AST: 14 U/L (ref 10–30)
Albumin: 4.4 g/dL (ref 3.6–5.1)
Alkaline phosphatase (APISO): 93 U/L (ref 31–125)
BUN/Creatinine Ratio: 6 (calc) (ref 6–22)
BUN: 5 mg/dL — ABNORMAL LOW (ref 7–25)
CO2: 27 mmol/L (ref 20–32)
Calcium: 9.4 mg/dL (ref 8.6–10.2)
Chloride: 102 mmol/L (ref 98–110)
Creat: 0.82 mg/dL (ref 0.50–1.10)
Globulin: 2.8 g/dL (calc) (ref 1.9–3.7)
Glucose, Bld: 79 mg/dL (ref 65–99)
Potassium: 3.5 mmol/L (ref 3.5–5.3)
Sodium: 140 mmol/L (ref 135–146)
Total Bilirubin: 0.4 mg/dL (ref 0.2–1.2)
Total Protein: 7.2 g/dL (ref 6.1–8.1)

## 2019-03-13 LAB — T4, FREE: Free T4: 1.2 ng/dL (ref 0.8–1.8)

## 2019-03-13 LAB — TSH: TSH: 1.52 mIU/L

## 2019-03-15 ENCOUNTER — Other Ambulatory Visit: Payer: Self-pay | Admitting: Family Medicine

## 2019-03-15 MED ORDER — POTASSIUM CHLORIDE CRYS ER 20 MEQ PO TBCR: 20.0000 meq | EXTENDED_RELEASE_TABLET | Freq: Every day | ORAL | 0 refills | Status: DC

## 2019-03-17 ENCOUNTER — Ambulatory Visit: Payer: Self-pay

## 2019-03-17 ENCOUNTER — Ambulatory Visit (INDEPENDENT_AMBULATORY_CARE_PROVIDER_SITE_OTHER): Payer: BC Managed Care – PPO | Admitting: Orthopaedic Surgery

## 2019-03-17 ENCOUNTER — Encounter: Payer: Self-pay | Admitting: Orthopaedic Surgery

## 2019-03-17 DIAGNOSIS — Z96643 Presence of artificial hip joint, bilateral: Secondary | ICD-10-CM | POA: Diagnosis not present

## 2019-03-17 NOTE — Progress Notes (Signed)
The patient is a very pleasant 43 year old female well-known to me.  She is 8 months out from a left total hip arthroplasty and over a year out from a right total hip arthroplasty.  Both of these showed debilitating signs of arthritis prior to surgery.  Her balance of pronation was significantly off prior to surgery as well as her leg length and hip balancing.  She is doing well overall.  She left hip is doing great but the right hip still hurts her little bit.  She has been dealing with some blood pressure issues as well and her primary care physician is working with this.  She does walk without an assistive device.  There is a slight limp to her gait but is only slight.  I can put both hips to internal and external rotation without any significant difficulties.  An AP pelvis lateral both hips show well-seated implants with no complicating features or evidence of loosening.  At this point should continue creaser activities as comfort allows.  I do feel her body suggesting these replacements seems to be doing well.  I would like to see her back in 6 months.  At that visit I like a final low AP pelvis only.

## 2019-03-30 ENCOUNTER — Other Ambulatory Visit: Payer: Self-pay | Admitting: Family Medicine

## 2019-03-30 DIAGNOSIS — D509 Iron deficiency anemia, unspecified: Secondary | ICD-10-CM

## 2019-03-31 MED ORDER — IRON 325 (65 FE) MG PO TABS: 1.0000 | ORAL_TABLET | Freq: Every day | ORAL | 0 refills | Status: DC

## 2019-04-07 ENCOUNTER — Ambulatory Visit (INDEPENDENT_AMBULATORY_CARE_PROVIDER_SITE_OTHER): Payer: BC Managed Care – PPO | Admitting: Family Medicine

## 2019-04-07 ENCOUNTER — Encounter: Payer: Self-pay | Admitting: Family Medicine

## 2019-04-07 ENCOUNTER — Other Ambulatory Visit: Payer: Self-pay

## 2019-04-07 VITALS — BP 128/80 | HR 62 | Temp 98.1°F | Wt 253.0 lb

## 2019-04-07 DIAGNOSIS — I10 Essential (primary) hypertension: Secondary | ICD-10-CM

## 2019-04-07 DIAGNOSIS — Z23 Encounter for immunization: Secondary | ICD-10-CM | POA: Diagnosis not present

## 2019-04-07 MED ORDER — METOPROLOL SUCCINATE ER 50 MG PO TB24: 50.0000 mg | ORAL_TABLET | Freq: Every day | ORAL | 3 refills | Status: DC

## 2019-04-07 MED ORDER — AMLODIPINE BESYLATE 10 MG PO TABS: 10.0000 mg | ORAL_TABLET | Freq: Every day | ORAL | 3 refills | Status: DC

## 2019-04-07 NOTE — Patient Instructions (Signed)
Influenza (Flu) Vaccine (Inactivated or Recombinant): What You Need to Know 1. Why get vaccinated? Influenza vaccine can prevent influenza (flu). Flu is a contagious disease that spreads around the [place] every year, usually between October and May. Anyone can get the flu, but it is more dangerous for some people. Infants and young children, people 44 years of age and older, pregnant women, and people with certain health conditions or a weakened immune system are at greatest risk of flu complications. Pneumonia, bronchitis, sinus infections and ear infections are examples of flu-related complications. If you have a medical condition, such as heart disease, cancer or diabetes, flu can make it worse. Flu can cause fever and chills, sore throat, muscle aches, fatigue, cough, headache, and runny or stuffy nose. Some people may have vomiting and diarrhea, though this is more common in children than adults. Each year thousands of people in the [place] die from flu, and many more are hospitalized. Flu vaccine prevents millions of illnesses and flu-related visits to the doctor each year. 2. Influenza vaccine CDC recommends everyone 57 months of age and older get vaccinated every flu season. Children 6 months through 2 years of age may need 2 doses during a single flu season. Everyone else needs only 1 dose each flu season. It takes about 2 weeks for protection to develop after vaccination. There are many flu viruses, and they are always changing. Each year a new flu vaccine is made to protect against three or four viruses that are likely to cause disease in the upcoming flu season. Even when the vaccine doesn't exactly match these viruses, it may still provide some protection. Influenza vaccine does not cause flu. Influenza vaccine may be given at the same time as other vaccines. 3. Talk with your health care provider Tell your vaccine provider if the person getting the vaccine:  Has had an  allergic reaction after a previous dose of influenza vaccine, or has any severe, life-threatening allergies.  Has ever had Guillain-Barr Syndrome (also called GBS). In some cases, your health care provider may decide to postpone influenza vaccination to a future visit. People with minor illnesses, such as a cold, may be vaccinated. People who are moderately or severely ill should usually wait until they recover before getting influenza vaccine. Your health care provider can give you more information. 4. Risks of a vaccine reaction  Soreness, redness, and swelling where shot is given, fever, muscle aches, and headache can happen after influenza vaccine.  There may be a very small increased risk of Guillain-Barr Syndrome (GBS) after inactivated influenza vaccine (the flu shot). Young children who get the flu shot along with pneumococcal vaccine (PCV13), and/or DTaP vaccine at the same time might be slightly more likely to have a seizure caused by fever. Tell your health care provider if a child who is getting flu vaccine has ever had a seizure. People sometimes faint after medical procedures, including vaccination. Tell your provider if you feel dizzy or have vision changes or ringing in the ears. As with any medicine, there is a very remote chance of a vaccine causing a severe allergic reaction, other serious injury, or death. 5. What if there is a serious problem? An allergic reaction could occur after the vaccinated person leaves the clinic. If you see signs of a severe allergic reaction (hives, swelling of the face and throat, difficulty breathing, a fast heartbeat, dizziness, or weakness), call 9-1-1 and get the person to the nearest hospital. For other signs that  concern you, call your health care provider. Adverse reactions should be reported to the Vaccine Adverse Event Reporting System (VAERS). Your health care provider will usually file this report, or you can do it yourself. Visit the  VAERS website at www.vaers.SamedayNews.es or call 775 228 2903.VAERS is only for reporting reactions, and VAERS staff do not give medical advice. 6. The National Vaccine Injury Compensation Program The [employer] Vaccine Injury Compensation Program (VICP) is a federal program that was created to compensate people who may have been injured by certain vaccines. Visit the VICP website at GoldCloset.com.ee or call (517)031-4474 to learn about the program and about filing a claim. There is a time limit to file a claim for compensation. 7. How can I learn more?  Ask your healthcare provider.  Call your local or state health department.  Contact the Centers for Disease Control and Prevention (CDC): ? Call 330 884 3398 (1-800-CDC-INFO) or ? Visit CDC's https://gibson.com/ Vaccine Information Statement (Interim) Inactivated Influenza Vaccine (02/26/2018) This information is not intended to replace advice given to you by your health care provider. Make sure you discuss any questions you have with your health care provider. Document Released: 04/25/2006 Document Revised: 10/20/2018 Document Reviewed: 03/02/2018 Elsevier Patient Education  2020 [employer].  Hypertension, Adult Hypertension is another name for high blood pressure. High blood pressure forces your heart to work harder to pump blood. This can cause problems over time. There are two numbers in a blood pressure reading. There is a top number (systolic) over a bottom number (diastolic). It is best to have a blood pressure that is below 120/80. Healthy choices can help lower your blood pressure, or you may need medicine to help lower it. What are the causes? The cause of this condition is not known. Some conditions may be related to high blood pressure. What increases the risk?  Smoking.  Having type 2 diabetes mellitus, high cholesterol, or both.  Not getting enough exercise or physical activity.  Being overweight.  Having too  much fat, sugar, calories, or salt (sodium) in your diet.  Drinking too much alcohol.  Having long-term (chronic) kidney disease.  Having a family history of high blood pressure.  Age. Risk increases with age.  Race. You may be at higher risk if you are African American.  Gender. Men are at higher risk than women before age 63. After age 52, women are at higher risk than men.  Having obstructive sleep apnea.  Stress. What are the signs or symptoms?  High blood pressure may not cause symptoms. Very high blood pressure (hypertensive crisis) may cause: ? Headache. ? Feelings of worry or nervousness (anxiety). ? Shortness of breath. ? Nosebleed. ? A feeling of being sick to your stomach (nausea). ? Throwing up (vomiting). ? Changes in how you see. ? Very bad chest pain. ? Seizures. How is this treated?  This condition is treated by making healthy lifestyle changes, such as: ? Eating healthy foods. ? Exercising more. ? Drinking less alcohol.  Your health care provider may prescribe medicine if lifestyle changes are not enough to get your blood pressure under control, and if: ? Your top number is above 130. ? Your bottom number is above 80.  Your personal target blood pressure may vary. Follow these instructions at home: Eating and drinking   If told, follow the DASH eating plan. To follow this plan: ? Fill one half of your plate at each meal with fruits and vegetables. ? Fill one fourth of your plate at each meal  with whole grains. Whole grains include whole-wheat pasta, brown rice, and whole-grain bread. ? Eat or drink low-fat dairy products, such as skim milk or low-fat yogurt. ? Fill one fourth of your plate at each meal with low-fat (lean) proteins. Low-fat proteins include fish, chicken without skin, eggs, beans, and tofu. ? Avoid fatty meat, cured and processed meat, or chicken with skin. ? Avoid pre-made or processed food.  Eat less than 1,500 mg of salt each  day.  Do not drink alcohol if: ? Your doctor tells you not to drink. ? You are pregnant, may be pregnant, or are planning to become pregnant.  If you drink alcohol: ? Limit how much you use to:  0-1 drink a day for women.  0-2 drinks a day for men. ? Be aware of how much alcohol is in your drink. In the [place] one drink equals one 12 oz bottle of beer (355 mL), one 5 oz glass of wine (148 mL), or one 1 oz glass of hard liquor (44 mL). Lifestyle   Work with your doctor to stay at a healthy weight or to lose weight. Ask your doctor what the best weight is for you.  Get at least 30 minutes of exercise most days of the week. This may include walking, swimming, or biking.  Get at least 30 minutes of exercise that strengthens your muscles (resistance exercise) at least 3 days a week. This may include lifting weights or doing Pilates.  Do not use any products that contain nicotine or tobacco, such as cigarettes, e-cigarettes, and chewing tobacco. If you need help quitting, ask your doctor.  Check your blood pressure at home as told by your doctor.  Keep all follow-up visits as told by your doctor. This is important. Medicines  Take over-the-counter and prescription medicines only as told by your doctor. Follow directions carefully.  Do not skip doses of blood pressure medicine. The medicine does not work as well if you skip doses. Skipping doses also puts you at risk for problems.  Ask your doctor about side effects or reactions to medicines that you should watch for. Contact a doctor if you:  Think you are having a reaction to the medicine you are taking.  Have headaches that keep coming back (recurring).  Feel dizzy.  Have swelling in your ankles.  Have trouble with your vision. Get help right away if you:  Get a very bad headache.  Start to feel mixed up (confused).  Feel weak or numb.  Feel faint.  Have very bad pain in your: ? Chest. ? Belly (abdomen).  Throw  up more than once.  Have trouble breathing. Summary  Hypertension is another name for high blood pressure.  High blood pressure forces your heart to work harder to pump blood.  For most people, a normal blood pressure is less than 120/80.  Making healthy choices can help lower blood pressure. If your blood pressure does not get lower with healthy choices, you may need to take medicine. This information is not intended to replace advice given to you by your health care provider. Make sure you discuss any questions you have with your health care provider. Document Released: 12/18/2007 Document Revised: 03/11/2018 Document Reviewed: 03/11/2018 Elsevier Patient Education  2020 [employer].

## 2019-04-07 NOTE — Progress Notes (Signed)
Subjective:    Patient ID: [name], female    DOB: May 11, 1976, 43 y.o.   MRN: [id number]  No chief complaint on file.   HPI Patient was seen today for f/u on BP.  States notes improvement in bp since starting Metoprolol 25 mg BID.  Also taking norvasc 10 mg daily.  Pt states when she started the metoprolol she had L upper CP for 3 days, that resolved.  Pt denies dizziness, HAs, changes in vision.  Pt hoping to restart PT now that bp has improved.  Past Medical History:  Diagnosis Date  . Anemia   . Arthritis   . Back pain    lower back   . Complication of anesthesia    age 58 had twilight anesthesia and woke up during surgery   . Depression   . Fatty liver 01/22/2016   Noted on MRI abd  . Fibroids   . Heavy periods   . History of gallstones   . History of thyroid nodule    Left  . Hypertension   . Irregular periods   . Obesity   . Pancreatic lesion   . Pinched nerve    in back  . Ventral hernia 12/26/2014   Midline noted on CT Abd pelvis    No Known Allergies  ROS General: Denies fever, chills, night sweats, changes in weight, changes in appetite HEENT: Denies headaches, ear pain, changes in vision, rhinorrhea, sore throat CV: Denies CP, palpitations, SOB, orthopnea Pulm: Denies SOB, cough, wheezing GI: Denies abdominal pain, nausea, vomiting, diarrhea, constipation GU: Denies dysuria, hematuria, frequency, vaginal discharge Msk: Denies muscle cramps, joint pains Neuro: Denies weakness, numbness, tingling Skin: Denies rashes, bruising Psych: Denies depression, anxiety, hallucinations      Objective:    Blood pressure 128/80, pulse 62, temperature 98.1 F (36.7 C), temperature source Oral, weight 253 lb (114.8 kg), SpO2 98 %.   Gen. Pleasant, well-nourished, in no distress, normal affect   HEENT: [place]/AT, face symmetric,  no scleral icterus, PERRLA, nares patent without drainage Lungs: no accessory muscle use, CTAB, no wheezes or  rales Cardiovascular: RRR, no m/r/g, no peripheral edema Musculoskeletal: No deformities, no cyanosis or clubbing, normal tone Neuro:  A&Ox3, CN II-XII intact, normal gait Skin:  Warm, no lesions/ rash   Wt Readings from Last 3 Encounters:  04/07/19 253 lb (114.8 kg)  02/23/19 254 lb (115.2 kg)  02/11/19 253 lb (114.8 kg)    Lab Results  Component Value Date   WBC 5.5 02/23/2019   HGB 15.4 (H) 02/23/2019   HCT 46.4 (H) 02/23/2019   PLT 238 02/23/2019   GLUCOSE 79 03/12/2019   CHOL 175 01/14/2018   TRIG 164.0 (H) 01/14/2018   HDL 47.50 01/14/2018   LDLCALC 95 01/14/2018   ALT 11 03/12/2019   AST 14 03/12/2019   NA 140 03/12/2019   K 3.5 03/12/2019   CL 102 03/12/2019   CREATININE 0.82 03/12/2019   BUN 5 (L) 03/12/2019   CO2 27 03/12/2019   TSH 1.52 03/12/2019   INR 1.00 02/22/2015   HGBA1C 4.8 01/14/2018    Assessment/Plan:  Essential hypertension  -improved -will d/c metoprolol tartrate 25 mg BID and start Metoprolol succinate 50 mg daily. -pt to continue norvasc 10 mg daily -continue lifestyle modifications and checking bp daily. - Plan: metoprolol succinate (TOPROL-XL) 50 MG 24 hr tablet, amLODipine (NORVASC) 10 MG tablet -note written for pt to return to PT.  Need for influenza vaccination  - Plan: Flu Vaccine  QUAD 6+ mos PF IM (Fluarix Quad PF)  F/u prn  Grier Mitts, MD

## 2019-04-09 ENCOUNTER — Encounter: Payer: Self-pay | Admitting: Family Medicine

## 2019-06-19 ENCOUNTER — Other Ambulatory Visit: Payer: Self-pay

## 2019-06-19 DIAGNOSIS — Z20822 Contact with and (suspected) exposure to covid-19: Secondary | ICD-10-CM

## 2019-06-22 LAB — NOVEL CORONAVIRUS, NAA: SARS-CoV-2, NAA: NOT DETECTED

## 2019-07-12 ENCOUNTER — Other Ambulatory Visit: Payer: Self-pay | Admitting: Family Medicine

## 2019-07-12 DIAGNOSIS — D509 Iron deficiency anemia, unspecified: Secondary | ICD-10-CM

## 2019-07-12 MED ORDER — IRON 325 (65 FE) MG PO TABS: 1.0000 | ORAL_TABLET | Freq: Every day | ORAL | 0 refills | Status: DC

## 2019-07-12 NOTE — Telephone Encounter (Signed)
Message routed to PCP CMA  

## 2019-08-02 ENCOUNTER — Other Ambulatory Visit: Payer: Self-pay | Admitting: Family Medicine

## 2019-08-02 ENCOUNTER — Encounter: Payer: Self-pay | Admitting: Family Medicine

## 2019-08-02 DIAGNOSIS — G8929 Other chronic pain: Secondary | ICD-10-CM

## 2019-08-09 ENCOUNTER — Encounter: Payer: Self-pay | Admitting: Family Medicine

## 2019-08-10 ENCOUNTER — Ambulatory Visit: Payer: BC Managed Care – PPO | Attending: Internal Medicine

## 2019-08-10 DIAGNOSIS — Z20822 Contact with and (suspected) exposure to covid-19: Secondary | ICD-10-CM

## 2019-08-11 LAB — NOVEL CORONAVIRUS, NAA: SARS-CoV-2, NAA: NOT DETECTED

## 2019-08-12 ENCOUNTER — Encounter: Payer: Self-pay | Admitting: Family Medicine

## 2019-08-13 NOTE — Telephone Encounter (Signed)
Pt has been scheduled for a virtual visit with Dr Volanda Napoleon on 08/14/2019

## 2019-08-14 ENCOUNTER — Telehealth (INDEPENDENT_AMBULATORY_CARE_PROVIDER_SITE_OTHER): Payer: BC Managed Care – PPO | Admitting: Family Medicine

## 2019-08-14 DIAGNOSIS — M65311 Trigger thumb, right thumb: Secondary | ICD-10-CM

## 2019-08-14 DIAGNOSIS — M79641 Pain in right hand: Secondary | ICD-10-CM | POA: Diagnosis not present

## 2019-08-14 NOTE — Patient Instructions (Signed)
Trigger Finger  Trigger finger, also called stenosing tenosynovitis,  is a condition that causes a finger to get stuck in a bent position. Each finger has a tendon, which is a tough, cord-like tissue that connects muscle to bone, and each tendon passes through a tunnel of tissue called a tendon sheath. To move your finger, your tendon needs to glide freely through the sheath. Trigger finger happens when the tendon or the sheath thickens, making it difficult to move your finger. Trigger finger can affect any finger or a thumb. It may affect more than one finger. Mild cases may clear up with rest and medicine. Severe cases require more treatment. What are the causes? Trigger finger is caused by a thickened finger tendon or tendon sheath. The cause of this thickening is not known. What increases the risk? The following factors may make you more likely to develop this condition:  Doing activities that require a strong grip.  Having rheumatoid arthritis, gout, or diabetes.  Being 90-71 years old.  Being female. What are the signs or symptoms? Symptoms of this condition include:  Pain when bending or straightening your finger.  Tenderness or swelling where your finger attaches to the palm of your hand.  A lump in the palm of your hand or on the inside of your finger.  Hearing a noise like a pop or a snap when you try to straighten your finger.  Feeling a catching or locking sensation when you try to straighten your finger.  Being unable to straighten your finger. How is this diagnosed? This condition is diagnosed based on your symptoms and a physical exam. How is this treated? This condition may be treated by:  Resting your finger and avoiding activities that make symptoms worse.  Wearing a finger splint to keep your finger extended.  Taking NSAIDs, such as ibuprofen, to relieve pain and swelling.  Doing gentle exercises to stretch the finger as told by your health care  provider.  Having medicine that reduces swelling and inflammation (steroids) injected into the tendon sheath. Injections may need to be repeated.  Having surgery to open the tendon sheath. This may be done if other treatments do not work and you cannot straighten your finger. You may need physical therapy after surgery. Follow these instructions at home: If you have a splint:  Wear the splint as told by your health care provider. Remove it only as told by your health care provider.  Loosen it if your fingers tingle, become numb, or turn cold and blue.  Keep it clean.  If the splint is not waterproof: ? Do not let it get wet. ? Cover it with a watertight covering when you take a bath or shower. Managing pain, stiffness, and swelling     If directed, apply heat to the affected area as often as told by your health care provider. Use the heat source that your health care provider recommends, such as a moist heat pack or a heating pad.  Place a towel between your skin and the heat source.  Leave the heat on for 20-30 minutes.  Remove the heat if your skin turns bright red. This is especially important if you are unable to feel pain, heat, or cold. You may have a greater risk of getting burned. If directed, put ice on the painful area. To do this:  If you have a removable splint, remove it as told by your health care provider.  Put ice in a plastic bag.  Place a  towel between your skin and the bag or between your splint and the bag.  Leave the ice on for 20 minutes, 2-3 times a day.  Activity  Rest your finger as told by your health care provider. Avoid activities that make the pain worse.  Return to your normal activities as told by your health care provider. Ask your health care provider what activities are safe for you.  Do exercises as told by your health care provider.  Ask your health care provider when it is safe to drive if you have a splint on your hand. General  instructions  Take over-the-counter and prescription medicines only as told by your health care provider.  Keep all follow-up visits as told by your health care provider. This is important. Contact a health care provider if:  Your symptoms are not improving with home care. Summary  Trigger finger, also called stenosing tenosynovitis, causes your finger to get stuck in a bent position. This can make it difficult and painful to straighten your finger.  This condition develops when a finger tendon or tendon sheath thickens.  Treatment may include resting your finger, wearing a splint, and taking medicines.  In severe cases, surgery to open the tendon sheath may be needed. This information is not intended to replace advice given to you by your health care provider. Make sure you discuss any questions you have with your health care provider. Document Revised: 11/16/2018 Document Reviewed: 11/16/2018 Elsevier Patient Education  Govan is a term that is commonly used to refer to joint pain or joint disease. There are more than 100 types of arthritis. What are the causes? The most common cause of this condition is wear and tear of a joint. Other causes include:  Gout.  Inflammation of a joint.  An infection of a joint.  Sprains and other injuries near the joint.  A reaction to medicines or drugs, or an allergic reaction. In some cases, the cause may not be known. What are the signs or symptoms? The main symptom of this condition is pain in the joint during movement. Other symptoms include:  Redness, swelling, or stiffness at a joint.  Warmth coming from the joint.  Fever.  Overall feeling of illness. How is this diagnosed? This condition may be diagnosed with a physical exam and tests, including:  Blood tests.  Urine tests.  Imaging tests, such as X-rays, an MRI, or a CT scan. Sometimes, fluid is removed from a joint for testing. How is  this treated? This condition may be treated with:  Treatment of the cause, if it is known.  Rest.  Raising (elevating) the joint.  Applying cold or hot packs to the joint.  Medicines to improve symptoms and reduce inflammation.  Injections of a steroid such as cortisone into the joint to help reduce pain and inflammation. Depending on the cause of your arthritis, you may need to make lifestyle changes to reduce stress on your joint. Changes may include:  Exercising more.  Losing weight. Follow these instructions at home: Medicines  Take over-the-counter and prescription medicines only as told by your health care provider.  Do not take aspirin to relieve pain if your health care provider thinks that gout may be causing your pain. Activity  Rest your joint if told by your health care provider. Rest is important when your disease is active and your joint feels painful, swollen, or stiff.  Avoid activities that make the pain worse. It is important  to balance activity with rest.  Exercise your joint regularly with range-of-motion exercises as told by your health care provider. Try doing low-impact exercise, such as: ? Swimming. ? Water aerobics. ? Biking. ? Walking. Managing pain, stiffness, and swelling      If directed, put ice on the joint. ? Put ice in a plastic bag. ? Place a towel between your skin and the bag. ? Leave the ice on for 20 minutes, 2-3 times per day.  If your joint is swollen, raise (elevate) it above the level of your heart if directed by your health care provider.  If your joint feels stiff in the morning, try taking a warm shower.  If directed, apply heat to the affected area as often as told by your health care provider. Use the heat source that your health care provider recommends, such as a moist heat pack or a heating pad. If you have diabetes, do not apply heat without permission from your health care provider. To apply heat: ? Place a towel  between your skin and the heat source. ? Leave the heat on for 20-30 minutes. ? Remove the heat if your skin turns bright red. This is especially important if you are unable to feel pain, heat, or cold. You may have a greater risk of getting burned. General instructions  Do not use any products that contain nicotine or tobacco, such as cigarettes, e-cigarettes, and chewing tobacco. If you need help quitting, ask your health care provider.  Keep all follow-up visits as told by your health care provider. This is important. Contact a health care provider if:  The pain gets worse.  You have a fever. Get help right away if:  You develop severe joint pain, swelling, or redness.  Many joints become painful and swollen.  You develop severe back pain.  You develop severe weakness in your leg.  You cannot control your bladder or bowels. Summary  Arthritis is a term that is commonly used to refer to joint pain or joint disease. There are more than 100 types of arthritis.  The most common cause of this condition is wear and tear of a joint. Other causes include gout, inflammation or infection of the joint, sprains, or allergies.  Symptoms of this condition include redness, swelling, or stiffness of the joint. Other symptoms include warmth, fever, or feeling ill.  This condition is treated with rest, elevation, medicines, and applying cold or hot packs.  Follow your health care provider's instructions about medicines, activity, exercises, and other home care treatments. This information is not intended to replace advice given to you by your health care provider. Make sure you discuss any questions you have with your health care provider. Document Revised: 06/08/2018 Document Reviewed: 06/08/2018 Elsevier Patient Education  2020 [employer].

## 2019-08-14 NOTE — Progress Notes (Signed)
Virtual Visit via Video Note  I connected with Rachel Warren on 08/14/19 at 12:00 PM EST by a video enabled telemedicine application 2/2 [id number] pandemic and verified that I am speaking with the correct person using two identifiers.  Location patient: home Location provider:work or home office Persons participating in the virtual visit: patient, provider  I discussed the limitations of evaluation and management by telemedicine and the availability of in person appointments. The patient expressed understanding and agreed to proceed.   HPI: Pt is a 44 yo pleasant female with pmh sig for h/o R hip OA s/p THR.  Pt seen for R hand pain.  Thought she slept on it wrong a few months ago.  Did exercises given to her by physical therapist.  Notes some difficulty with writing and turning the key to get in the door.  At times R thumb gets "stuck".  R thumb with edema.  Pt notes increase pain in joints of R hand with cold weather or if it rains.  Pt tried Aleeve for symptoms.  Pt is R handed.    Pt still having some pain in R leg s/p THR for OA.  In PT.  ROS: See pertinent positives and negatives per HPI.  Past Medical History:  Diagnosis Date  . Anemia   . Arthritis   . Back pain    lower back   . Complication of anesthesia    age 32 had twilight anesthesia and woke up during surgery   . Depression   . Fatty liver 01/22/2016   Noted on MRI abd  . Fibroids   . Heavy periods   . History of gallstones   . History of thyroid nodule    Left  . Hypertension   . Irregular periods   . Obesity   . Pancreatic lesion   . Pinched nerve    in back  . Ventral hernia 12/26/2014   Midline noted on CT Abd pelvis    Past Surgical History:  Procedure Laterality Date  . benign breast cyst removed Left    age 44  . CHOLECYSTECTOMY    . INSERTION OF MESH N/A 02/27/2015   Procedure: INSERTION OF MESH;  Surgeon: Jackolyn Confer, MD;  Location: [facility];  Service: General;  Laterality: N/A;  . IR  ANGIOGRAM PELVIS SELECTIVE OR SUPRASELECTIVE  12/22/2018  . IR ANGIOGRAM PELVIS SELECTIVE OR SUPRASELECTIVE  12/22/2018  . IR ANGIOGRAM SELECTIVE EACH ADDITIONAL VESSEL  12/22/2018  . IR ANGIOGRAM SELECTIVE EACH ADDITIONAL VESSEL  12/22/2018  . IR EMBO TUMOR ORGAN ISCHEMIA INFARCT INC GUIDE ROADMAPPING  12/22/2018  . IR RADIOLOGIST EVAL & MGMT  09/17/2018  . IR RADIOLOGIST EVAL & MGMT  01/05/2019  . IR US GUIDE VASC ACCESS RIGHT  12/22/2018  . LAPAROSCOPIC ASSISTED VENTRAL HERNIA REPAIR N/A 02/27/2015   Procedure: LAPAROSCOPIC VENTRAL HERNIA REPAIR WITH MESH;  Surgeon: Jackolyn Confer, MD;  Location: [facility];  Service: General;  Laterality: N/A;  . THYROIDECTOMY, PARTIAL    . TOTAL HIP ARTHROPLASTY Right 11/28/2017   Procedure: RIGHT TOTAL HIP ARTHROPLASTY ANTERIOR APPROACH;  Surgeon: Mcarthur Rossetti, MD;  Location: [facility];  Service: Orthopedics;  Laterality: Right;  . TOTAL HIP ARTHROPLASTY Left 07/31/2018   Procedure: LEFT TOTAL HIP ARTHROPLASTY ANTERIOR APPROACH;  Surgeon: Mcarthur Rossetti, MD;  Location: [facility];  Service: Orthopedics;  Laterality: Left;  Failed Spinal    Family History  Problem Relation Age of Onset  . Hypertension Father   . Arthritis Father   .  Clotting disorder Father   . Prostate cancer Paternal Grandfather     SOCIAL HX: Pt is a Pharmacist, hospital.  Pt's school is doing [employer] investment [employer].   Current Outpatient Medications:  .  amLODipine (NORVASC) 10 MG tablet, Take 1 tablet (10 mg total) by mouth daily., Disp: 90 tablet, Rfl: 3 .  cyclobenzaprine (FLEXERIL) 10 MG tablet, Take 1 tablet (10 mg total) by mouth at bedtime., Disp: 30 tablet, Rfl: 1 .  diclofenac sodium (VOLTAREN) 1 % GEL, Apply 2 g topically 4 (four) times daily. (Patient taking differently: Apply 2 g topically 4 (four) times daily as needed (pain). ), Disp: 100 g, Rfl: 3 .  DULoxetine (CYMBALTA) 30 MG capsule, Take 1 capsule by mouth twice daily, Disp: 180 capsule, Rfl: 0 .  Ferrous Sulfate (IRON) 325 (65 Fe)  MG TABS, Take 1 tablet (325 mg total) by mouth daily., Disp: 90 tablet, Rfl: 0 .  metoprolol succinate (TOPROL-XL) 50 MG 24 hr tablet, Take 1 tablet (50 mg total) by mouth daily. Take with or immediately following a meal., Disp: 90 tablet, Rfl: 3 .  naproxen sodium (ALEVE) 220 MG tablet, Take 220 mg by mouth as needed (pain/headache). , Disp: , Rfl:  .  potassium chloride SA (K-DUR) 20 MEQ tablet, Take 1 tablet (20 mEq total) by mouth daily for 5 days. Take with meals and a full glass of water., Disp: 5 tablet, Rfl: 0  EXAM:  VITALS per patient if applicable:  RR between 12-20 bpm  GENERAL: alert, oriented, appears well and in no acute distress  HEENT: atraumatic, conjunctiva clear, no obvious abnormalities on inspection of external nose and ears  NECK: normal movements of the head and neck  LUNGS: on inspection no signs of respiratory distress, breathing rate appears normal, no obvious gross SOB, gasping or wheezing  CV: no obvious cyanosis  MS: moves all visible extremities without noticeable abnormality  PSYCH/NEURO: pleasant and cooperative, no obvious depression or anxiety, speech and thought processing grossly intact  ASSESSMENT AND PLAN:  Discussed the following assessment and plan:  Pain of right hand  -discussed possible causes including arthritis -will obtain imaging to evaluate for signs of RA -discussed supportive care: NSAIDs, heat, topical OTC analgesics -pt would like to avoid prednisone if possible. - Plan: DG Hand Complete Right  Trigger finger of right thumb -discussed treatment options. -given info -consider f/u with ortho.  F/u prn   I discussed the assessment and treatment plan with the patient. The patient was provided an opportunity to ask questions and all were answered. The patient agreed with the plan and demonstrated an understanding of the instructions.   The patient was advised to call back or seek an in-person evaluation if the symptoms worsen  or if the condition fails to improve as anticipated.   Billie Ruddy, MD

## 2019-08-17 ENCOUNTER — Ambulatory Visit (INDEPENDENT_AMBULATORY_CARE_PROVIDER_SITE_OTHER)
Admission: RE | Admit: 2019-08-17 | Discharge: 2019-08-17 | Disposition: A | Discharge status: Home / Self Care | Payer: BC Managed Care – PPO | Source: Ambulatory Visit | Attending: Family Medicine | Admitting: Family Medicine

## 2019-08-17 ENCOUNTER — Other Ambulatory Visit: Payer: Self-pay

## 2019-08-17 DIAGNOSIS — M79641 Pain in right hand: Secondary | ICD-10-CM | POA: Diagnosis not present

## 2019-08-27 ENCOUNTER — Other Ambulatory Visit: Payer: Self-pay | Admitting: Interventional Radiology

## 2019-08-30 ENCOUNTER — Other Ambulatory Visit: Payer: Self-pay | Admitting: Interventional Radiology

## 2019-08-30 DIAGNOSIS — D250 Submucous leiomyoma of uterus: Secondary | ICD-10-CM

## 2019-09-07 ENCOUNTER — Other Ambulatory Visit: Payer: Self-pay

## 2019-09-07 ENCOUNTER — Ambulatory Visit (HOSPITAL_COMMUNITY)
Admission: RE | Admit: 2019-09-07 | Discharge: 2019-09-07 | Disposition: A | Discharge status: Home / Self Care | Payer: BC Managed Care – PPO | Source: Ambulatory Visit | Attending: Interventional Radiology | Admitting: Interventional Radiology

## 2019-09-07 DIAGNOSIS — D250 Submucous leiomyoma of uterus: Secondary | ICD-10-CM | POA: Insufficient documentation

## 2019-09-07 LAB — POCT I-STAT CREATININE: Creatinine, Ser: 0.8 mg/dL (ref 0.44–1.00)

## 2019-09-07 MED ORDER — GADOBUTROL 1 MMOL/ML IV SOLN
10.0000 mL | Freq: Once | INTRAVENOUS | Status: AC | PRN
  Administered 2019-09-07: 10 mL via INTRAVENOUS

## 2019-09-08 ENCOUNTER — Telehealth: Payer: BC Managed Care – PPO

## 2019-09-11 ENCOUNTER — Encounter: Payer: Self-pay | Admitting: Family Medicine

## 2019-09-15 ENCOUNTER — Ambulatory Visit: Payer: BC Managed Care – PPO | Admitting: Orthopaedic Surgery

## 2019-09-20 ENCOUNTER — Ambulatory Visit: Payer: BC Managed Care – PPO | Admitting: Orthopaedic Surgery

## 2019-09-29 ENCOUNTER — Ambulatory Visit (INDEPENDENT_AMBULATORY_CARE_PROVIDER_SITE_OTHER): Payer: BC Managed Care – PPO

## 2019-09-29 ENCOUNTER — Other Ambulatory Visit: Payer: Self-pay

## 2019-09-29 ENCOUNTER — Encounter: Payer: Self-pay | Admitting: Orthopaedic Surgery

## 2019-09-29 ENCOUNTER — Ambulatory Visit: Payer: BC Managed Care – PPO | Admitting: Orthopaedic Surgery

## 2019-09-29 DIAGNOSIS — Z96643 Presence of artificial hip joint, bilateral: Secondary | ICD-10-CM | POA: Diagnosis not present

## 2019-09-29 NOTE — Progress Notes (Signed)
Rachel Warren is a very pleasant 44 year old female that I have replaced both of her hips.  Her left hip was placed 14 months ago in her right hip a year before that.  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  She waited a year in between hip replacements due to job requirements and needs.  She now walks without assist device and says her hips are doing great except for trochanteric bursitis that she does with of her right hip.  She has been going to physical therapy with dry needling and trying Voltaren gel on this area.  She walks with a nice balanced gait.  Her leg lengths are equal.  She tolerates me putting both hips through full range of motion with no difficulty.  There is some pain to palpation of the trochanteric area on the right side.  A low AP pelvis which shows both hips shows well-seated implants with no complicating features.  At this point since she is doing so well follow-up can be as needed.  If there are any issues at all she knows not to hesitate to give [place] a call.  All question concerns were answered and addressed.

## 2019-10-06 ENCOUNTER — Other Ambulatory Visit: Payer: Self-pay

## 2019-10-06 ENCOUNTER — Encounter: Payer: Self-pay | Admitting: *Deleted

## 2019-10-06 ENCOUNTER — Ambulatory Visit
Admission: RE | Admit: 2019-10-06 | Discharge: 2019-10-06 | Disposition: A | Discharge status: Home / Self Care | Payer: BC Managed Care – PPO | Source: Ambulatory Visit | Attending: Interventional Radiology | Admitting: Interventional Radiology

## 2019-10-06 DIAGNOSIS — D250 Submucous leiomyoma of uterus: Secondary | ICD-10-CM

## 2019-10-06 HISTORY — PX: IR RADIOLOGIST EVAL & MGMT: IMG5224

## 2019-10-06 NOTE — Progress Notes (Signed)
Chief Complaint: Follow up after uterine fibroid embolization.  History of Present Illness: Rachel Warren is a 44 y.o. female status post fibroid embolization procedure on 12/22/2018 to treat symptomatic uterine fibroids with associated menorrhagia, dysmenorrhea and chronic anemia.  Since the procedure, Rachel Warren states that her menstrual cycles have definitely become shorter in length overall but she still does experience approximately 2 days of heavy bleeding with some passage of clots.  This is compared to approximately 5 to 6 days of heavy bleeding previously.  She has not had her hemoglobin checked since the procedure.  She has been dealing with hip pain and weakness bilaterally which is worse during her menstrual cycle and has started seeing a physical therapist.  She has had prior bilateral hip replacement surgery. 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  Past Medical History:  Diagnosis Date  . Anemia   . Arthritis   . Back pain    lower back   . Complication of anesthesia    age 82 had twilight anesthesia and woke up during surgery   . Depression   . Fatty liver 01/22/2016   Noted on MRI abd  . Fibroids   . Heavy periods   . History of gallstones   . History of thyroid nodule    Left  . Hypertension   . Irregular periods   . Obesity   . Pancreatic lesion   . Pinched nerve    in back  . Ventral hernia 12/26/2014   Midline noted on CT Abd pelvis    Past Surgical History:  Procedure Laterality Date  . benign breast cyst removed Left    age 79  . CHOLECYSTECTOMY    . INSERTION OF MESH N/A 02/27/2015   Procedure: INSERTION OF MESH;  Surgeon: Jackolyn Confer, MD;  Location: [facility];  Service: General;  Laterality: N/A;  . IR ANGIOGRAM PELVIS SELECTIVE OR SUPRASELECTIVE  12/22/2018  . IR ANGIOGRAM PELVIS SELECTIVE OR SUPRASELECTIVE  12/22/2018  . IR ANGIOGRAM SELECTIVE EACH ADDITIONAL VESSEL  12/22/2018  . IR ANGIOGRAM SELECTIVE  EACH ADDITIONAL VESSEL  12/22/2018  . IR EMBO TUMOR ORGAN ISCHEMIA INFARCT INC GUIDE ROADMAPPING  12/22/2018  . IR RADIOLOGIST EVAL & MGMT  09/17/2018  . IR RADIOLOGIST EVAL & MGMT  01/05/2019  . IR US GUIDE VASC ACCESS RIGHT  12/22/2018  . LAPAROSCOPIC ASSISTED VENTRAL HERNIA REPAIR N/A 02/27/2015   Procedure: LAPAROSCOPIC VENTRAL HERNIA REPAIR WITH MESH;  Surgeon: Jackolyn Confer, MD;  Location: [facility];  Service: General;  Laterality: N/A;  . THYROIDECTOMY, PARTIAL    . TOTAL HIP ARTHROPLASTY Right 11/28/2017   Procedure: RIGHT TOTAL HIP ARTHROPLASTY ANTERIOR APPROACH;  Surgeon: Mcarthur Rossetti, MD;  Location: [facility];  Service: Orthopedics;  Laterality: Right;  . TOTAL HIP ARTHROPLASTY Left 07/31/2018   Procedure: LEFT TOTAL HIP ARTHROPLASTY ANTERIOR APPROACH;  Surgeon: Mcarthur Rossetti, MD;  Location: [facility];  Service: Orthopedics;  Laterality: Left;  Failed Spinal    Allergies: Patient has no known allergies.  Medications: Prior to Admission medications   Medication Sig Start Date End Date Taking? Authorizing Provider  amLODipine (NORVASC) 10 MG tablet Take 1 tablet (10 mg total) by mouth daily. 04/07/19   Billie Ruddy, MD  cyclobenzaprine (FLEXERIL) 10 MG tablet Take 1 tablet (10 mg total) by mouth at bedtime. 10/20/18   Pete Pelt, PA-C  diclofenac sodium (VOLTAREN) 1 % GEL Apply 2 g topically 4 (four) times daily.  Patient taking differently: Apply 2 g topically 4 (four) times daily as needed (pain).  11/19/18   Mcarthur Rossetti, MD  DULoxetine (CYMBALTA) 30 MG capsule Take 1 capsule by mouth twice daily 08/03/19   Billie Ruddy, MD  Ferrous Sulfate (IRON) 325 (65 Fe) MG TABS Take 1 tablet (325 mg total) by mouth daily. 07/12/19   Billie Ruddy, MD  metoprolol succinate (TOPROL-XL) 50 MG 24 hr tablet Take 1 tablet (50 mg total) by mouth daily. Take with or immediately following a meal. 04/07/19   Billie Ruddy, MD  naproxen sodium (ALEVE) 220 MG tablet Take 220  mg by mouth as needed (pain/headache).     [provider]  potassium chloride SA (K-DUR) 20 MEQ tablet Take 1 tablet (20 mEq total) by mouth daily for 5 days. Take with meals and a full glass of water. 03/15/19 03/20/19  Billie Ruddy, MD     Family History  Problem Relation Age of Onset  . Hypertension Father   . Arthritis Father   . Clotting disorder Father   . Prostate cancer Paternal Grandfather     Social History   Socioeconomic History  . Marital status: Married    Spouse name: Not on file  . Number of children: Not on file  . Years of education: Not on file  . Highest education level: Not on file  Occupational History  . Not on file  Tobacco Use  . Smoking status: Former Smoker    Types: Cigarettes    Quit date: 06/14/2017    Years since quitting: 2.3  . Smokeless tobacco: Never Used  Substance and Sexual Activity  . Alcohol use: Yes    Alcohol/week: 2.0 - 3.0 standard drinks    Types: 2 - 3 Glasses of wine per week  . Drug use: Yes    Types: Marijuana    Comment: last time 07/08/2018  . Sexual activity: Yes    Birth control/protection: Condom  Other Topics Concern  . Not on file  Social History Narrative  . Not on file   Social Determinants of Health   Financial Resource Strain:   . Difficulty of Paying Living Expenses:   Food Insecurity:   . Worried About Charity fundraiser in the Last Year:   . Arboriculturist in the Last Year:   Transportation Needs:   . Film/video editor (Medical):   Marland Kitchen Lack of Transportation (Non-Medical):   Physical Activity:   . Days of Exercise per Week:   . Minutes of Exercise per Session:   Stress:   . Feeling of Stress :   Social Connections:   . Frequency of Communication with Friends and Family:   . Frequency of Social Gatherings with Friends and Family:   . Attends Religious Services:   . Active Member of Clubs or Organizations:   . Attends Archivist Meetings:   Marland Kitchen Marital Status:       Review of Systems  Constitutional: Negative.   Respiratory: Negative.   Gastrointestinal: Negative.   Genitourinary: Negative.   Musculoskeletal:       Bilateral hip pain during menstrual cycles.  Neurological: Negative.     Review of Systems: A 12 point ROS discussed and pertinent positives are indicated in the HPI above.  All other systems are negative.  Physical Exam No direct physical exam was performed (except for noted visual exam findings with Video Visits).   Vital Signs: There were no vitals  taken for this visit.  Imaging: MR PELVIS W WO CONTRAST  Result Date: 09/07/2019 CLINICAL DATA:  Status post [place] on 12/22/2018. Evaluate uterine fibroids. EXAM: MRI PELVIS WITHOUT AND WITH CONTRAST TECHNIQUE: Multiplanar multisequence MR imaging of the pelvis was performed both before and after administration of intravenous contrast. CONTRAST:  53m GADAVIST GADOBUTROL 1 MMOL/ML IV SOLN COMPARISON:  09/28/2018 FINDINGS: Urinary Tract: Normal urinary bladder. Bowel:  Normal imaged pelvic bowel loops. Vascular/Lymphatic: Given susceptibility artifact from bilateral hip arthroplasties, no pelvic sidewall aneurysm or adenopathy. Reproductive: Uterus: Measures 12.2 x 7.7 x 8.3 cm. Estimated volume = 405 cc. Decreased from 688 cc on the prior. Multiple uterine fibroids are again identified. An index right-sided fundal lesion measures 3.2 x 3.3 x 4.1 cm on 11/04 and 15/5. Compare 2.7 x 4.3 x 4.5 cm. A posterior uterine body/lower segment lesion measures 4.1 x 3.4 x 3.2 cm on 16/4 and 11/5. Compare 4.8 x 5.6 x 4.3 cm. Small nabothian cysts are identified.  The endometrium is normal. Intracavitary fibroids:  Absent Pedunculated fibroids: Absent Fibroid contrast enhancement: The majority of fibroids demonstrate no post-contrast enhancement, including on series 18. There are small fibroids which extend towards the left ovary, possibly in the broad ligament, including an enhancing 2.0 cm lesion on  06/18. These are decreased in size compared to the prior. Right ovary:  Normal, including on 10/05. Left ovary: Normal, including on 05/06. Fibroids extend towards the left ovary as detailed above. Other:  No significant free fluid. Musculoskeletal:  Disc desiccation at the lumbosacral junction. IMPRESSION: Response to therapy of uterine artery embolization, as evidenced by decreased size and primarily absent postcontrast enhancement of multiple lesions as detailed above. Electronically Signed   By: KAbigail MiyamotoM.D.   On: 09/07/2019 20:39    Labs:  CBC: Recent Labs    12/22/18 1242 02/23/19 0946  WBC 6.0 5.5  HGB 15.2* 15.4*  HCT 45.4 46.4*  PLT 225 238    COAGS: No results for input(s): INR, APTT in the last 8760 hours.  BMP: Recent Labs    12/22/18 1242 12/22/18 1242 12/22/18 1406 12/23/18 0450 02/23/19 0946 03/12/19 1536 09/07/19 1923  NA 142  --   --  137 141 140  --   K 2.9*   < > 3.0* 3.7 3.1* 3.5  --   CL 107  --   --  103 106 102  --   CO2 27  --   --  '23 28 27  ' --   GLUCOSE 95  --   --  135* 99 79  --   BUN 9  --   --  5* 8 5*  --   CALCIUM 8.8*  --   --  8.6* 8.6* 9.4  --   CREATININE 0.68   < >  --  0.69 0.71 0.82 0.80  GFRNONAA >60  --   --  >60 >60  --   --   GFRAA >60  --   --  >60 >60  --   --    < > = values in this interval not displayed.    LIVER FUNCTION TESTS: Recent Labs    03/12/19 1536  BILITOT 0.4  AST 14  ALT 11  PROT 7.2     Assessment and Plan:  I met with Rachel Warren over video software and was able to review and show imaging from the follow-up MRI of the pelvis performed on 09/07/2019.  This demonstrates a good imaging result after  fibroid embolization with decrease in uterine volume from approximately 688 mL prior to the procedure to 405 mL after the procedure.  The vast majority of uterine fibroids are now avascular and demonstrate no contrast enhancement.  There are some small left-sided fibroids which appear to be receiving some  blood flow.  No further routine uterine imaging is required unless there is a significant worsening of menorrhagia.  I told Rachel Warren that some of the avascular fibroid tissue may continue to decrease in size over the next several months.  I recommended she continue with routine health maintenance.   Electronically Signed: Azzie Roup 10/06/2019, 3:39 PM     I spent a total of 15 Minutes in remote  clinical consultation, greater than 50% of which was counseling/coordinating care post uterine fibroid embolization.    Visit type: Audio and video [employer]).   Alternative for in-person consultation at [facility], [place] [address], [place], [place]. This visit type was conducted due to national recommendations for restrictions regarding the COVID-19 Pandemic (e.g. social distancing).  This format is felt to be most appropriate for this patient at this time.  All issues noted in this document were discussed and addressed.

## 2019-11-03 ENCOUNTER — Other Ambulatory Visit: Payer: Self-pay | Admitting: Family Medicine

## 2019-11-03 DIAGNOSIS — D509 Iron deficiency anemia, unspecified: Secondary | ICD-10-CM

## 2019-11-03 MED ORDER — IRON 325 (65 FE) MG PO TABS: 1.0000 | ORAL_TABLET | Freq: Every day | ORAL | 0 refills | Status: DC

## 2019-11-15 ENCOUNTER — Other Ambulatory Visit: Payer: Self-pay | Admitting: Family Medicine

## 2019-11-15 ENCOUNTER — Encounter: Payer: Self-pay | Admitting: Family Medicine

## 2019-11-15 DIAGNOSIS — G8929 Other chronic pain: Secondary | ICD-10-CM

## 2019-11-15 MED ORDER — DULOXETINE HCL 30 MG PO CPEP: 30.0000 mg | ORAL_CAPSULE | Freq: Two times a day (BID) | ORAL | 0 refills | Status: DC

## 2020-01-04 ENCOUNTER — Encounter: Payer: Self-pay | Admitting: Family Medicine

## 2020-01-13 ENCOUNTER — Other Ambulatory Visit: Payer: Self-pay

## 2020-01-13 ENCOUNTER — Ambulatory Visit: Payer: BC Managed Care – PPO | Admitting: Family Medicine

## 2020-01-13 ENCOUNTER — Encounter: Payer: Self-pay | Admitting: Family Medicine

## 2020-01-13 VITALS — BP 130/96 | HR 77 | Temp 97.9°F | Wt 278.0 lb

## 2020-01-13 DIAGNOSIS — M7989 Other specified soft tissue disorders: Secondary | ICD-10-CM | POA: Diagnosis not present

## 2020-01-13 DIAGNOSIS — M25551 Pain in right hip: Secondary | ICD-10-CM

## 2020-01-13 DIAGNOSIS — M25562 Pain in left knee: Secondary | ICD-10-CM

## 2020-01-13 MED ORDER — PREDNISONE 10 MG PO TABS: ORAL_TABLET | ORAL | 0 refills | Status: DC

## 2020-01-13 MED ORDER — TRAMADOL HCL 50 MG PO TABS: 50.0000 mg | ORAL_TABLET | Freq: Two times a day (BID) | ORAL | 0 refills | Status: AC | PRN

## 2020-01-13 NOTE — Progress Notes (Signed)
Subjective:    Patient ID: Rachel Warren, female    DOB: [birth year], 44 y.o.   MRN: [id number]  No chief complaint on file.   HPI Pt is a86 yo female with pmh sig for HTN, osteoarthritis of bilateral hips s/p THR, back pain, history of depression, history of fibroids female was seen today for ongoing concern. Pt endorses left knee pain, right hip pain, and thumb pain. Pain and edema in left knee started 2 weeks ago when getting out of bed. Initially unable to put pressure on the knee.  Knee feels better in extension, worse with standing too long. Pt tried ice and Aleve.  Has h/o R hip bursitis. Pt noted as a toothache/throb. Pt denies back pain. Was in PT x2 years. Hip pain worse with menses and better with laying down.  Pt also notes stiffness in right thumb.  Past Medical History:  Diagnosis Date  . Anemia   . Arthritis   . Back pain    lower back   . Complication of anesthesia    age 46 had twilight anesthesia and woke up during surgery   . Depression   . Fatty liver 01/22/2016   Noted on MRI abd  . Fibroids   . Heavy periods   . History of gallstones   . History of thyroid nodule    Left  . Hypertension   . Irregular periods   . Obesity   . Pancreatic lesion   . Pinched nerve    in back  . Ventral hernia 12/26/2014   Midline noted on CT Abd pelvis    No Known Allergies  ROS General: Denies fever, chills, night sweats, changes in weight, changes in appetite HEENT: Denies headaches, ear pain, changes in vision, rhinorrhea, sore throat CV: Denies CP, palpitations, SOB, orthopnea Pulm: Denies SOB, cough, wheezing GI: Denies abdominal pain, nausea, vomiting, diarrhea, constipation GU: Denies dysuria, hematuria, frequency, vaginal discharge Msk: Denies muscle cramps  +joint pains Neuro: Denies weakness, numbness, tingling Skin: Denies rashes, bruising Psych: Denies depression, anxiety, hallucinations     Objective:    Blood pressure (!) 130/96, pulse 77,  temperature 97.9 F (36.6 C), temperature source Temporal, weight 278 lb (126.1 kg), SpO2 98 %.  Gen. Pleasant, well-nourished, in no distress, normal affect   HEENT: [place]/AT, face symmetric, no scleral icterus, PERRLA, EOMI, nares patent without drainage Lungs: no accessory muscle use, CTAB Cardiovascular: RRR, no m/r/g, no peripheral edema Abdomen: BS present, soft, NT/ND, no hepatosplenomegaly. Musculoskeletal: TTP of right hip.  +FADER of RLE.  Negative straight leg raise and logroll. R pelvis slightly higher than left during ambulation.  No deformities, no cyanosis or clubbing, normal tone.   Neuro:  A&Ox3, CN II-XII intact, normal exam Skin:  Warm, no lesions/ rash   Wt Readings from Last 3 Encounters:  01/13/20 278 lb (126.1 kg)  04/07/19 253 lb (114.8 kg)  02/23/19 254 lb (115.2 kg)    Lab Results  Component Value Date   WBC 5.5 02/23/2019   HGB 15.4 (H) 02/23/2019   HCT 46.4 (H) 02/23/2019   PLT 238 02/23/2019   GLUCOSE 79 03/12/2019   CHOL 175 01/14/2018   TRIG 164.0 (H) 01/14/2018   HDL 47.50 01/14/2018   LDLCALC 95 01/14/2018   ALT 11 03/12/2019   AST 14 03/12/2019   NA 140 03/12/2019   K 3.5 03/12/2019   CL 102 03/12/2019   CREATININE 0.80 09/07/2019   BUN 5 (L) 03/12/2019   CO2 27 03/12/2019  TSH 1.52 03/12/2019   INR 1.00 02/22/2015   HGBA1C 4.8 01/14/2018    Assessment/Plan:  Swelling of thumb, right  -discussed possible causes including arthritis, consider autoimmune d/o given pain in multiple joints -Discussed supportive care including NSAIDs, rest, ice, heat, topical analgesic rubs - Plan: CBC with Differential/Platelet, C-reactive Protein, Sedimentation Rate, Rheumatoid Factor, Lupus anticoagulant panel  Right hip pain  -s/p THR -Follow-up with Ortho recommended for continued or worsening symptoms given history of prior surgery - Plan: predniSONE (DELTASONE) 10 MG tablet  Acute pain of left knee -Likely 2/2 change in gait due to right hip  pain -Discussed supportive care including ice, heat, rest, compression, elevation. -consider imaging -f/u with Ortho for continued or worsening symptoms - Plan: predniSONE (DELTASONE) 10 MG tablet, traMADol (ULTRAM) 50 MG tablet  F/u as needed in the next few weeks  Grier Mitts, MD

## 2020-01-13 NOTE — Patient Instructions (Signed)
Acute Knee Pain, Adult Acute knee pain is sudden and may be caused by damage, swelling, or irritation of the muscles and tissues that support your knee. The injury may result from:  A fall.  An injury to your knee from twisting motions.  A hit to the knee.  Infection. Acute knee pain may go away on its own with time and rest. If it does not, your health care provider may order tests to find the cause of the pain. These may include:  Imaging tests, such as an X-ray, MRI, or ultrasound.  Joint aspiration. In this test, fluid is removed from the knee.  Arthroscopy. In this test, a lighted tube is inserted into the knee and an image is projected onto a TV screen.  Biopsy. In this test, a sample of tissue is removed from the body and studied under a microscope. Follow these instructions at home: Pay attention to any changes in your symptoms. Take these actions to relieve your pain. If you have a knee sleeve or brace:   Wear the sleeve or brace as told by your health care provider. Remove it only as told by your health care provider.  Loosen the sleeve or brace if your toes tingle, become numb, or turn cold and blue.  Keep the sleeve or brace clean.  If the sleeve or brace is not waterproof: ? Do not let it get wet. ? Cover it with a watertight covering when you take a bath or shower. Activity  Rest your knee.  Do not do things that cause pain or make pain worse.  Avoid high-impact activities or exercises, such as running, jumping rope, or doing jumping jacks.  Work with a physical therapist to make a safe exercise program, as recommended by your health care provider. Do exercises as told by your physical therapist. Managing pain, stiffness, and swelling   If directed, put ice on the knee: ? Put ice in a plastic bag. ? Place a towel between your skin and the bag. ? Leave the ice on for 20 minutes, 2-3 times a day.  If directed, use an elastic bandage to put pressure  (compression) on your injured knee. This may control swelling, give support, and help with discomfort. General instructions  Take over-the-counter and prescription medicines only as told by your health care provider.  Raise (elevate) your knee above the level of your heart when you are sitting or lying down.  Sleep with a pillow under your knee.  Do not use any products that contain nicotine or tobacco, such as cigarettes, e-cigarettes, and chewing tobacco. These can delay healing. If you need help quitting, ask your health care provider.  If you are overweight, work with your health care provider and a dietitian to set a weight-loss goal that is healthy and reasonable for you. Extra weight can put pressure on your knee.  Keep all follow-up visits as told by your health care provider. This is important. Contact a health care provider if:  Your knee pain continues, changes, or gets worse.  You have a fever along with knee pain.  Your knee feels warm to the touch.  Your knee buckles or locks up. Get help right away if:  Your knee swells, and the swelling becomes worse.  You cannot move your knee.  You have severe pain in your knee. Summary  Acute knee pain can be caused by a fall, an injury, an infection, or damage, swelling, or irritation of the tissues that support your knee.  Your health care provider may perform tests to find out the cause of the pain.  Pay attention to any changes in your symptoms. Relieve your pain with rest, medicines, light activity, and use of ice.  Get help if your pain continues or becomes worse, your knee swells, or you cannot move your knee. This information is not intended to replace advice given to you by your health care provider. Make sure you discuss any questions you have with your health care provider. Document Revised: 12/11/2017 Document Reviewed: 12/11/2017 Elsevier Patient Education  2020 [place] is a term  that is commonly used to refer to joint pain or joint disease. There are more than 100 types of arthritis. What are the causes? The most common cause of this condition is wear and tear of a joint. Other causes include:  Gout.  Inflammation of a joint.  An infection of a joint.  Sprains and other injuries near the joint.  A reaction to medicines or drugs, or an allergic reaction. In some cases, the cause may not be known. What are the signs or symptoms? The main symptom of this condition is pain in the joint during movement. Other symptoms include:  Redness, swelling, or stiffness at a joint.  Warmth coming from the joint.  Fever.  Overall feeling of illness. How is this diagnosed? This condition may be diagnosed with a physical exam and tests, including:  Blood tests.  Urine tests.  Imaging tests, such as X-rays, an MRI, or a CT scan. Sometimes, fluid is removed from a joint for testing. How is this treated? This condition may be treated with:  Treatment of the cause, if it is known.  Rest.  Raising (elevating) the joint.  Applying cold or hot packs to the joint.  Medicines to improve symptoms and reduce inflammation.  Injections of a steroid such as cortisone into the joint to help reduce pain and inflammation. Depending on the cause of your arthritis, you may need to make lifestyle changes to reduce stress on your joint. Changes may include:  Exercising more.  Losing weight. Follow these instructions at home: Medicines  Take over-the-counter and prescription medicines only as told by your health care provider.  Do not take aspirin to relieve pain if your health care provider thinks that gout may be causing your pain. Activity  Rest your joint if told by your health care provider. Rest is important when your disease is active and your joint feels painful, swollen, or stiff.  Avoid activities that make the pain worse. It is important to balance activity  with rest.  Exercise your joint regularly with range-of-motion exercises as told by your health care provider. Try doing low-impact exercise, such as: ? Swimming. ? Water aerobics. ? Biking. ? Walking. Managing pain, stiffness, and swelling      If directed, put ice on the joint. ? Put ice in a plastic bag. ? Place a towel between your skin and the bag. ? Leave the ice on for 20 minutes, 2-3 times per day.  If your joint is swollen, raise (elevate) it above the level of your heart if directed by your health care provider.  If your joint feels stiff in the morning, try taking a warm shower.  If directed, apply heat to the affected area as often as told by your health care provider. Use the heat source that your health care provider recommends, such as a moist heat pack or a heating pad. If you have  diabetes, do not apply heat without permission from your health care provider. To apply heat: ? Place a towel between your skin and the heat source. ? Leave the heat on for 20-30 minutes. ? Remove the heat if your skin turns bright red. This is especially important if you are unable to feel pain, heat, or cold. You may have a greater risk of getting burned. General instructions  Do not use any products that contain nicotine or tobacco, such as cigarettes, e-cigarettes, and chewing tobacco. If you need help quitting, ask your health care provider.  Keep all follow-up visits as told by your health care provider. This is important. Contact a health care provider if:  The pain gets worse.  You have a fever. Get help right away if:  You develop severe joint pain, swelling, or redness.  Many joints become painful and swollen.  You develop severe back pain.  You develop severe weakness in your leg.  You cannot control your bladder or bowels. Summary  Arthritis is a term that is commonly used to refer to joint pain or joint disease. There are more than 100 types of arthritis.  The  most common cause of this condition is wear and tear of a joint. Other causes include gout, inflammation or infection of the joint, sprains, or allergies.  Symptoms of this condition include redness, swelling, or stiffness of the joint. Other symptoms include warmth, fever, or feeling ill.  This condition is treated with rest, elevation, medicines, and applying cold or hot packs.  Follow your health care provider's instructions about medicines, activity, exercises, and other home care treatments. This information is not intended to replace advice given to you by your health care provider. Make sure you discuss any questions you have with your health care provider. Document Revised: 06/08/2018 Document Reviewed: 06/08/2018 Elsevier Patient Education  [place].  Rheumatoid Arthritis Rheumatoid arthritis (RA) is a long-term (chronic) disease that causes inflammation in your joints. RA may start slowly. It most often affects the small joints of the hands and feet. Usually, the same joints are affected on both sides of your body. Inflammation from RA can also affect other parts of your body, including your heart, eyes, or lungs. There is no cure for RA, but medicines can help your symptoms and halt or slow down the progression of the disease. What are the causes? RA is an autoimmune disease. When you have an autoimmune disease, your body's defense system (immune system) mistakenly attacks healthy body tissues. The exact cause of RA is not known. What increases the risk? You are more likely to develop this condition if you:  Are a woman.  Have a family history of RA or other autoimmune diseases.  Have a history of smoking.  Are obese.  Have been exposed to pollutants or chemicals. What are the signs or symptoms? The first symptom of this condition may be morning stiffness that lasts longer than 30 minutes.  Symptoms usually start gradually. They are often worse in the morning. As  RA progresses, symptoms may include:  Pain, stiffness, swelling, warmth, and tenderness in joints on both sides of your body.  Loss of energy.  Loss of appetite.  Weight loss.  Low-grade fever.  Dry eyes and dry mouth.  Firm lumps (rheumatoid nodules) that grow beneath your skin in areas such as your forearm bones near your elbows and on your hands.  Changes in the appearance of joints (deformity) and loss of joint function. Symptoms of this  condition vary from person to person.  Symptoms of RA often come and go.  Sometimes, symptoms get worse for a period of time. These are called flares. How is this diagnosed? This condition is diagnosed based on your symptoms, medical history, and physical exam.  You may have X-rays or an MRI to check for the type of joint changes that are caused by RA. You may also have blood tests to look for:  Proteins (antibodies) that your immune system may make if you have RA. These include rheumatoid factor (RF) and anti-CCP. ? When blood tests show these proteins, you are said to have "seropositive RA." ? When blood tests do not show these proteins, you may have "seronegative RA."  Inflammation in your blood.  A low number of red blood cells (anemia). How is this treated? The goals of treatment are to relieve pain, reduce inflammation, and slow down or stop joint damage and disability. Treatment may include:  Lifestyle changes. It is important to rest as needed, eat a healthy diet, and exercise.  Medicines. Your health care provider may adjust your medicines every 3 months until treatment goals are reached. Common medicines include: ? Pain relievers (analgesics). ? Corticosteroids and NSAIDs to reduce inflammation. ? Disease-modifying antirheumatic drugs (DMARDs) to try to slow the course of the disease. ? Biologic response modifiers to reduce inflammation and damage.  Physical therapy and occupational therapy.  Surgery, if you have severe  joint damage. Joint replacement or fusing of joints may be needed. Your health care provider will work with you to identify the best treatment option for you based on assessment of the overall disease activity in your body. Follow these instructions at home: Activity  Return to your normal activities as told by your health care provider. Ask your health care provider what activities are safe for you.  Rest when you are having a flare.  Start an exercise program as told by your health care provider. General instructions  Keep all follow-up visits as told by your health care provider. This is important.  Take over-the-counter and prescription medicines only as told by your health care provider. Where to find more information  [employer] [facility] [employer]: www.rheumatology.[place]: www.arthritis.org Contact a health care provider if:  You have a flare-up of RA symptoms.  You have a fever.  You have side effects from your medicines. Get help right away if:  You have chest pain.  You have trouble breathing.  You quickly develop a hot, painful joint that is more severe than your usual joint aches. Summary  Rheumatoid arthritis (RA) is a long-term (chronic) disease that causes inflammation in your joints.  RA is an autoimmune disease.  The goals of treatment are to relieve pain, reduce inflammation, and slow down or stop joint damage and disability. This information is not intended to replace advice given to you by your health care provider. Make sure you discuss any questions you have with your health care provider. Document Revised: 01/12/2019 Document Reviewed: 03/03/2018 Elsevier Patient Education  2020 [employer].

## 2020-01-14 LAB — CBC WITH DIFFERENTIAL/PLATELET
Basophils Absolute: 0.1 10*3/uL (ref 0.0–0.1)
Basophils Relative: 1.1 % (ref 0.0–3.0)
Eosinophils Absolute: 0 10*3/uL (ref 0.0–0.7)
Eosinophils Relative: 0.1 % (ref 0.0–5.0)
HCT: 41.3 % (ref 36.0–46.0)
Hemoglobin: 14.1 g/dL (ref 12.0–15.0)
Lymphocytes Relative: 24.1 % (ref 12.0–46.0)
Lymphs Abs: 1.8 10*3/uL (ref 0.7–4.0)
MCHC: 34 g/dL (ref 30.0–36.0)
MCV: 94.1 fl (ref 78.0–100.0)
Monocytes Absolute: 0.4 10*3/uL (ref 0.1–1.0)
Monocytes Relative: 5.6 % (ref 3.0–12.0)
Neutro Abs: 5.1 10*3/uL (ref 1.4–7.7)
Neutrophils Relative %: 69.1 % (ref 43.0–77.0)
Platelets: 235 10*3/uL (ref 150.0–400.0)
RBC: 4.39 Mil/uL (ref 3.87–5.11)
RDW: 13.7 % (ref 11.5–15.5)
WBC: 7.4 10*3/uL (ref 4.0–10.5)

## 2020-01-14 LAB — C-REACTIVE PROTEIN: CRP: <1 mg/dL (ref 0.5–20.0)

## 2020-01-14 LAB — RHEUMATOID FACTOR: Rheumatoid fact SerPl-aCnc: <14 IU/mL (ref <14)

## 2020-01-14 LAB — SEDIMENTATION RATE: Sed Rate: 15 mm/hr (ref 0–20)

## 2020-01-17 LAB — LUPUS ANTICOAGULANT PANEL
Dilute Viper Venom Time: 35 s (ref 0.0–47.0)
PTT Lupus Anticoagulant: 33.7 s (ref 0.0–51.9)

## 2020-01-22 ENCOUNTER — Encounter: Payer: Self-pay | Admitting: Family Medicine

## 2020-01-23 IMAGING — DX DG HIP (WITH OR WITHOUT PELVIS) 3-4V BILAT
3 series · 3 of 3 positions shown · non-contrast
Comparison: None.

CLINICAL DATA: Pain

EXAM:
DG HIP (WITH OR WITHOUT PELVIS) 3-4V BILAT

[pelvis ap]
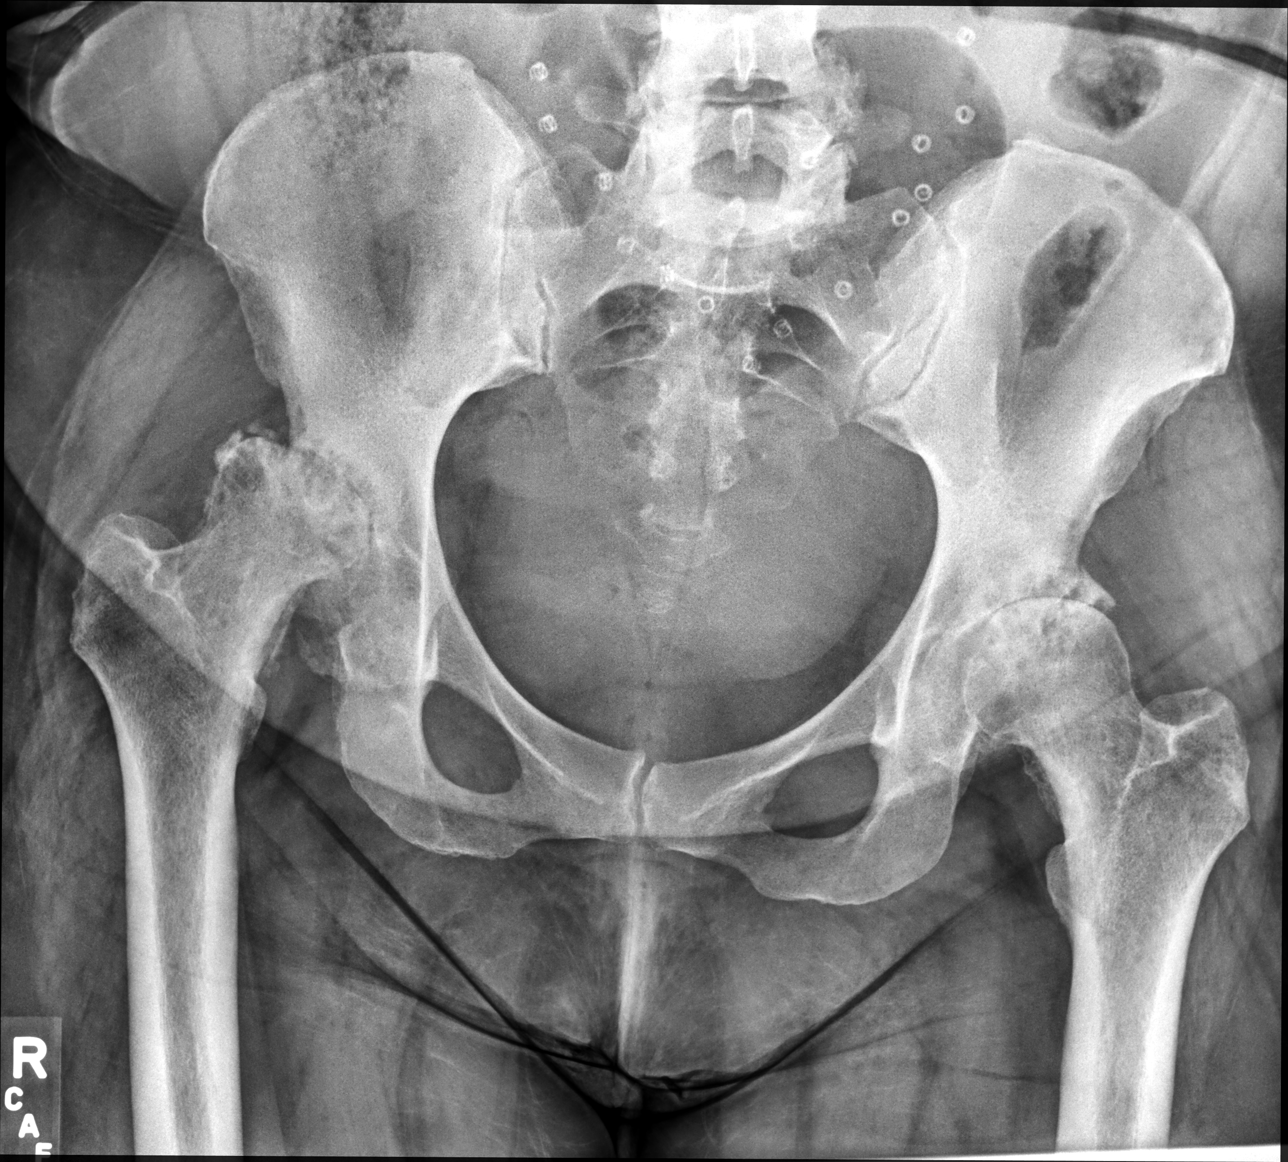

[hip joint (frog view) (1 of 2)]
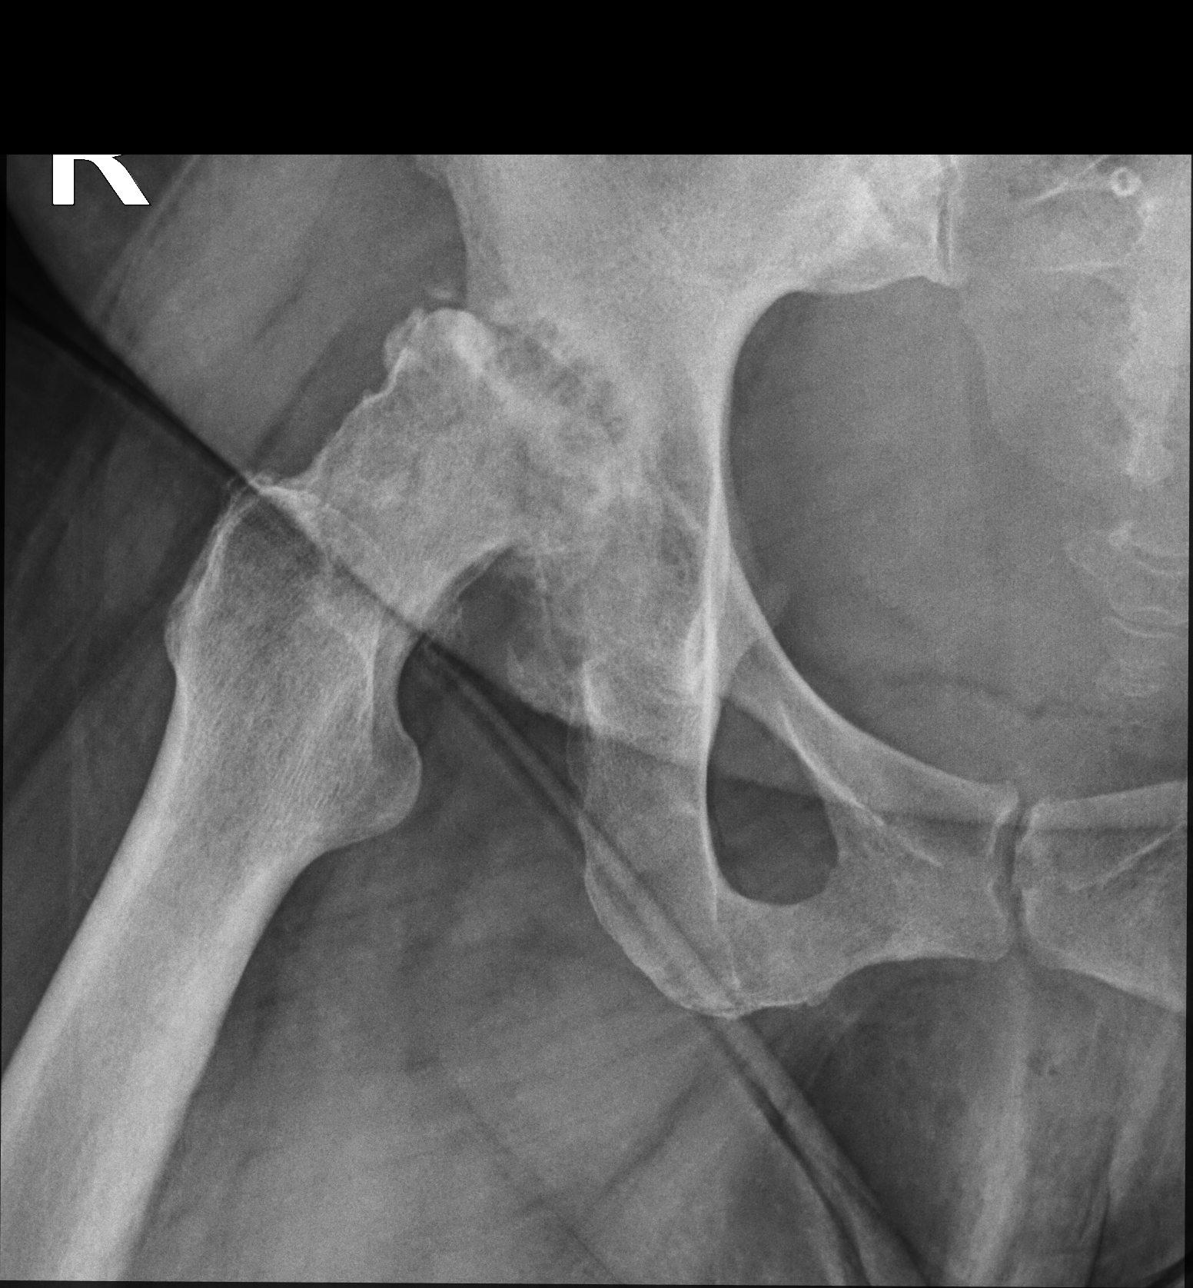

[hip joint (frog view) (2 of 2)]
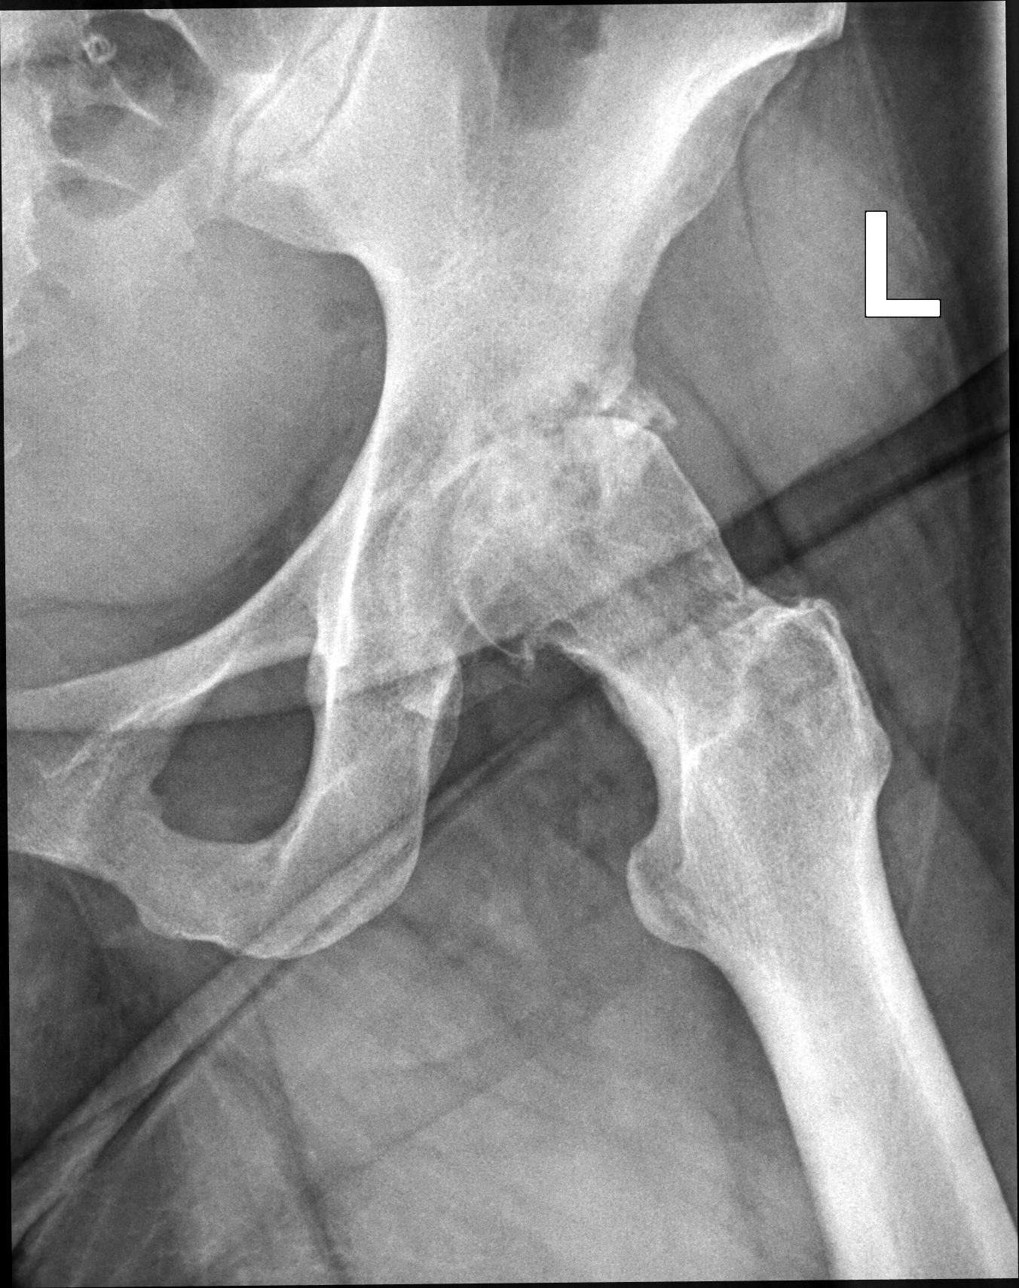

[3 of 3 positions shown; findings below may reference images not displayed]

FINDINGS: Frontal pelvis as well as lateral hip joints bilaterally obtained.
There is advanced arthropathy in both hip joints, more severe on the
right than on the left. There is remodeling of the right femoral
head with advanced avascular necrosis in the right femoral head.
There is subchondral cystic change on both sides. There is mild
fragmentation with several calcifications along the lateral aspect
of the right hip joint. On the left, there is marked narrowing of
the joint with subchondral cystic change bilaterally. There is
suspected degree of avascular necrosis in the mid femoral head
region on the left with out significant flattening.

No acute fracture or dislocation. Sacroiliac joints appear normal
bilaterally.
IMPRESSION: Advanced arthropathy in both hip joints, more severe on the right
than on the left. There is advanced avascular necrosis in the right
femoral head with flattening and extensive subchondral cystic
change. Mild fragmentation along the lateral right hip joint noted.
On the left, there is suspected avascular necrosis of the femoral
head with marked narrowing of the hip joint and generalized
subchondral cystic change on both sides of the joint. There is no
flattening of the left femoral head, however.

No acute fracture or dislocation.

## 2020-02-07 ENCOUNTER — Ambulatory Visit: Payer: BC Managed Care – PPO | Admitting: Family Medicine

## 2020-02-07 ENCOUNTER — Encounter: Payer: Self-pay | Admitting: Family Medicine

## 2020-02-07 ENCOUNTER — Other Ambulatory Visit: Payer: Self-pay

## 2020-02-07 VITALS — BP 144/82 | HR 100 | Temp 98.5°F | Wt 269.5 lb

## 2020-02-07 DIAGNOSIS — M5416 Radiculopathy, lumbar region: Secondary | ICD-10-CM

## 2020-02-07 DIAGNOSIS — R292 Abnormal reflex: Secondary | ICD-10-CM

## 2020-02-07 MED ORDER — GABAPENTIN 300 MG PO CAPS: 300.0000 mg | ORAL_CAPSULE | Freq: Three times a day (TID) | ORAL | 1 refills | Status: DC

## 2020-02-07 NOTE — Patient Instructions (Signed)
Radicular Pain Radicular pain is a type of pain that spreads from your back or neck along a spinal nerve. Spinal nerves are nerves that leave the spinal cord and go to the muscles. Radicular pain is sometimes called radiculopathy, radiculitis, or a pinched nerve. When you have this type of pain, you may also have weakness, numbness, or tingling in the area of your body that is supplied by the nerve. The pain may feel sharp and burning. Depending on which spinal nerve is affected, the pain may occur in the:  Neck area (cervical radicular pain). You may also feel pain, numbness, weakness, or tingling in the arms.  Mid-spine area (thoracic radicular pain). You would feel this pain in the back and chest. This type is rare.  Lower back area (lumbar radicular pain). You would feel this pain as low back pain. You may feel pain, numbness, weakness, or tingling in the buttocks or legs. Sciatica is a type of lumbar radicular pain that shoots down the back of the leg. Radicular pain occurs when one of the spinal nerves becomes irritated or squeezed (compressed). It is often caused by something pushing on a spinal nerve, such as one of the bones of the spine (vertebrae) or one of the round cushions between vertebrae (intervertebral disks). This can result from:  An injury.  Wear and tear or aging of a disk.  The growth of a bone spur that pushes on the nerve. Radicular pain often goes away when you follow instructions from your health care provider for relieving pain at home. Follow these instructions at home: Managing pain      If directed, put ice on the affected area: ? Put ice in a plastic bag. ? Place a towel between your skin and the bag. ? Leave the ice on for 20 minutes, 2-3 times a day.  If directed, apply heat to the affected area as often as told by your health care provider. Use the heat source that your health care provider recommends, such as a moist heat pack or a heating pad. ? Place  a towel between your skin and the heat source. ? Leave the heat on for 20-30 minutes. ? Remove the heat if your skin turns bright red. This is especially important if you are unable to feel pain, heat, or cold. You may have a greater risk of getting burned. Activity   Do not sit or rest in bed for long periods of time.  Try to stay as active as possible. Ask your health care provider what type of exercise or activity is best for you.  Avoid activities that make your pain worse, such as bending and lifting.  Do not lift anything that is heavier than 10 lb (4.5 kg), or the limit that you are told, until your health care provider says that it is safe.  Practice using proper technique when lifting items. Proper lifting technique involves bending your knees and rising up.  Do strength and range-of-motion exercises only as told by your health care provider or physical therapist. General instructions  Take over-the-counter and prescription medicines only as told by your health care provider.  Pay attention to any changes in your symptoms.  Keep all follow-up visits as told by your health care provider. This is important. ? Your health care provider may send you to a physical therapist to help with this pain. Contact a health care provider if:  Your pain and other symptoms get worse.  Your pain medicine is not   helping.  Your pain has not improved after a few weeks of home care.  You have a fever. Get help right away if:  You have severe pain, weakness, or numbness.  You have difficulty with bladder or bowel control. Summary  Radicular pain is a type of pain that spreads from your back or neck along a spinal nerve.  When you have radicular pain, you may also have weakness, numbness, or tingling in the area of your body that is supplied by the nerve.  The pain may feel sharp or burning.  Radicular pain may be treated with ice, heat, medicines, or physical therapy. This  information is not intended to replace advice given to you by your health care provider. Make sure you discuss any questions you have with your health care provider. Document Revised: 01/13/2018 Document Reviewed: 01/13/2018 Elsevier Patient Education  2020 [employer]  

## 2020-02-07 NOTE — Progress Notes (Signed)
Established Patient Office Visit  Subjective:  Patient ID: Rachel Warren, female    DOB: [birth year]  Age: 44 y.o. MRN: [id number]  CC:  Chief Complaint  Patient presents with  . Leg Pain    right leg pain     HPI Rachel Warren presents for right lower extremity pain.  She has had some various joint pains recently but over the past several weeks has had progressive pain radiating from her buttock all the way down to her foot.  She is having some numbness on the bottom of her foot and frequent tingling into her toes.  Her pain has been fairly intense at times.  She was recently placed on some prednisone which helped slightly and she has gotten minimal relief with tramadol.  Back in 2018 she had MRI of the lumbar spine which showed disc protrusion L5-S1 with pressure reassess 1 nerve root on the right side.  She denies any recent injury.  She has tried heat and ice without relief.  She apparently had some type of injection couple years ago which helped temporarily.  She had physical therapy that time.  Denies any loss of urine or stool control.  She has tried some stretches recently without improvement as well.  Her pain has been progressive over the past several weeks  Past Medical History:  Diagnosis Date  . Anemia   . Arthritis   . Back pain    lower back   . Complication of anesthesia    age 27 had twilight anesthesia and woke up during surgery   . Depression   . Fatty liver 01/22/2016   Noted on MRI abd  . Fibroids   . Heavy periods   . History of gallstones   . History of thyroid nodule    Left  . Hypertension   . Irregular periods   . Obesity   . Pancreatic lesion   . Pinched nerve    in back  . Ventral hernia 12/26/2014   Midline noted on CT Abd pelvis    Past Surgical History:  Procedure Laterality Date  . benign breast cyst removed Left    age 31  . CHOLECYSTECTOMY    . INSERTION OF MESH N/A 02/27/2015   Procedure: INSERTION OF MESH;   Surgeon: Jackolyn Confer, MD;  Location: [facility];  Service: General;  Laterality: N/A;  . IR ANGIOGRAM PELVIS SELECTIVE OR SUPRASELECTIVE  12/22/2018  . IR ANGIOGRAM PELVIS SELECTIVE OR SUPRASELECTIVE  12/22/2018  . IR ANGIOGRAM SELECTIVE EACH ADDITIONAL VESSEL  12/22/2018  . IR ANGIOGRAM SELECTIVE EACH ADDITIONAL VESSEL  12/22/2018  . IR EMBO TUMOR ORGAN ISCHEMIA INFARCT INC GUIDE ROADMAPPING  12/22/2018  . IR RADIOLOGIST EVAL & MGMT  09/17/2018  . IR RADIOLOGIST EVAL & MGMT  01/05/2019  . IR RADIOLOGIST EVAL & MGMT  10/06/2019  . IR US GUIDE VASC ACCESS RIGHT  12/22/2018  . LAPAROSCOPIC ASSISTED VENTRAL HERNIA REPAIR N/A 02/27/2015   Procedure: LAPAROSCOPIC VENTRAL HERNIA REPAIR WITH MESH;  Surgeon: Jackolyn Confer, MD;  Location: [facility];  Service: General;  Laterality: N/A;  . THYROIDECTOMY, PARTIAL    . TOTAL HIP ARTHROPLASTY Right 11/28/2017   Procedure: RIGHT TOTAL HIP ARTHROPLASTY ANTERIOR APPROACH;  Surgeon: Mcarthur Rossetti, MD;  Location: [facility];  Service: Orthopedics;  Laterality: Right;  . TOTAL HIP ARTHROPLASTY Left 07/31/2018   Procedure: LEFT TOTAL HIP ARTHROPLASTY ANTERIOR APPROACH;  Surgeon: Mcarthur Rossetti, MD;  Location: [facility];  Service: Orthopedics;  Laterality: Left;  Failed Spinal    Family History  Problem Relation Age of Onset  . Hypertension Father   . Arthritis Father   . Clotting disorder Father   . Prostate cancer Paternal Grandfather     Social History   Socioeconomic History  . Marital status: Married    Spouse name: Not on file  . Number of children: Not on file  . Years of education: Not on file  . Highest education level: Not on file  Occupational History  . Not on file  Tobacco Use  . Smoking status: Former Smoker    Types: Cigarettes    Quit date: 06/14/2017    Years since quitting: 2.6  . Smokeless tobacco: Never Used  Vaping Use  . Vaping Use: Never used  Substance and Sexual Activity  . Alcohol use: Yes    Alcohol/week: 2.0 - 3.0  standard drinks    Types: 2 - 3 Glasses of wine per week  . Drug use: Yes    Types: Marijuana    Comment: last time 07/08/2018  . Sexual activity: Yes    Birth control/protection: Condom  Other Topics Concern  . Not on file  Social History Narrative  . Not on file   Social Determinants of Health   Financial Resource Strain:   . Difficulty of Paying Living Expenses:   Food Insecurity:   . Worried About Charity fundraiser in the Last Year:   . Arboriculturist in the Last Year:   Transportation Needs:   . Film/video editor (Medical):   Marland Kitchen Lack of Transportation (Non-Medical):   Physical Activity:   . Days of Exercise per Week:   . Minutes of Exercise per Session:   Stress:   . Feeling of Stress :   Social Connections:   . Frequency of Communication with Friends and Family:   . Frequency of Social Gatherings with Friends and Family:   . Attends Religious Services:   . Active Member of Clubs or Organizations:   . Attends Archivist Meetings:   Marland Kitchen Marital Status:   Intimate Partner Violence:   . Fear of Current or Ex-Partner:   . Emotionally Abused:   Marland Kitchen Physically Abused:   . Sexually Abused:     Outpatient Medications Prior to Visit  Medication Sig Dispense Refill  . amLODipine (NORVASC) 10 MG tablet Take 1 tablet (10 mg total) by mouth daily. 90 tablet 3  . cyclobenzaprine (FLEXERIL) 10 MG tablet Take 1 tablet (10 mg total) by mouth at bedtime. 30 tablet 1  . diclofenac sodium (VOLTAREN) 1 % GEL Apply 2 g topically 4 (four) times daily. (Patient taking differently: Apply 2 g topically 4 (four) times daily as needed (pain). ) 100 g 3  . DULoxetine (CYMBALTA) 30 MG capsule Take 1 capsule (30 mg total) by mouth 2 (two) times daily. 180 capsule 0  . Ferrous Sulfate (IRON) 325 (65 Fe) MG TABS Take 1 tablet (325 mg total) by mouth daily. 90 tablet 0  . metoprolol succinate (TOPROL-XL) 50 MG 24 hr tablet Take 1 tablet (50 mg total) by mouth daily. Take with or  immediately following a meal. 90 tablet 3  . naproxen sodium (ALEVE) 220 MG tablet Take 220 mg by mouth as needed (pain/headache).     . predniSONE (DELTASONE) 10 MG tablet Take 5 tabs on day 1, 4 tabs on day 2, 3 tabs on day 3, 2 tabs on day 4, 1 tab on day 5. 15  tablet 0  . potassium chloride SA (K-DUR) 20 MEQ tablet Take 1 tablet (20 mEq total) by mouth daily for 5 days. Take with meals and a full glass of water. 5 tablet 0   No facility-administered medications prior to visit.    No Known Allergies  ROS Review of Systems  Constitutional: Negative for appetite change, chills, fever and unexpected weight change.  Respiratory: Negative for shortness of breath.   Cardiovascular: Negative for chest pain.  Musculoskeletal: Positive for back pain.  Neurological: Negative for numbness.      Objective:    Physical Exam Vitals reviewed.  Constitutional:      Appearance: Normal appearance.  Cardiovascular:     Rate and Rhythm: Normal rate and regular rhythm.  Pulmonary:     Effort: Pulmonary effort is normal.     Breath sounds: Normal breath sounds.  Musculoskeletal:     Right lower leg: No edema.     Left lower leg: No edema.     Comments: Straight leg raise is positive on the right.  No pitting edema.  Neurological:     Mental Status: She is alert.     Comments: She has pain with plantarflexion and dorsiflexion on the right but no definite weakness.  She has diminished ankle reflex on the right and normal on the left.  Knee reflexes are symmetric     BP (!) 144/82 (BP Location: Left Arm, Patient Position: Sitting, Cuff Size: Normal)   Pulse 100   Temp 98.5 F (36.9 C) (Oral)   Wt (!) 269 lb 8 oz (122.2 kg)   SpO2 97%   BMI 40.98 kg/m  Wt Readings from Last 3 Encounters:  02/07/20 (!) 269 lb 8 oz (122.2 kg)  01/13/20 278 lb (126.1 kg)  04/07/19 253 lb (114.8 kg)     Health Maintenance Due  Topic Date Due  . COVID-19 Vaccine (1) Never done    There are no  preventive care reminders to display for this patient.  Lab Results  Component Value Date   TSH 1.52 03/12/2019   Lab Results  Component Value Date   WBC 7.4 01/13/2020   HGB 14.1 01/13/2020   HCT 41.3 01/13/2020   MCV 94.1 01/13/2020   PLT 235.0 01/13/2020   Lab Results  Component Value Date   NA 140 03/12/2019   K 3.5 03/12/2019   CO2 27 03/12/2019   GLUCOSE 79 03/12/2019   BUN 5 (L) 03/12/2019   CREATININE 0.80 09/07/2019   BILITOT 0.4 03/12/2019   ALKPHOS 56 02/22/2015   AST 14 03/12/2019   ALT 11 03/12/2019   PROT 7.2 03/12/2019   ALBUMIN 3.7 02/22/2015   CALCIUM 9.4 03/12/2019   ANIONGAP 7 02/23/2019   GFR 119.25 01/14/2018   Lab Results  Component Value Date   CHOL 175 01/14/2018   Lab Results  Component Value Date   HDL 47.50 01/14/2018   Lab Results  Component Value Date   LDLCALC 95 01/14/2018   Lab Results  Component Value Date   TRIG 164.0 (H) 01/14/2018   Lab Results  Component Value Date   CHOLHDL 4 01/14/2018   Lab Results  Component Value Date   HGBA1C 4.8 01/14/2018      Assessment & Plan:   Problem List Items Addressed This Visit    None    Visit Diagnoses    Right lumbar radiculitis    -  Primary   Relevant Medications   gabapentin (NEURONTIN) 300 MG capsule  Decreased right ankle reflex        Patient has past history of disc protrusion L5-S1 with right S1 nerve root impingement.  She has symptoms and exam findings to suggest probably similar level of involvement.  Suspect L5-S1 involvement.  She has neurologic finding of decreased ankle reflex and has not gotten much relief with conservative measures above  -Cautious trial of gabapentin.  She will start gabapentin 300 mg nightly.  She will increase gradually to 300 mg 3 times daily if tolerated but we reviewed potential side effects such as sedation and dizziness -Set up MRI lumbar spine to further assess -Follow-up immediately for any urine or stool incontinence or any  progressive weakness or numbness  Meds ordered this encounter  Medications  . gabapentin (NEURONTIN) 300 MG capsule    Sig: Take 1 capsule (300 mg total) by mouth 3 (three) times daily.    Dispense:  90 capsule    Refill:  1    Follow-up: No follow-ups on file.    Carolann Littler, MD

## 2020-02-09 ENCOUNTER — Other Ambulatory Visit: Payer: Self-pay | Admitting: Family Medicine

## 2020-02-09 DIAGNOSIS — D509 Iron deficiency anemia, unspecified: Secondary | ICD-10-CM

## 2020-02-09 MED ORDER — IRON 325 (65 FE) MG PO TABS: 1.0000 | ORAL_TABLET | Freq: Every day | ORAL | 0 refills | Status: DC

## 2020-02-15 NOTE — Progress Notes (Signed)
44 y.o. G30P0001 Married [place] American female here for annual exam.   Patient complaining of heavy/painful cycles.  Cycles were shorter for while and with less heavy days.  She can have spotting a week or two before her cycle.  Never lasts for more than 2 days.  Benign EMB in 2019.   Had uterine artery embolization 12/22/18 for fibroids.   Her uterine size at her annual exam last year was 13 weeks size.  Hgb 14.1 on 01/13/20.  She saw her PCP for leg pain.  She is taking Gabapentin.  She will have an MRI next week.  Had her Covid vaccine.   PCP:  Grier Mitts, MD   Patient's last menstrual period was 01/30/2020 (exact date).           Sexually active: No.  The current method of family planning is abstinence.    Exercising: No.  Physical therapy stretches, squats Smoker:  no  Health Maintenance: Pap: 01/22/18 Neg:Neg HR HPV, 02-08-13 Neg,  History of abnormal Pap:  Yes, remote past MMG: 02-23-19 3D/Neg/density A/BiRads1 Colonoscopy:  n/a BMD:   n/a  Result  n/a TDaP:  01-14-18 Gardasil:   no HIV:01-22-18 NR Hep C:01-22-18 Neg Screening Labs:   PCP.    reports that she quit smoking about 2 years ago. Her smoking use included cigarettes. She has never used smokeless tobacco. She reports current alcohol use of about 2.0 - 3.0 standard drinks of alcohol per week. She reports current drug use. Drug: Marijuana.  Past Medical History:  Diagnosis Date  . Anemia   . Arthritis   . Back pain    lower back   . Complication of anesthesia    age 42 had twilight anesthesia and woke up during surgery   . Depression   . Fatty liver 01/22/2016   Noted on MRI abd  . Fibroids   . Heavy periods   . History of gallstones   . History of thyroid nodule    Left  . Hypertension   . Irregular periods   . Obesity   . Pancreatic lesion   . Pinched nerve    in back  . Ventral hernia 12/26/2014   Midline noted on CT Abd pelvis    Past Surgical History:  Procedure Laterality Date  .  benign breast cyst removed Left    age 44  . CHOLECYSTECTOMY    . INSERTION OF MESH N/A 02/27/2015   Procedure: INSERTION OF MESH;  Surgeon: Jackolyn Confer, MD;  Location: [facility];  Service: General;  Laterality: N/A;  . IR ANGIOGRAM PELVIS SELECTIVE OR SUPRASELECTIVE  12/22/2018  . IR ANGIOGRAM PELVIS SELECTIVE OR SUPRASELECTIVE  12/22/2018  . IR ANGIOGRAM SELECTIVE EACH ADDITIONAL VESSEL  12/22/2018  . IR ANGIOGRAM SELECTIVE EACH ADDITIONAL VESSEL  12/22/2018  . IR EMBO TUMOR ORGAN ISCHEMIA INFARCT INC GUIDE ROADMAPPING  12/22/2018  . IR RADIOLOGIST EVAL & MGMT  09/17/2018  . IR RADIOLOGIST EVAL & MGMT  01/05/2019  . IR RADIOLOGIST EVAL & MGMT  10/06/2019  . IR US GUIDE VASC ACCESS RIGHT  12/22/2018  . LAPAROSCOPIC ASSISTED VENTRAL HERNIA REPAIR N/A 02/27/2015   Procedure: LAPAROSCOPIC VENTRAL HERNIA REPAIR WITH MESH;  Surgeon: Jackolyn Confer, MD;  Location: [facility];  Service: General;  Laterality: N/A;  . THYROIDECTOMY, PARTIAL    . TOTAL HIP ARTHROPLASTY Right 11/28/2017   Procedure: RIGHT TOTAL HIP ARTHROPLASTY ANTERIOR APPROACH;  Surgeon: Mcarthur Rossetti, MD;  Location: [facility];  Service: Orthopedics;  Laterality: Right;  .  TOTAL HIP ARTHROPLASTY Left 07/31/2018   Procedure: LEFT TOTAL HIP ARTHROPLASTY ANTERIOR APPROACH;  Surgeon: Mcarthur Rossetti, MD;  Location: [facility];  Service: Orthopedics;  Laterality: Left;  Failed Spinal    Current Outpatient Medications  Medication Sig Dispense Refill  . amLODipine (NORVASC) 10 MG tablet Take 1 tablet (10 mg total) by mouth daily. 90 tablet 3  . diclofenac sodium (VOLTAREN) 1 % GEL Apply 2 g topically 4 (four) times daily. (Patient taking differently: Apply 2 g topically 4 (four) times daily as needed (pain). ) 100 g 3  . DULoxetine (CYMBALTA) 30 MG capsule Take 1 capsule (30 mg total) by mouth 2 (two) times daily. 180 capsule 0  . Ferrous Sulfate (IRON) 325 (65 Fe) MG TABS Take 1 tablet (325 mg total) by mouth daily. 90 tablet 0  . gabapentin  (NEURONTIN) 300 MG capsule Take 1 capsule (300 mg total) by mouth 3 (three) times daily. 90 capsule 1  . metoprolol succinate (TOPROL-XL) 50 MG 24 hr tablet Take 1 tablet (50 mg total) by mouth daily. Take with or immediately following a meal. 90 tablet 3  . naproxen sodium (ALEVE) 220 MG tablet Take 220 mg by mouth as needed (pain/headache).      No current facility-administered medications for this visit.    Family History  Problem Relation Age of Onset  . Hypertension Father   . Arthritis Father   . Clotting disorder Father   . Prostate cancer Paternal Grandfather     Review of Systems  All other systems reviewed and are negative.   Exam:   BP (!) 146/90 (Cuff Size: Large)   Pulse 76   Resp (!) 22   Ht 5' 8.5" (1.74 m)   Wt 280 lb (127 kg)   LMP 01/30/2020 (Exact Date)   BMI 41.95 kg/m     General appearance: alert, cooperative and appears stated age Head: normocephalic, without obvious abnormality, atraumatic Neck: no adenopathy, supple, symmetrical, trachea midline and thyroid normal to inspection and palpation Lungs: clear to auscultation bilaterally Breasts: normal appearance, no masses or tenderness, No nipple retraction or dimpling, No nipple discharge or bleeding, No axillary adenopathy Heart: regular rate and rhythm Abdomen: soft, non-tender; no masses, no organomegaly Extremities: extremities normal, atraumatic, no cyanosis or edema Skin: skin color, texture, turgor normal. No rashes or lesions Lymph nodes: cervical, supraclavicular, and axillary nodes normal. Neurologic: grossly normal  Pelvic: External genitalia:  no lesions              No abnormal inguinal nodes palpated.              Urethra:  normal appearing urethra with no masses, tenderness or lesions              Bartholins and Skenes: normal                 Vagina: normal appearing vagina with normal color and discharge, no lesions              Cervix: no lesions              Pap taken:  No. Bimanual Exam:  Uterus:  13 week size, mobile.               Adnexa: no mass, fullness, tenderness              Rectal exam: Yes.  .  Confirms.              Anus:  normal sphincter tone, no lesions  Chaperone was present for exam.  Assessment:   Well woman visit with normal exam. Status post uterine artery embolization.  Symptomatic fibroids. HTN. Pancreatic pseudocysts.  Ventral hernia repair with mesh.  Status post partial thyroidectomy.  Back pain.  Under evaluation.   Plan: Mammogram screening discussed. Self breast awareness reviewed. Pap and HR HPV as above. Guidelines for Calcium, Vitamin D, regular exercise program including cardiovascular and weight bearing exercise. IFOB.  Start Micronor and follow up in 3 months.  Declines Depo Provera, Elagolix, hysterectomy at this time, but will consider options.    Follow up annually and prn.   After visit summary provided.

## 2020-02-16 ENCOUNTER — Ambulatory Visit: Payer: BC Managed Care – PPO | Admitting: Obstetrics and Gynecology

## 2020-02-16 ENCOUNTER — Encounter: Payer: Self-pay | Admitting: Obstetrics and Gynecology

## 2020-02-16 ENCOUNTER — Other Ambulatory Visit: Payer: Self-pay

## 2020-02-16 VITALS — BP 146/90 | HR 76 | Resp 22 | Ht 68.5 in | Wt 280.0 lb

## 2020-02-16 DIAGNOSIS — Z1211 Encounter for screening for malignant neoplasm of colon: Secondary | ICD-10-CM | POA: Diagnosis not present

## 2020-02-16 DIAGNOSIS — Z01419 Encounter for gynecological examination (general) (routine) without abnormal findings: Secondary | ICD-10-CM | POA: Diagnosis not present

## 2020-02-16 MED ORDER — NORETHINDRONE 0.35 MG PO TABS: 1.0000 | ORAL_TABLET | Freq: Every day | ORAL | 3 refills | Status: DC

## 2020-02-16 NOTE — Patient Instructions (Signed)
EXERCISE AND DIET:  We recommended that you start or continue a regular exercise program for good health. Regular exercise means any activity that makes your heart beat faster and makes you sweat.  We recommend exercising at least 30 minutes per day at least 3 days a week, preferably 4 or 5.  We also recommend a diet low in fat and sugar.  Inactivity, poor dietary choices and obesity can cause diabetes, heart attack, stroke, and kidney damage, among others.    ALCOHOL AND SMOKING:  Women should limit their alcohol intake to no more than 7 drinks/beers/glasses of wine (combined, not each!) per week. Moderation of alcohol intake to this level decreases your risk of breast cancer and liver damage. And of course, no recreational drugs are part of a healthy lifestyle.  And absolutely no smoking or even second hand smoke. Most people know smoking can cause heart and lung diseases, but did you know it also contributes to weakening of your bones? Aging of your skin?  Yellowing of your teeth and nails?  CALCIUM AND VITAMIN D:  Adequate intake of calcium and Vitamin D are recommended.  The recommendations for exact amounts of these supplements seem to change often, but generally speaking 600 mg of calcium (either carbonate or citrate) and 800 units of Vitamin D per day seems prudent. Certain women may benefit from higher intake of Vitamin D.  If you are among these women, your doctor will have told you during your visit.    PAP SMEARS:  Pap smears, to check for cervical cancer or precancers,  have traditionally been done yearly, although recent scientific advances have shown that most women can have pap smears less often.  However, every woman still should have a physical exam from her gynecologist every year. It will include a breast check, inspection of the vulva and vagina to check for abnormal growths or skin changes, a visual exam of the cervix, and then an exam to evaluate the size and shape of the uterus and  ovaries.  And after 45 years of age, a rectal exam is indicated to check for rectal cancers. We will also provide age appropriate advice regarding health maintenance, like when you should have certain vaccines, screening for sexually transmitted diseases, bone density testing, colonoscopy, mammograms, etc.   MAMMOGRAMS:  All women over 40 years old should have a yearly mammogram. Many facilities now offer a "3D" mammogram, which may cost around $50 extra out of pocket. If possible,  we recommend you accept the option to have the 3D mammogram performed.  It both reduces the number of women who will be called back for extra views which then turn out to be normal, and it is better than the routine mammogram at detecting truly abnormal areas.    COLONOSCOPY:  Colonoscopy to screen for colon cancer is recommended for all women at age 50.  We know, you hate the idea of the prep.  We agree, BUT, having colon cancer and not knowing it is worse!!  Colon cancer so often starts as a polyp that can be seen and removed at colonscopy, which can quite literally save your life!  And if your first colonoscopy is normal and you have no family history of colon cancer, most women don't have to have it again for 10 years.  Once every ten years, you can do something that may end up saving your life, right?  We will be happy to help you get it scheduled when you are ready.    Be sure to check your insurance coverage so you understand how much it will cost.  It may be covered as a preventative service at no cost, but you should check your particular policy.       Norethindrone tablets (contraception) What is this medicine? NORETHINDRONE (nor eth IN drone) is an oral contraceptive. The product contains a female hormone known as a progestin. It is used to prevent pregnancy. This medicine may be used for other purposes; ask your health care provider or pharmacist if you have questions. COMMON BRAND NAME(S): Camila, Deblitane  28-Day, Errin, Heather, Jencycla, Jolivette, Lyza, Nor-QD, Nora-BE, Norlyroc, Ortho Micronor, Sharobel 28-Day What should I tell my health care provider before I take this medicine? They need to know if you have any of these conditions:  blood vessel disease or blood clots  breast, cervical, or vaginal cancer  diabetes  heart disease  kidney disease  liver disease  mental depression  migraine  seizures  stroke  vaginal bleeding  an unusual or allergic reaction to norethindrone, other medicines, foods, dyes, or preservatives  pregnant or trying to get pregnant  breast-feeding How should I use this medicine? Take this medicine by mouth with a glass of water. You may take it with or without food. Follow the directions on the prescription label. Take this medicine at the same time each day and in the order directed on the package. Do not take your medicine more often than directed. Contact your pediatrician regarding the use of this medicine in children. Special care may be needed. This medicine has been used in female children who have started having menstrual periods. A patient package insert for the product will be given with each prescription and refill. Read this sheet carefully each time. The sheet may change frequently. Overdosage: If you think you have taken too much of this medicine contact a poison control center or emergency room at once. NOTE: This medicine is only for you. Do not share this medicine with others. What if I miss a dose? Try not to miss a dose. Every time you miss a dose or take a dose late your chance of pregnancy increases. When 1 pill is missed (even if only 3 hours late), take the missed pill as soon as possible and continue taking a pill each day at the regular time (use a back up method of birth control for the next 48 hours). If more than 1 dose is missed, use an additional birth control method for the rest of your pill pack until menses occurs.  Contact your health care professional if more than 1 dose has been missed. What may interact with this medicine? Do not take this medicine with any of the following medications:  amprenavir or fosamprenavir  bosentan This medicine may also interact with the following medications:  antibiotics or medicines for infections, especially rifampin, rifabutin, rifapentine, and griseofulvin, and possibly penicillins or tetracyclines  aprepitant  barbiturate medicines, such as phenobarbital  carbamazepine  felbamate  modafinil  oxcarbazepine  phenytoin  ritonavir or other medicines for HIV infection or AIDS  St. John's wort  topiramate This list may not describe all possible interactions. Give your health care provider a list of all the medicines, herbs, non-prescription drugs, or dietary supplements you use. Also tell them if you smoke, drink alcohol, or use illegal drugs. Some items may interact with your medicine. What should I watch for while using this medicine? Visit your doctor or health care professional for regular checks on your   progress. You will need a regular breast and pelvic exam and Pap smear while on this medicine. Use an additional method of birth control during the first cycle that you take these tablets. If you have any reason to think you are pregnant, stop taking this medicine right away and contact your doctor or health care professional. If you are taking this medicine for hormone related problems, it may take several cycles of use to see improvement in your condition. This medicine does not protect you against HIV infection (AIDS) or any other sexually transmitted diseases. What side effects may I notice from receiving this medicine? Side effects that you should report to your doctor or health care professional as soon as possible:  breast tenderness or discharge  pain in the abdomen, chest, groin or leg  severe headache  skin rash, itching, or  hives  sudden shortness of breath  unusually weak or tired  vision or speech problems  yellowing of skin or eyes Side effects that usually do not require medical attention (report to your doctor or health care professional if they continue or are bothersome):  changes in sexual desire  change in menstrual flow  facial hair growth  fluid retention and swelling  headache  irritability  nausea  weight gain or loss This list may not describe all possible side effects. Call your doctor for medical advice about side effects. You may report side effects to FDA at 1-800-FDA-1088. Where should I keep my medicine? Keep out of the reach of children. Store at room temperature between 15 and 30 degrees C (59 and 86 degrees F). Throw away any unused medicine after the expiration date. NOTE: This sheet is a summary. It may not cover all possible information. If you have questions about this medicine, talk to your doctor, pharmacist, or health care provider.  2020 Elsevier/Gold Standard (2012-03-20 16:41:35)  

## 2020-02-27 IMAGING — DX DG PORTABLE PELVIS
1 series · 1 of 1 positions shown · non-contrast
Comparison: 11/28/2017

CLINICAL DATA: Status post right hip replacement

EXAM:
PORTABLE PELVIS 1-2 VIEWS

[pelvis ap]
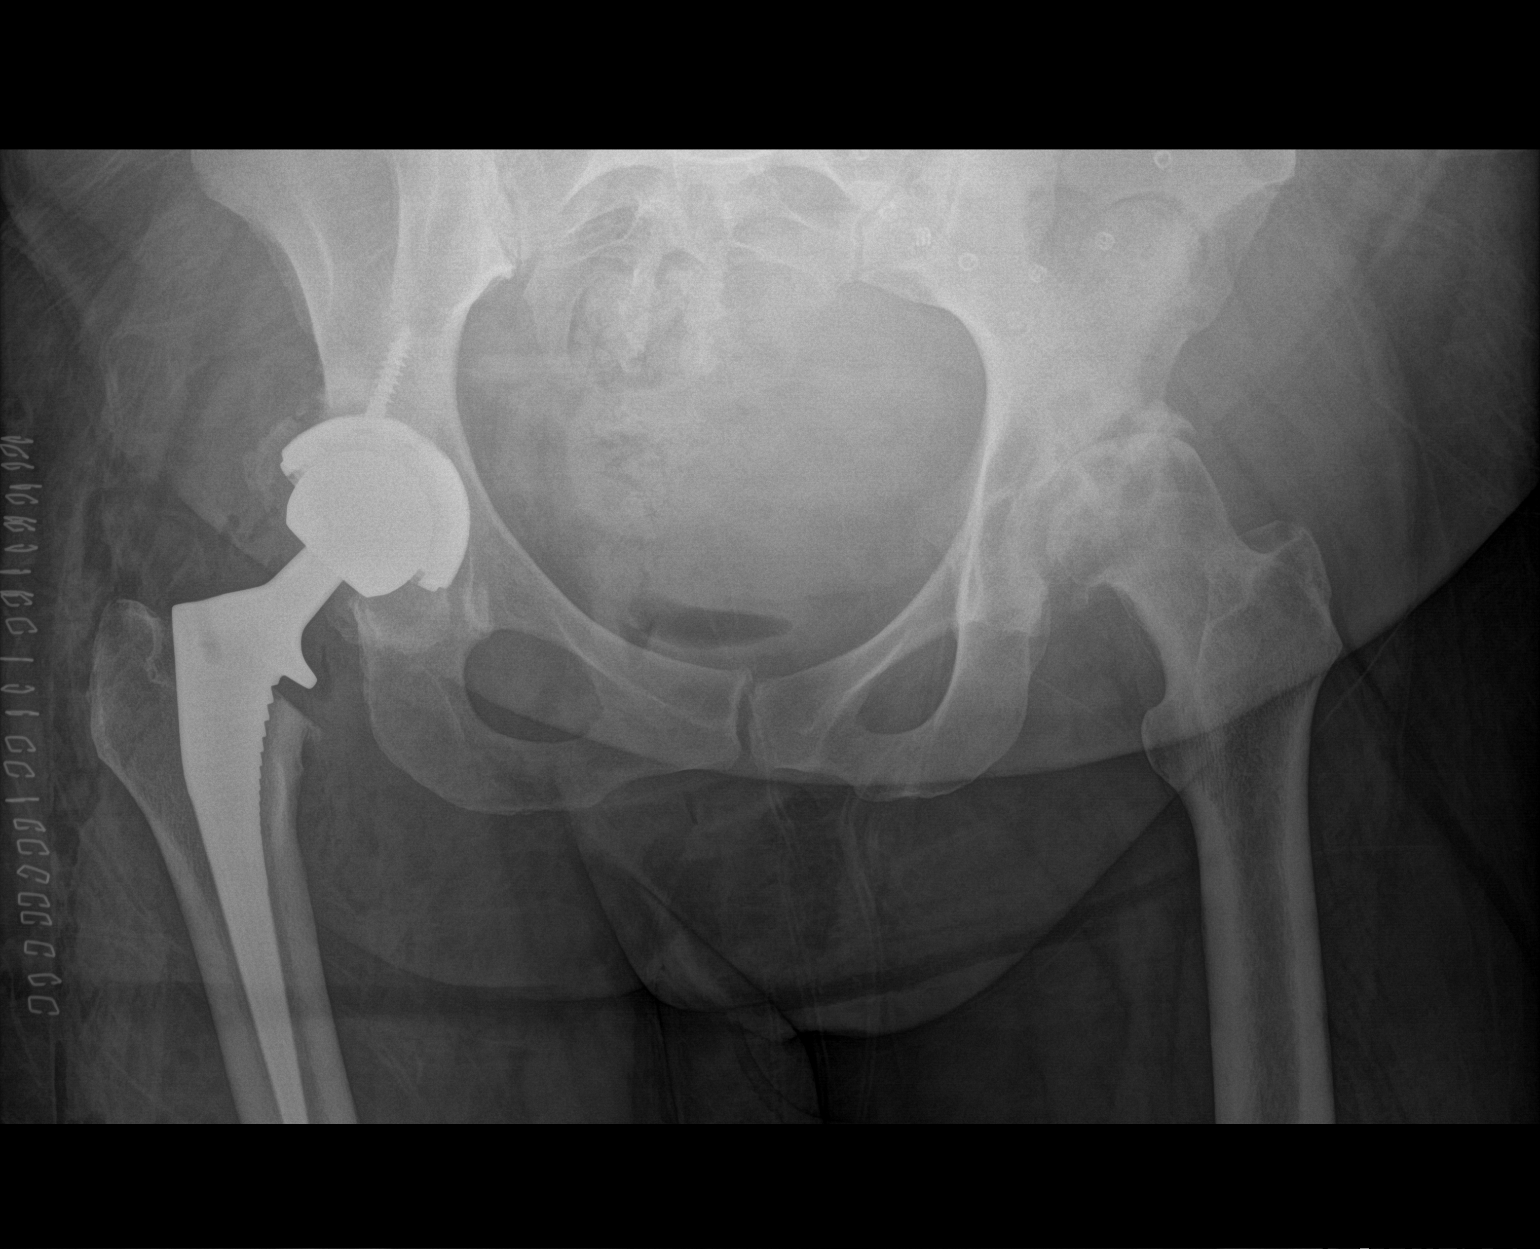

[1 of 1 positions shown; findings below may reference images not displayed]

FINDINGS: Right hip replacement is noted. No acute bony abnormality is seen.
Significant degenerative changes of the left hip joint are noted as
well.
IMPRESSION: Right hip replacement

## 2020-03-04 ENCOUNTER — Other Ambulatory Visit: Payer: Self-pay

## 2020-03-04 ENCOUNTER — Ambulatory Visit
Admission: RE | Admit: 2020-03-04 | Discharge: 2020-03-04 | Disposition: A | Discharge status: Home / Self Care | Payer: BC Managed Care – PPO | Source: Ambulatory Visit | Attending: Family Medicine | Admitting: Family Medicine

## 2020-03-04 DIAGNOSIS — M5416 Radiculopathy, lumbar region: Secondary | ICD-10-CM

## 2020-03-07 ENCOUNTER — Other Ambulatory Visit: Payer: Self-pay

## 2020-03-07 DIAGNOSIS — M5416 Radiculopathy, lumbar region: Secondary | ICD-10-CM

## 2020-03-07 DIAGNOSIS — M5417 Radiculopathy, lumbosacral region: Secondary | ICD-10-CM

## 2020-03-21 ENCOUNTER — Encounter: Payer: Self-pay | Admitting: Obstetrics and Gynecology

## 2020-03-31 ENCOUNTER — Other Ambulatory Visit: Payer: Self-pay | Admitting: Family Medicine

## 2020-03-31 DIAGNOSIS — G8929 Other chronic pain: Secondary | ICD-10-CM

## 2020-04-03 MED ORDER — DULOXETINE HCL 30 MG PO CPEP: 30.0000 mg | ORAL_CAPSULE | Freq: Two times a day (BID) | ORAL | 0 refills | Status: DC

## 2020-04-05 ENCOUNTER — Other Ambulatory Visit: Payer: Self-pay | Admitting: Family Medicine

## 2020-04-05 DIAGNOSIS — I10 Essential (primary) hypertension: Secondary | ICD-10-CM

## 2020-04-28 ENCOUNTER — Telehealth: Payer: Self-pay

## 2020-04-28 NOTE — Telephone Encounter (Signed)
Patient called in needing to talk about her referral and needing to know the next steps with getting another one. Dr she was referred basically has refused to see her and she hasn't had a physical visit with him only a phone call which happened today letting her know it was nothing he coyud do for her    Please call and advise

## 2020-05-01 ENCOUNTER — Other Ambulatory Visit: Payer: Self-pay

## 2020-05-01 NOTE — Telephone Encounter (Signed)
Patient called back into the office for the message she left last week wanting to know what can be done for her    Please advise

## 2020-05-02 NOTE — Telephone Encounter (Signed)
Spoke with pt aware that a referral to different [facility] was placed and that she will receive a call for scheduling

## 2020-05-10 ENCOUNTER — Other Ambulatory Visit: Payer: Self-pay | Admitting: Family Medicine

## 2020-05-10 DIAGNOSIS — D509 Iron deficiency anemia, unspecified: Secondary | ICD-10-CM

## 2020-05-11 ENCOUNTER — Telehealth: Payer: Self-pay

## 2020-05-11 NOTE — Telephone Encounter (Signed)
Documentation not needed.

## 2020-05-12 ENCOUNTER — Other Ambulatory Visit: Payer: Self-pay

## 2020-05-12 DIAGNOSIS — M5417 Radiculopathy, lumbosacral region: Secondary | ICD-10-CM

## 2020-05-15 NOTE — Progress Notes (Signed)
GYNECOLOGY  VISIT   HPI: 44 y.o.   Married  [place] American  female   [place] with Patient's last menstrual period was 04/20/2020.   here for 3 month medication follow up.  Seen for annual exam on 02/16/20 ad reported heavy and painful menses.  She started on Micronor at that time.  She previously had a uterine artery embolization in June 2020.   Her frequency of her period is not as regular.  Cycles:  8/18 - 8/23, 9/11- 9/16, 10/6 - 10/11, no bleeding this month.  Periods are not as painful.  Bleeds for 4 -5 days, which is shorter for her.  Flow is not as heavy.   Her pad change is every 3 hours at worst. 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  When she will start her period, she has cramping in her abdomen and back and crave sweets.  She is pleased not having pain when her cycle starts.  She would prefer not to have a menses.  She is on Gabapentin for pain.  She does not have the hip pain she previously had.  No more neuropathy symptoms.  She is going to see a Psychologist, sport and exercise.   GYNECOLOGIC HISTORY: Patient's last menstrual period was 04/20/2020. Contraception:  Abstinence/Uterine Artery Embolization/Micronor Menopausal hormone therapy:  none Last mammogram: 02-26-20 3D/Neg/density A/BiRads1 Last pap smear:  01/22/18 Neg:Neg HR HPV, 02-08-13 Neg        OB History    Gravida  1   Para  1   Term  0   Preterm  0   AB  0   Living  1     SAB  0   TAB  0   Ectopic  0   Multiple  0   Live Births  0              Patient Active Problem List   Diagnosis Date Noted  . 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 Fibroids 01/22/2018  . 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 Unilateral primary osteoarthritis, left hip 11/06/2017  . Unilateral primary osteoarthritis, right hip 11/06/2017  . Lumbosacral radiculopathy at S1 09/12/2017  . Back pain 08/19/2017  . 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  . Abnormal gait  08/19/2017  . Obesity 08/19/2017  . Ventral hernia s/p laparoscopic repair with mesh 02/27/15 02/27/2015    Past Medical History:  Diagnosis Date  . Anemia   . Arthritis   . Back pain    lower back   . Complication of anesthesia    age 44 had twilight anesthesia and woke up during surgery   . Depression   . Fatty liver 01/22/2016   Noted on MRI abd  . Fibroids   . Heavy periods   . History of gallstones   . History of thyroid nodule    Left  . Hypertension   . Irregular periods   . Obesity   . Pancreatic lesion   . Pinched nerve    in back  . Ventral hernia 12/26/2014   Midline noted on CT Abd pelvis    Past Surgical History:  Procedure Laterality Date  . benign breast cyst removed Left    age 1  . CHOLECYSTECTOMY    . INSERTION OF MESH N/A 02/27/2015   Procedure: INSERTION OF MESH;  Surgeon: Jackolyn Confer, MD;  Location: [facility];  Service: General;  Laterality: N/A;  . IR ANGIOGRAM  PELVIS SELECTIVE OR SUPRASELECTIVE  12/22/2018  . IR ANGIOGRAM PELVIS SELECTIVE OR SUPRASELECTIVE  12/22/2018  . IR ANGIOGRAM SELECTIVE EACH ADDITIONAL VESSEL  12/22/2018  . IR ANGIOGRAM SELECTIVE EACH ADDITIONAL VESSEL  12/22/2018  . IR EMBO TUMOR ORGAN ISCHEMIA INFARCT INC GUIDE ROADMAPPING  12/22/2018  . IR RADIOLOGIST EVAL & MGMT  09/17/2018  . IR RADIOLOGIST EVAL & MGMT  01/05/2019  . IR RADIOLOGIST EVAL & MGMT  10/06/2019  . IR US GUIDE VASC ACCESS RIGHT  12/22/2018  . LAPAROSCOPIC ASSISTED VENTRAL HERNIA REPAIR N/A 02/27/2015   Procedure: LAPAROSCOPIC VENTRAL HERNIA REPAIR WITH MESH;  Surgeon: Jackolyn Confer, MD;  Location: [facility];  Service: General;  Laterality: N/A;  . THYROIDECTOMY, PARTIAL    . TOTAL HIP ARTHROPLASTY Right 11/28/2017   Procedure: RIGHT TOTAL HIP ARTHROPLASTY ANTERIOR APPROACH;  Surgeon: Mcarthur Rossetti, MD;  Location: [facility];  Service: Orthopedics;  Laterality: Right;  . TOTAL HIP ARTHROPLASTY Left 07/31/2018   Procedure: LEFT TOTAL HIP ARTHROPLASTY ANTERIOR APPROACH;   Surgeon: Mcarthur Rossetti, MD;  Location: [facility];  Service: Orthopedics;  Laterality: Left;  Failed Spinal    Current Outpatient Medications  Medication Sig Dispense Refill  . amLODipine (NORVASC) 10 MG tablet Take 1 tablet (10 mg total) by mouth daily. 90 tablet 3  . diclofenac sodium (VOLTAREN) 1 % GEL Apply 2 g topically 4 (four) times daily. (Patient taking differently: Apply 2 g topically 4 (four) times daily as needed (pain). ) 100 g 3  . DULoxetine (CYMBALTA) 30 MG capsule Take 1 capsule (30 mg total) by mouth 2 (two) times daily. 180 capsule 0  . Ferrous Sulfate (IRON) 325 (65 Fe) MG TABS Take 1 tablet by mouth daily 90 tablet 0  . gabapentin (NEURONTIN) 300 MG capsule TAKE 1 CAPSULE BY MOUTH THREE TIMES DAILY 90 capsule 0  . metoprolol succinate (TOPROL-XL) 50 MG 24 hr tablet TAKE 1 TABLET BY MOUTH ONCE DAILY. TAKE WITH OR IMMEDIATELY FOLLOWING A MEAL. 90 tablet 0  . naproxen sodium (ALEVE) 220 MG tablet Take 220 mg by mouth as needed (pain/headache).     . norethindrone (MICRONOR) 0.35 MG tablet Take 1 tablet (0.35 mg total) by mouth daily. 84 tablet 3   No current facility-administered medications for this visit.     ALLERGIES: Patient has no known allergies.  Family History  Problem Relation Age of Onset  . Hypertension Father   . Arthritis Father   . Clotting disorder Father   . Prostate cancer Paternal Grandfather     Social History   Socioeconomic History  . Marital status: Married    Spouse name: Not on file  . Number of children: Not on file  . Years of education: Not on file  . Highest education level: Not on file  Occupational History  . Not on file  Tobacco Use  . Smoking status: Former Smoker    Types: Cigarettes    Quit date: 06/14/2017    Years since quitting: 2.9  . Smokeless tobacco: Never Used  Vaping Use  . Vaping Use: Never used  Substance and Sexual Activity  . Alcohol use: Yes    Alcohol/week: 2.0 - 3.0 standard drinks    Types: 2 -  3 Glasses of wine per week  . Drug use: Yes    Types: Marijuana    Comment: last time 07/08/2018  . Sexual activity: Not Currently    Birth control/protection: Abstinence  Other Topics Concern  . Not on file  Social  History Narrative  . Not on file   Social Determinants of Health   Financial Resource Strain:   . Difficulty of Paying Living Expenses: Not on file  Food Insecurity:   . Worried About Charity fundraiser in the Last Year: Not on file  . Ran Out of Food in the Last Year: Not on file  Transportation Needs:   . Lack of Transportation (Medical): Not on file  . Lack of Transportation (Non-Medical): Not on file  Physical Activity:   . Days of Exercise per Week: Not on file  . Minutes of Exercise per Session: Not on file  Stress:   . Feeling of Stress : Not on file  Social Connections:   . Frequency of Communication with Friends and Family: Not on file  . Frequency of Social Gatherings with Friends and Family: Not on file  . Attends Religious Services: Not on file  . Active Member of Clubs or Organizations: Not on file  . Attends Archivist Meetings: Not on file  . Marital Status: Not on file  Intimate Partner Violence:   . Fear of Current or Ex-Partner: Not on file  . Emotionally Abused: Not on file  . Physically Abused: Not on file  . Sexually Abused: Not on file    Review of Systems  All other systems reviewed and are negative.   PHYSICAL EXAMINATION:    BP (!) 136/92 (BP Location: Right Arm, Patient Position: Sitting, Cuff Size: Large)   Pulse 72   Resp 14   Ht 5\' 8"  (1.727 m)   Wt 292 lb 3.2 oz (132.5 kg)   LMP 04/20/2020   BMI 44.43 kg/m     General appearance: alert, cooperative and appears stated age   ASSESSMENT  Uterine fibroids.  Pelvic pain.  Status post uterine artery embolization.   PLAN  Will switch from Micronor to Aygestin 5 mg daily.   Potential side effects discussed.  FU in 3 months.    31 minutes total time was  spent for this patient encounter, including preparation, face-to-face counseling with the patient, coordination of care, and documentation of the encounter.

## 2020-05-16 ENCOUNTER — Ambulatory Visit: Payer: BC Managed Care – PPO | Admitting: Obstetrics and Gynecology

## 2020-05-16 ENCOUNTER — Other Ambulatory Visit: Payer: Self-pay

## 2020-05-16 ENCOUNTER — Encounter: Payer: Self-pay | Admitting: Obstetrics and Gynecology

## 2020-05-16 ENCOUNTER — Other Ambulatory Visit: Payer: Self-pay | Admitting: Family Medicine

## 2020-05-16 VITALS — BP 136/92 | HR 72 | Resp 14 | Ht 68.0 in | Wt 292.2 lb

## 2020-05-16 DIAGNOSIS — D219 Benign neoplasm of connective and other soft tissue, unspecified: Secondary | ICD-10-CM

## 2020-05-16 DIAGNOSIS — R102 Pelvic and perineal pain: Secondary | ICD-10-CM

## 2020-05-16 MED ORDER — NORETHINDRONE ACETATE 5 MG PO TABS: 5.0000 mg | ORAL_TABLET | Freq: Every day | ORAL | 1 refills | Status: DC

## 2020-05-30 ENCOUNTER — Encounter: Payer: Self-pay | Admitting: Orthopaedic Surgery

## 2020-05-30 ENCOUNTER — Ambulatory Visit: Payer: BC Managed Care – PPO | Admitting: Orthopaedic Surgery

## 2020-05-30 VITALS — BP 112/76 | HR 67 | Ht 68.0 in | Wt 275.0 lb

## 2020-05-30 DIAGNOSIS — M5126 Other intervertebral disc displacement, lumbar region: Secondary | ICD-10-CM | POA: Diagnosis not present

## 2020-05-30 DIAGNOSIS — M545 Low back pain, unspecified: Secondary | ICD-10-CM

## 2020-05-30 DIAGNOSIS — M5417 Radiculopathy, lumbosacral region: Secondary | ICD-10-CM

## 2020-05-30 DIAGNOSIS — G8929 Other chronic pain: Secondary | ICD-10-CM | POA: Diagnosis not present

## 2020-05-30 NOTE — Progress Notes (Signed)
Office Visit Note   Patient: Rachel Warren           Date of Birth: 05-25-1976           MRN: [id number] Visit Date: 05/30/2020              Requested by: Eulas Post, MD [place],  [place] [id number] PCP: Billie Ruddy, MD   Assessment & Plan: Visit Diagnoses:  1. Chronic right-sided low back pain, unspecified whether sciatica present   2. Lumbosacral radiculopathy at S1   3. Protrusion of lumbar intervertebral disc     Plan: Patient has radiculopathy with progression of disc protrusion with both the L5 and S1 weakness on the right.  We will set up for single epidural if this not effective she can return and discuss possible surgical intervention with microdiscectomy.  MRI scan was reviewed I gave her a copy of the report we compared to previous MRI from few years ago.  Patient does have some facet arthropathy at other levels but has good disc hydration and good disc space height at all other levels except for L5-S1.  Follow-Up Instructions: No follow-ups on file.   Orders:  Orders Placed This Encounter  Procedures  . Ambulatory referral to Physical Medicine Rehab   No orders of the defined types were placed in this encounter.     Procedures: No procedures performed   Clinical Data: No additional findings.   Subjective: Chief Complaint  Patient presents with  . Lower Back - Pain    HPI 44 year old female new patient with complaints of chronic back pain.  States she has had hip replacements by Dr.Blackman.  She has times when she is barely able to get out of bed to make it to the bathroom.  She ambulates with a cane which she uses due to her back.  She is taken some gabapentin with some relief use Voltaren gel.  States both hips are doing well.  Pain radiates down to her feet she states it is a throbbing numbing type pain.  When she has menstrual cycle feels like her pain seems to be worse.  Pain now radiates down her right leg she  denies problems with the left side. Patient is an MRI scan which showed progressive spondylosis at L5-S1 with new disc protrusion with cephalad extension with right lateral recess compression and compression of the right S1 nerve root.  There has been progression in comparison to previous 04/13/2017 images.  Patient's had at least 4 epidurals in the past done at [facility].  None in more than a year.  Patient has bowel bladder symptoms, no chills or fever. Review of Systems Possible bilateral total hip arthroplasties no history of AVN.  Positive for obesity fibroids and L5-S1 disc protrusion right.  All other systems are negative.  Objective: Vital Signs: BP 112/76   Pulse 67   Ht 5\' 8"  (1.727 m)   Wt 275 lb (124.7 kg)   BMI 41.81 kg/m   Physical Exam Constitutional:      Appearance: She is well-developed.  HENT:     Head: Normocephalic.     Right Ear: External ear normal.     Left Ear: External ear normal.  Eyes:     Pupils: Pupils are equal, round, and reactive to light.  Neck:     Thyroid: No thyromegaly.     Trachea: No tracheal deviation.  Cardiovascular:     Rate and Rhythm: Normal rate.  Pulmonary:     Effort: Pulmonary effort is normal.  Abdominal:     Palpations: Abdomen is soft.  Skin:    General: Skin is warm and dry.  Neurological:     Mental Status: She is alert and oriented to person, place, and time.  Psychiatric:        Behavior: Behavior normal.     Ortho Exam patient straight leg raising on the right side at 60 degrees.  She sits in a chair sitting on her left buttocks keeping her right off the chair and rotated for relief.  Right leg pain with attempts at heel walking.  Positive popliteal compression test on the right negative on the left.  Patient has fatiguing with toe walking on the right normal on the left.  She is unable to do single stance toe raises on the right.  Patient has some anterior tib weakness on the right one half grade 1/2 grade EHL weakness no  toe extension weakness 2 through 5.  Opposite left has normal anterior tib EHL and toe extension.    Specialty Comments:  No specialty comments available.  Imaging: CLINICAL DATA:  Intermittent low back pain with right leg pain, weakness and numbness for 2 years.  EXAM: MRI LUMBAR SPINE WITHOUT CONTRAST  TECHNIQUE: Multiplanar, multisequence MR imaging of the lumbar spine was performed. No intravenous contrast was administered.  COMPARISON:  MRI lumbar spine 04/13/2017.  FINDINGS: Segmentation:  Standard.  Alignment:  Normal.  Vertebrae:  No fracture, evidence of discitis, or bone lesion.  Conus medullaris and cauda equina: Conus extends to the L1-2 level. Conus and cauda equina appear normal.  Paraspinal and other soft tissues: Fibroid uterus is partially imaged. Otherwise negative.  Disc levels:  T11-12 is imaged in the sagittal plane only and negative.  T12-L1: Mild facet degenerative change.  Otherwise negative.  L1-2: Mild facet arthropathy.  Otherwise negative.  L2-3: Mild facet arthropathy.  Otherwise negative.  L3-4: Mild facet arthropathy and ligamentum flavum thickening. No stenosis.  L4-5: Mild-to-moderate facet degenerative disease with associated small effusions. No disc bulge or protrusion. No stenosis.  L5-S1: Right subarticular recess protrusion is again seen. There is new cephalad extension of the disc causing impingement on the right L5 root. Impingement on the right S1 root does not appear markedly changed. The left foramen is widely patent.  IMPRESSION: Progression of spondylosis at L5-S1 where there is new cephalad extension of a right subarticular recess protrusion resulting in impingement on the right L5 root. Impingement on the descending right S1 root is identified as seen on the prior MRI.  Scattered facet arthropathy most notable at L4-5 where there are associated small effusions.   Electronically Signed    By: Inge Rise M.D.   On: 03/04/2020 16:58    PMFS History: Patient Active Problem List   Diagnosis Date Noted  . Protrusion of lumbar intervertebral disc 05/31/2020  . 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 Fibroids 01/22/2018  . 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 Unilateral primary osteoarthritis, left hip 11/06/2017  . Unilateral primary osteoarthritis, right hip 11/06/2017  . Lumbosacral radiculopathy at S1 09/12/2017  . Back pain 08/19/2017  . 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  . Abnormal gait 08/19/2017  . Obesity 08/19/2017  . Ventral hernia s/p laparoscopic repair with mesh 02/27/15 02/27/2015   Past Medical History:  Diagnosis Date  . Anemia   . Arthritis   . Back pain    lower back   .  Complication of anesthesia    age 56 had twilight anesthesia and woke up during surgery   . Depression   . Fatty liver 01/22/2016   Noted on MRI abd  . Fibroids   . Heavy periods   . History of gallstones   . History of thyroid nodule    Left  . Hypertension   . Irregular periods   . Obesity   . Pancreatic lesion   . Pinched nerve    in back  . Ventral hernia 12/26/2014   Midline noted on CT Abd pelvis    Family History  Problem Relation Age of Onset  . Hypertension Father   . Arthritis Father   . Clotting disorder Father   . Prostate cancer Paternal Grandfather     Past Surgical History:  Procedure Laterality Date  . benign breast cyst removed Left    age 41  . CHOLECYSTECTOMY    . INSERTION OF MESH N/A 02/27/2015   Procedure: INSERTION OF MESH;  Surgeon: Jackolyn Confer, MD;  Location: [facility];  Service: General;  Laterality: N/A;  . IR ANGIOGRAM PELVIS SELECTIVE OR SUPRASELECTIVE  12/22/2018  . IR ANGIOGRAM PELVIS SELECTIVE OR SUPRASELECTIVE  12/22/2018  . IR ANGIOGRAM SELECTIVE EACH ADDITIONAL VESSEL  12/22/2018  . IR ANGIOGRAM SELECTIVE EACH ADDITIONAL VESSEL  12/22/2018  . IR EMBO TUMOR ORGAN ISCHEMIA INFARCT INC GUIDE  ROADMAPPING  12/22/2018  . IR RADIOLOGIST EVAL & MGMT  09/17/2018  . IR RADIOLOGIST EVAL & MGMT  01/05/2019  . IR RADIOLOGIST EVAL & MGMT  10/06/2019  . IR US GUIDE VASC ACCESS RIGHT  12/22/2018  . LAPAROSCOPIC ASSISTED VENTRAL HERNIA REPAIR N/A 02/27/2015   Procedure: LAPAROSCOPIC VENTRAL HERNIA REPAIR WITH MESH;  Surgeon: Jackolyn Confer, MD;  Location: [facility];  Service: General;  Laterality: N/A;  . THYROIDECTOMY, PARTIAL    . TOTAL HIP ARTHROPLASTY Right 11/28/2017   Procedure: RIGHT TOTAL HIP ARTHROPLASTY ANTERIOR APPROACH;  Surgeon: Mcarthur Rossetti, MD;  Location: [facility];  Service: Orthopedics;  Laterality: Right;  . TOTAL HIP ARTHROPLASTY Left 07/31/2018   Procedure: LEFT TOTAL HIP ARTHROPLASTY ANTERIOR APPROACH;  Surgeon: Mcarthur Rossetti, MD;  Location: [facility];  Service: Orthopedics;  Laterality: Left;  Failed Spinal   Social History   Occupational History  . Not on file  Tobacco Use  . Smoking status: Former Smoker    Types: Cigarettes    Quit date: 06/14/2017    Years since quitting: 2.9  . Smokeless tobacco: Never Used  Vaping Use  . Vaping Use: Never used  Substance and Sexual Activity  . Alcohol use: Yes    Alcohol/week: 2.0 - 3.0 standard drinks    Types: 2 - 3 Glasses of wine per week  . Drug use: Yes    Types: Marijuana    Comment: last time 07/08/2018  . Sexual activity: Not Currently    Birth control/protection: Abstinence

## 2020-05-31 DIAGNOSIS — M5126 Other intervertebral disc displacement, lumbar region: Secondary | ICD-10-CM | POA: Insufficient documentation

## 2020-06-17 ENCOUNTER — Other Ambulatory Visit: Payer: Self-pay | Admitting: Family Medicine

## 2020-06-17 DIAGNOSIS — I10 Essential (primary) hypertension: Secondary | ICD-10-CM

## 2020-06-27 ENCOUNTER — Ambulatory Visit (INDEPENDENT_AMBULATORY_CARE_PROVIDER_SITE_OTHER): Payer: BC Managed Care – PPO | Admitting: Orthopaedic Surgery

## 2020-06-27 ENCOUNTER — Other Ambulatory Visit: Payer: Self-pay

## 2020-06-27 VITALS — BP 154/97 | HR 68 | Ht 68.0 in | Wt 275.0 lb

## 2020-06-27 DIAGNOSIS — M5126 Other intervertebral disc displacement, lumbar region: Secondary | ICD-10-CM

## 2020-06-27 NOTE — Progress Notes (Signed)
Office Visit Note   Patient: Rachel Warren           Date of Birth: 1975/12/02           MRN: [id number] Visit Date: 06/27/2020              Requested by: Billie Ruddy, MD [place],  [place] [id number] PCP: Billie Ruddy, MD   Assessment & Plan: Visit Diagnoses:  1. Protrusion of lumbar intervertebral disc     Plan: Reviewed MRI discussed options if the epidurals effective then she should be able to avoid any surgical procedure.  She states she has had surgery on her hips and would like to avoid any more surgery if possible which we discussed were certainly in agreement about this.  Recheck in February.  43-month handicap parking sticker given.  Follow-Up Instructions: No follow-ups on file.   Orders:  No orders of the defined types were placed in this encounter.  No orders of the defined types were placed in this encounter.     Procedures: No procedures performed   Clinical Data: No additional findings.   Subjective: Chief Complaint  Patient presents with  . Lower Back - Follow-up    HPI 44 year old female returns with ongoing problems with back pain and originally more left leg pain now right leg pain also.  She is requesting a temporary handicap sticker and states she is not scheduled for an epidural until 08/01/2020 due to some transportation issues. Today's visit we reviewed her previous MRI scan discussed pathophysiology once again. Review of Systems  14 point system update unchanged.  Previous right and left total of arthroplasties doing well. Objective: Vital Signs: BP (!) 154/97   Pulse 68   Ht 5\' 8"  (1.727 m)   Wt 275 lb (124.7 kg)   BMI 41.81 kg/m   Physical Exam Constitutional:      Appearance: She is well-developed.  HENT:     Head: Normocephalic.     Right Ear: External ear normal.     Left Ear: External ear normal.  Eyes:     Pupils: Pupils are equal, round, and reactive to light.  Neck:     Thyroid: No  thyromegaly.     Trachea: No tracheal deviation.  Cardiovascular:     Rate and Rhythm: Normal rate.  Pulmonary:     Effort: Pulmonary effort is normal.  Abdominal:     Palpations: Abdomen is soft.  Skin:    General: Skin is warm and dry.  Neurological:     Mental Status: She is alert and oriented to person, place, and time.  Psychiatric:        Mood and Affect: Mood and affect normal.        Behavior: Behavior normal.     Ortho Exam negative logroll of the hips she can ambulate toe walk and heel walk with slight balance problems but no isolated motor weakness anterior tib is strong and symmetrical.  EHL on the right shows one half grade weakness.  No toe extension weakness.  Specialty Comments:  No specialty comments available.  Imaging: CLINICAL DATA:  Intermittent low back pain with right leg pain, weakness and numbness for 2 years.  EXAM: MRI LUMBAR SPINE WITHOUT CONTRAST  TECHNIQUE: Multiplanar, multisequence MR imaging of the lumbar spine was performed. No intravenous contrast was administered.  COMPARISON:  MRI lumbar spine 04/13/2017.  FINDINGS: Segmentation:  Standard.  Alignment:  Normal.  Vertebrae:  No fracture,  evidence of discitis, or bone lesion.  Conus medullaris and cauda equina: Conus extends to the L1-2 level. Conus and cauda equina appear normal.  Paraspinal and other soft tissues: Fibroid uterus is partially imaged. Otherwise negative.  Disc levels:  T11-12 is imaged in the sagittal plane only and negative.  T12-L1: Mild facet degenerative change.  Otherwise negative.  L1-2: Mild facet arthropathy.  Otherwise negative.  L2-3: Mild facet arthropathy.  Otherwise negative.  L3-4: Mild facet arthropathy and ligamentum flavum thickening. No stenosis.  L4-5: Mild-to-moderate facet degenerative disease with associated small effusions. No disc bulge or protrusion. No stenosis.  L5-S1: Right subarticular recess protrusion is  again seen. There is new cephalad extension of the disc causing impingement on the right L5 root. Impingement on the right S1 root does not appear markedly changed. The left foramen is widely patent.  IMPRESSION: Progression of spondylosis at L5-S1 where there is new cephalad extension of a right subarticular recess protrusion resulting in impingement on the right L5 root. Impingement on the descending right S1 root is identified as seen on the prior MRI.  Scattered facet arthropathy most notable at L4-5 where there are associated small effusions.   Electronically Signed   By: Inge Rise M.D.   On: 03/04/2020 16:58   PMFS History: Patient Active Problem List   Diagnosis Date Noted  . Protrusion of lumbar intervertebral disc 05/31/2020  . 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 Fibroids 01/22/2018  . 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 Unilateral primary osteoarthritis, left hip 11/06/2017  . Unilateral primary osteoarthritis, right hip 11/06/2017  . Lumbosacral radiculopathy at S1 09/12/2017  . Back pain 08/19/2017  . 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  . Abnormal gait 08/19/2017  . Obesity 08/19/2017  . Ventral hernia s/p laparoscopic repair with mesh 02/27/15 02/27/2015   Past Medical History:  Diagnosis Date  . Anemia   . Arthritis   . Back pain    lower back   . Complication of anesthesia    age 60 had twilight anesthesia and woke up during surgery   . Depression   . Fatty liver 01/22/2016   Noted on MRI abd  . Fibroids   . Heavy periods   . History of gallstones   . History of thyroid nodule    Left  . Hypertension   . Irregular periods   . Obesity   . Pancreatic lesion   . Pinched nerve    in back  . Ventral hernia 12/26/2014   Midline noted on CT Abd pelvis    Family History  Problem Relation Age of Onset  . Hypertension Father   . Arthritis Father   . Clotting disorder Father   . Prostate cancer  Paternal Grandfather     Past Surgical History:  Procedure Laterality Date  . benign breast cyst removed Left    age 67  . CHOLECYSTECTOMY    . INSERTION OF MESH N/A 02/27/2015   Procedure: INSERTION OF MESH;  Surgeon: Jackolyn Confer, MD;  Location: [facility];  Service: General;  Laterality: N/A;  . IR ANGIOGRAM PELVIS SELECTIVE OR SUPRASELECTIVE  12/22/2018  . IR ANGIOGRAM PELVIS SELECTIVE OR SUPRASELECTIVE  12/22/2018  . IR ANGIOGRAM SELECTIVE EACH ADDITIONAL VESSEL  12/22/2018  . IR ANGIOGRAM SELECTIVE EACH ADDITIONAL VESSEL  12/22/2018  . IR EMBO TUMOR ORGAN ISCHEMIA INFARCT INC GUIDE ROADMAPPING  12/22/2018  . IR RADIOLOGIST EVAL & MGMT  09/17/2018  . IR RADIOLOGIST EVAL &  MGMT  01/05/2019  . IR RADIOLOGIST EVAL & MGMT  10/06/2019  . IR US GUIDE VASC ACCESS RIGHT  12/22/2018  . LAPAROSCOPIC ASSISTED VENTRAL HERNIA REPAIR N/A 02/27/2015   Procedure: LAPAROSCOPIC VENTRAL HERNIA REPAIR WITH MESH;  Surgeon: Jackolyn Confer, MD;  Location: [facility];  Service: General;  Laterality: N/A;  . THYROIDECTOMY, PARTIAL    . TOTAL HIP ARTHROPLASTY Right 11/28/2017   Procedure: RIGHT TOTAL HIP ARTHROPLASTY ANTERIOR APPROACH;  Surgeon: Mcarthur Rossetti, MD;  Location: [facility];  Service: Orthopedics;  Laterality: Right;  . TOTAL HIP ARTHROPLASTY Left 07/31/2018   Procedure: LEFT TOTAL HIP ARTHROPLASTY ANTERIOR APPROACH;  Surgeon: Mcarthur Rossetti, MD;  Location: [facility];  Service: Orthopedics;  Laterality: Left;  Failed Spinal   Social History   Occupational History  . Not on file  Tobacco Use  . Smoking status: Former Smoker    Types: Cigarettes    Quit date: 06/14/2017    Years since quitting: 3.0  . Smokeless tobacco: Never Used  Vaping Use  . Vaping Use: Never used  Substance and Sexual Activity  . Alcohol use: Yes    Alcohol/week: 2.0 - 3.0 standard drinks    Types: 2 - 3 Glasses of wine per week  . Drug use: Yes    Types: Marijuana    Comment: last time 07/08/2018  . Sexual activity: Not  Currently    Birth control/protection: Abstinence

## 2020-07-05 ENCOUNTER — Other Ambulatory Visit: Payer: Self-pay | Admitting: Family Medicine

## 2020-07-05 DIAGNOSIS — I10 Essential (primary) hypertension: Secondary | ICD-10-CM

## 2020-07-09 ENCOUNTER — Other Ambulatory Visit: Payer: Self-pay | Admitting: Family Medicine

## 2020-07-09 DIAGNOSIS — I10 Essential (primary) hypertension: Secondary | ICD-10-CM

## 2020-07-10 MED ORDER — METOPROLOL SUCCINATE ER 50 MG PO TB24: 50.0000 mg | ORAL_TABLET | Freq: Every day | ORAL | 0 refills | Status: DC

## 2020-07-16 ENCOUNTER — Other Ambulatory Visit: Payer: Self-pay | Admitting: Family Medicine

## 2020-07-19 ENCOUNTER — Other Ambulatory Visit: Payer: Self-pay | Admitting: Family Medicine

## 2020-07-25 ENCOUNTER — Ambulatory Visit: Payer: BC Managed Care – PPO | Admitting: Obstetrics and Gynecology

## 2020-08-01 ENCOUNTER — Ambulatory Visit: Payer: Self-pay

## 2020-08-01 ENCOUNTER — Other Ambulatory Visit: Payer: Self-pay

## 2020-08-01 ENCOUNTER — Ambulatory Visit: Payer: BC Managed Care – PPO | Admitting: Physical Medicine and Rehabilitation

## 2020-08-01 ENCOUNTER — Encounter: Payer: Self-pay | Admitting: Physical Medicine and Rehabilitation

## 2020-08-01 VITALS — BP 137/93 | HR 89

## 2020-08-01 DIAGNOSIS — M5416 Radiculopathy, lumbar region: Secondary | ICD-10-CM

## 2020-08-01 MED ORDER — BETAMETHASONE SOD PHOS & ACET 6 (3-3) MG/ML IJ SUSP
12.0000 mg | Freq: Once | INTRAMUSCULAR | Status: AC
  Administered 2020-08-01: 12 mg

## 2020-08-01 NOTE — Patient Instructions (Signed)

## 2020-08-01 NOTE — Progress Notes (Signed)
Pt state Lower back pain that travels down both legs. Pt state standing for a long period of time makes the pain worse. Pt state when standing while cooking about the time she finish the has gotten really bad. Pt state she take pain meds and use heating pads to help ease the pain.  Numeric Pain Rating Scale and Functional Assessment Average Pain 6   In the last MONTH (on 0-10 scale) has pain interfered with the following?  1. General activity like being  able to carry out your everyday physical activities such as walking, climbing stairs, carrying groceries, or moving a chair?  Rating(10)   +Driver, -BT, -Dye Allergies.

## 2020-08-02 NOTE — Progress Notes (Signed)
Rachel Warren - 45 y.o. female MRN [id number]  Date of birth: April 16, 1976  Office Visit Note: Visit Date: 08/01/2020 PCP: Billie Ruddy, MD Referred by: Billie Ruddy, MD  Subjective: Chief Complaint  Patient presents with  . Lower Back - Pain  . Left Leg - Pain  . Right Leg - Pain   HPI:  Rachel Warren is a 45 y.o. female who comes in today at the request of Dr. Rodell Perna for planned Right L5-S1 Lumbar epidural steroid injection with fluoroscopic guidance.  The patient has failed conservative care including home exercise, medications, time and activity modification.  This injection will be diagnostic and hopefully therapeutic.  Please see requesting physician notes for further details and justification.  MRI reviewed with images and spine model.  MRI reviewed in the note below.    ROS Otherwise per HPI.  Assessment & Plan: Visit Diagnoses:    ICD-10-CM   1. Lumbar radiculopathy  M54.16 XR C-ARM NO REPORT    Epidural Steroid injection    betamethasone acetate-betamethasone sodium phosphate (CELESTONE) injection 12 mg    Plan: No additional findings.   Meds & Orders:  Meds ordered this encounter  Medications  . betamethasone acetate-betamethasone sodium phosphate (CELESTONE) injection 12 mg    Orders Placed This Encounter  Procedures  . XR C-ARM NO REPORT  . Epidural Steroid injection    Follow-up: Return for Rodell Perna, MD.   Procedures: No procedures performed  Lumbar Epidural Steroid Injection - Interlaminar Approach with Fluoroscopic Guidance  Patient: Rachel Warren      Date of Birth: Feb 20, 1976 MRN: [id number] PCP: Billie Ruddy, MD      Visit Date: 08/01/2020   Universal Protocol:     Consent Given By: the patient  Position: PRONE  Additional Comments: Vital signs were monitored before and after the procedure. Patient was prepped and draped in the usual sterile fashion. The correct patient, procedure, and site was  verified.   Injection Procedure Details:   Procedure diagnoses: Lumbar radiculopathy [M54.16]   Meds Administered:  Meds ordered this encounter  Medications  . betamethasone acetate-betamethasone sodium phosphate (CELESTONE) injection 12 mg     Laterality: Right  Location/Site:  L5-S1  Needle: 4.5 in., 20 ga. Tuohy  Needle Placement: Paramedian epidural  Findings:   -Comments: Excellent flow of contrast into the epidural space.  Procedure Details: Using a paramedian approach from the side mentioned above, the region overlying the inferior lamina was localized under fluoroscopic visualization and the soft tissues overlying this structure were infiltrated with 4 ml. of 1% Lidocaine without Epinephrine. The Tuohy needle was inserted into the epidural space using a paramedian approach.   The epidural space was localized using loss of resistance along with counter oblique bi-planar fluoroscopic views.  After negative aspirate for air, blood, and CSF, a 2 ml. volume of Isovue-250 was injected into the epidural space and the flow of contrast was observed. Radiographs were obtained for documentation purposes.    The injectate was administered into the level noted above.   Additional Comments:  The patient tolerated the procedure well Dressing: 2 x 2 sterile gauze and Band-Aid    Post-procedure details: Patient was observed during the procedure. Post-procedure instructions were reviewed.  Patient left the clinic in stable condition.     Clinical History: MRI LUMBAR SPINE WITHOUT CONTRAST  TECHNIQUE: Multiplanar, multisequence MR imaging of the lumbar spine was performed. No intravenous contrast was administered.  COMPARISON:  MRI lumbar spine  04/13/2017.  FINDINGS: Segmentation:  Standard.  Alignment:  Normal.  Vertebrae:  No fracture, evidence of discitis, or bone lesion.  Conus medullaris and cauda equina: Conus extends to the L1-2 level. Conus and cauda  equina appear normal.  Paraspinal and other soft tissues: Fibroid uterus is partially imaged. Otherwise negative.  Disc levels:  T11-12 is imaged in the sagittal plane only and negative.  T12-L1: Mild facet degenerative change.  Otherwise negative.  L1-2: Mild facet arthropathy.  Otherwise negative.  L2-3: Mild facet arthropathy.  Otherwise negative.  L3-4: Mild facet arthropathy and ligamentum flavum thickening. No stenosis.  L4-5: Mild-to-moderate facet degenerative disease with associated small effusions. No disc bulge or protrusion. No stenosis.  L5-S1: Right subarticular recess protrusion is again seen. There is new cephalad extension of the disc causing impingement on the right L5 root. Impingement on the right S1 root does not appear markedly changed. The left foramen is widely patent.  IMPRESSION: Progression of spondylosis at L5-S1 where there is new cephalad extension of a right subarticular recess protrusion resulting in impingement on the right L5 root. Impingement on the descending right S1 root is identified as seen on the prior MRI.  Scattered facet arthropathy most notable at L4-5 where there are associated small effusions.   Electronically Signed   By: Inge Rise M.D.   On: 03/04/2020 16:58     Objective:  VS:  HT:    WT:   BMI:     BP:(!) 137/93  HR:89bpm  TEMP: ( )  RESP:  Physical Exam Vitals and nursing note reviewed.  Constitutional:      General: She is not in acute distress.    Appearance: Normal appearance. She is obese. She is not ill-appearing.  HENT:     Head: Normocephalic and atraumatic.     Right Ear: External ear normal.     Left Ear: External ear normal.  Eyes:     Extraocular Movements: Extraocular movements intact.  Cardiovascular:     Rate and Rhythm: Normal rate.     Pulses: Normal pulses.  Pulmonary:     Effort: Pulmonary effort is normal. No respiratory distress.  Abdominal:     General: There  is no distension.     Palpations: Abdomen is soft.  Musculoskeletal:        General: Tenderness present.     Cervical back: Neck supple.     Right lower leg: No edema.     Left lower leg: No edema.     Comments: Patient has good distal strength with no pain over the greater trochanters.  No clonus or focal weakness.  Skin:    Findings: No erythema, lesion or rash.  Neurological:     General: No focal deficit present.     Mental Status: She is alert and oriented to person, place, and time.     Sensory: No sensory deficit.     Motor: No weakness or abnormal muscle tone.     Coordination: Coordination normal.  Psychiatric:        Mood and Affect: Mood normal.        Behavior: Behavior normal.      Imaging: XR C-ARM NO REPORT  Result Date: 08/01/2020 Please see Notes tab for imaging impression.

## 2020-08-02 NOTE — Procedures (Signed)
Lumbar Epidural Steroid Injection - Interlaminar Approach with Fluoroscopic Guidance  Patient: Rachel Warren      Date of Birth: [birth year] MRN: [id number] PCP: Billie Ruddy, MD      Visit Date: 08/01/2020   Universal Protocol:     Consent Given By: the patient  Position: PRONE  Additional Comments: Vital signs were monitored before and after the procedure. Patient was prepped and draped in the usual sterile fashion. The correct patient, procedure, and site was verified.   Injection Procedure Details:   Procedure diagnoses: Lumbar radiculopathy [M54.16]   Meds Administered:  Meds ordered this encounter  Medications  . betamethasone acetate-betamethasone sodium phosphate (CELESTONE) injection 12 mg     Laterality: Right  Location/Site:  L5-S1  Needle: 4.5 in., 20 ga. Tuohy  Needle Placement: Paramedian epidural  Findings:   -Comments: Excellent flow of contrast into the epidural space.  Procedure Details: Using a paramedian approach from the side mentioned above, the region overlying the inferior lamina was localized under fluoroscopic visualization and the soft tissues overlying this structure were infiltrated with 4 ml. of 1% Lidocaine without Epinephrine. The Tuohy needle was inserted into the epidural space using a paramedian approach.   The epidural space was localized using loss of resistance along with counter oblique bi-planar fluoroscopic views.  After negative aspirate for air, blood, and CSF, a 2 ml. volume of Isovue-250 was injected into the epidural space and the flow of contrast was observed. Radiographs were obtained for documentation purposes.    The injectate was administered into the level noted above.   Additional Comments:  The patient tolerated the procedure well Dressing: 2 x 2 sterile gauze and Band-Aid    Post-procedure details: Patient was observed during the procedure. Post-procedure instructions were reviewed.  Patient  left the clinic in stable condition.

## 2020-08-15 ENCOUNTER — Other Ambulatory Visit: Payer: Self-pay

## 2020-08-15 DIAGNOSIS — D509 Iron deficiency anemia, unspecified: Secondary | ICD-10-CM

## 2020-08-15 MED ORDER — IRON 325 (65 FE) MG PO TABS: 1.0000 | ORAL_TABLET | Freq: Every day | ORAL | 0 refills | Status: DC

## 2020-08-18 ENCOUNTER — Other Ambulatory Visit: Payer: Self-pay | Admitting: Family Medicine

## 2020-08-18 DIAGNOSIS — G8929 Other chronic pain: Secondary | ICD-10-CM

## 2020-08-20 ENCOUNTER — Other Ambulatory Visit: Payer: Self-pay | Admitting: Family Medicine

## 2020-08-20 DIAGNOSIS — G8929 Other chronic pain: Secondary | ICD-10-CM

## 2020-08-21 NOTE — Telephone Encounter (Signed)
Pt LOV was on 02/07/2020 and last refill was done on 9/20/20221 for 180 tablets, ok to send refills

## 2020-08-22 ENCOUNTER — Ambulatory Visit: Payer: BC Managed Care – PPO | Admitting: Obstetrics and Gynecology

## 2020-08-22 NOTE — Progress Notes (Deleted)
GYNECOLOGY  VISIT   HPI: 45 y.o.   Married  [place] American  female   [place] with No LMP recorded.   here for 3 month medication follow up.  GYNECOLOGIC HISTORY: No LMP recorded. Contraception: Abstinence/Uterine Artery Embolization/Aygestin 5mg  daily Menopausal hormone therapy:  none Last mammogram: 02-26-20 3D/Neg/density A/BiRads1 Last pap smear: 01/22/18 Neg:Neg HR HPV, 02-08-13 Neg        OB History    Gravida  1   Para  1   Term  0   Preterm  0   AB  0   Living  1     SAB  0   IAB  0   Ectopic  0   Multiple  0   Live Births  0              Patient Active Problem List   Diagnosis Date Noted  . Protrusion of lumbar intervertebral disc 05/31/2020  . 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 Fibroids 01/22/2018  . 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 Unilateral primary osteoarthritis, left hip 11/06/2017  . Unilateral primary osteoarthritis, right hip 11/06/2017  . Lumbosacral radiculopathy at S1 09/12/2017  . Back pain 08/19/2017  . 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  . Abnormal gait 08/19/2017  . Obesity 08/19/2017  . Ventral hernia s/p laparoscopic repair with mesh 02/27/15 02/27/2015    Past Medical History:  Diagnosis Date  . Anemia   . Arthritis   . Back pain    lower back   . Complication of anesthesia    age 58 had twilight anesthesia and woke up during surgery   . Depression   . Fatty liver 01/22/2016   Noted on MRI abd  . Fibroids   . Heavy periods   . History of gallstones   . History of thyroid nodule    Left  . Hypertension   . Irregular periods   . Obesity   . Pancreatic lesion   . Pinched nerve    in back  . Ventral hernia 12/26/2014   Midline noted on CT Abd pelvis    Past Surgical History:  Procedure Laterality Date  . benign breast cyst removed Left    age 2  . CHOLECYSTECTOMY    . INSERTION OF MESH N/A 02/27/2015   Procedure: INSERTION OF MESH;  Surgeon: Jackolyn Confer, MD;   Location: [facility];  Service: General;  Laterality: N/A;  . IR ANGIOGRAM PELVIS SELECTIVE OR SUPRASELECTIVE  12/22/2018  . IR ANGIOGRAM PELVIS SELECTIVE OR SUPRASELECTIVE  12/22/2018  . IR ANGIOGRAM SELECTIVE EACH ADDITIONAL VESSEL  12/22/2018  . IR ANGIOGRAM SELECTIVE EACH ADDITIONAL VESSEL  12/22/2018  . IR EMBO TUMOR ORGAN ISCHEMIA INFARCT INC GUIDE ROADMAPPING  12/22/2018  . IR RADIOLOGIST EVAL & MGMT  09/17/2018  . IR RADIOLOGIST EVAL & MGMT  01/05/2019  . IR RADIOLOGIST EVAL & MGMT  10/06/2019  . IR US GUIDE VASC ACCESS RIGHT  12/22/2018  . LAPAROSCOPIC ASSISTED VENTRAL HERNIA REPAIR N/A 02/27/2015   Procedure: LAPAROSCOPIC VENTRAL HERNIA REPAIR WITH MESH;  Surgeon: Jackolyn Confer, MD;  Location: [facility];  Service: General;  Laterality: N/A;  . THYROIDECTOMY, PARTIAL    . TOTAL HIP ARTHROPLASTY Right 11/28/2017   Procedure: RIGHT TOTAL HIP ARTHROPLASTY ANTERIOR APPROACH;  Surgeon: Mcarthur Rossetti, MD;  Location: [facility];  Service: Orthopedics;  Laterality: Right;  . TOTAL HIP ARTHROPLASTY Left 07/31/2018   Procedure: LEFT TOTAL HIP ARTHROPLASTY ANTERIOR APPROACH;  Surgeon:  Mcarthur Rossetti, MD;  Location: [facility];  Service: Orthopedics;  Laterality: Left;  Failed Spinal    Current Outpatient Medications  Medication Sig Dispense Refill  . amLODipine (NORVASC) 10 MG tablet Take 1 tablet by mouth once daily 90 tablet 0  . diclofenac sodium (VOLTAREN) 1 % GEL Apply 2 g topically 4 (four) times daily. (Patient taking differently: Apply 2 g topically 4 (four) times daily as needed (pain).) 100 g 3  . DULoxetine (CYMBALTA) 30 MG capsule Take 1 capsule by mouth twice daily 180 capsule 0  . Ferrous Sulfate (IRON) 325 (65 Fe) MG TABS Take 1 tablet (325 mg total) by mouth daily. 90 tablet 0  . gabapentin (NEURONTIN) 300 MG capsule TAKE 1 CAPSULE BY MOUTH THREE TIMES DAILY 90 capsule 0  . metoprolol succinate (TOPROL-XL) 50 MG 24 hr tablet Take 1 tablet (50 mg total) by mouth daily. Take with or  immediately following a meal. 90 tablet 0  . naproxen sodium (ALEVE) 220 MG tablet Take 220 mg by mouth as needed (pain/headache).     . norethindrone (AYGESTIN) 5 MG tablet Take 1 tablet (5 mg total) by mouth daily. 90 tablet 1   No current facility-administered medications for this visit.     ALLERGIES: Patient has no known allergies.  Family History  Problem Relation Age of Onset  . Hypertension Father   . Arthritis Father   . Clotting disorder Father   . Prostate cancer Paternal Grandfather     Social History   Socioeconomic History  . Marital status: Married    Spouse name: Not on file  . Number of children: Not on file  . Years of education: Not on file  . Highest education level: Not on file  Occupational History  . Not on file  Tobacco Use  . Smoking status: Former Smoker    Types: Cigarettes    Quit date: 06/14/2017    Years since quitting: 3.1  . Smokeless tobacco: Never Used  Vaping Use  . Vaping Use: Never used  Substance and Sexual Activity  . Alcohol use: Yes    Alcohol/week: 2.0 - 3.0 standard drinks    Types: 2 - 3 Glasses of wine per week  . Drug use: Yes    Types: Marijuana    Comment: last time 07/08/2018  . Sexual activity: Not Currently    Birth control/protection: Abstinence  Other Topics Concern  . Not on file  Social History Narrative  . Not on file   Social Determinants of Health   Financial Resource Strain: Not on file  Food Insecurity: Not on file  Transportation Needs: Not on file  Physical Activity: Not on file  Stress: Not on file  Social Connections: Not on file  Intimate Partner Violence: Not on file    Review of Systems  PHYSICAL EXAMINATION:    There were no vitals taken for this visit.    General appearance: alert, cooperative and appears stated age Head: Normocephalic, without obvious abnormality, atraumatic Neck: no adenopathy, supple, symmetrical, trachea midline and thyroid normal to inspection and  palpation Lungs: clear to auscultation bilaterally Breasts: normal appearance, no masses or tenderness, No nipple retraction or dimpling, No nipple discharge or bleeding, No axillary or supraclavicular adenopathy Heart: regular rate and rhythm Abdomen: soft, non-tender, no masses,  no organomegaly Extremities: extremities normal, atraumatic, no cyanosis or edema Skin: Skin color, texture, turgor normal. No rashes or lesions Lymph nodes: Cervical, supraclavicular, and axillary nodes normal. No abnormal inguinal nodes  palpated Neurologic: Grossly normal  Pelvic: External genitalia:  no lesions              Urethra:  normal appearing urethra with no masses, tenderness or lesions              Bartholins and Skenes: normal                 Vagina: normal appearing vagina with normal color and discharge, no lesions              Cervix: no lesions                Bimanual Exam:  Uterus:  normal size, contour, position, consistency, mobility, non-tender              Adnexa: no mass, fullness, tenderness              Rectal exam: {yes no:[id number]}.  Confirms.              Anus:  normal sphincter tone, no lesions  Chaperone was present for exam.  ASSESSMENT     PLAN     An After Visit Summary was printed and given to the patient.  ______ minutes face to face time of which over 50% was spent in counseling.

## 2020-09-04 ENCOUNTER — Encounter: Payer: BC Managed Care – PPO | Admitting: Family Medicine

## 2020-09-08 ENCOUNTER — Other Ambulatory Visit: Payer: Self-pay | Admitting: Family Medicine

## 2020-09-08 DIAGNOSIS — I10 Essential (primary) hypertension: Secondary | ICD-10-CM

## 2020-09-08 MED ORDER — AMLODIPINE BESYLATE 10 MG PO TABS: 10.0000 mg | ORAL_TABLET | Freq: Every day | ORAL | 0 refills | Status: DC

## 2020-09-08 NOTE — Telephone Encounter (Signed)
Pt LOV was 02/07/2020 and refill done same day for 90 tablets, ok to refill

## 2020-09-12 ENCOUNTER — Other Ambulatory Visit: Payer: Self-pay | Admitting: Family Medicine

## 2020-09-13 ENCOUNTER — Other Ambulatory Visit: Payer: Self-pay | Admitting: Family Medicine

## 2020-09-13 MED ORDER — GABAPENTIN 300 MG PO CAPS: 300.0000 mg | ORAL_CAPSULE | Freq: Three times a day (TID) | ORAL | 0 refills | Status: DC

## 2020-09-17 MED ORDER — GABAPENTIN 300 MG PO CAPS: 300.0000 mg | ORAL_CAPSULE | Freq: Three times a day (TID) | ORAL | 0 refills | Status: DC

## 2020-09-20 ENCOUNTER — Ambulatory Visit (INDEPENDENT_AMBULATORY_CARE_PROVIDER_SITE_OTHER): Payer: BC Managed Care – PPO

## 2020-09-20 ENCOUNTER — Ambulatory Visit: Payer: BC Managed Care – PPO | Admitting: Family Medicine

## 2020-09-20 ENCOUNTER — Encounter: Payer: Self-pay | Admitting: Family Medicine

## 2020-09-20 ENCOUNTER — Other Ambulatory Visit: Payer: Self-pay

## 2020-09-20 ENCOUNTER — Other Ambulatory Visit: Payer: Self-pay | Admitting: Family Medicine

## 2020-09-20 VITALS — BP 138/88 | HR 82 | Temp 98.7°F | Ht 69.0 in | Wt 295.0 lb

## 2020-09-20 DIAGNOSIS — Z0000 Encounter for general adult medical examination without abnormal findings: Secondary | ICD-10-CM | POA: Diagnosis not present

## 2020-09-20 DIAGNOSIS — M25562 Pain in left knee: Secondary | ICD-10-CM

## 2020-09-20 DIAGNOSIS — R635 Abnormal weight gain: Secondary | ICD-10-CM

## 2020-09-20 DIAGNOSIS — I10 Essential (primary) hypertension: Secondary | ICD-10-CM

## 2020-09-20 DIAGNOSIS — D219 Benign neoplasm of connective and other soft tissue, unspecified: Secondary | ICD-10-CM | POA: Diagnosis not present

## 2020-09-20 DIAGNOSIS — E781 Pure hyperglyceridemia: Secondary | ICD-10-CM | POA: Diagnosis not present

## 2020-09-20 DIAGNOSIS — D509 Iron deficiency anemia, unspecified: Secondary | ICD-10-CM

## 2020-09-20 DIAGNOSIS — M5416 Radiculopathy, lumbar region: Secondary | ICD-10-CM | POA: Diagnosis not present

## 2020-09-20 LAB — CBC WITH DIFFERENTIAL/PLATELET
Basophils Absolute: 0 10*3/uL (ref 0.0–0.1)
Basophils Relative: 0.4 % (ref 0.0–3.0)
Eosinophils Absolute: 0 10*3/uL (ref 0.0–0.7)
Eosinophils Relative: 0 % (ref 0.0–5.0)
HCT: 47.1 % — ABNORMAL HIGH (ref 36.0–46.0)
Hemoglobin: 16.1 g/dL — ABNORMAL HIGH (ref 12.0–15.0)
Lymphocytes Relative: 23.4 % (ref 12.0–46.0)
Lymphs Abs: 1.9 10*3/uL (ref 0.7–4.0)
MCHC: 34.1 g/dL (ref 30.0–36.0)
MCV: 92.6 fl (ref 78.0–100.0)
Monocytes Absolute: 0.4 10*3/uL (ref 0.1–1.0)
Monocytes Relative: 5.3 % (ref 3.0–12.0)
Neutro Abs: 5.7 10*3/uL (ref 1.4–7.7)
Neutrophils Relative %: 70.9 % (ref 43.0–77.0)
Platelets: 217 10*3/uL (ref 150.0–400.0)
RBC: 5.08 Mil/uL (ref 3.87–5.11)
RDW: 13.9 % (ref 11.5–15.5)
WBC: 8 10*3/uL (ref 4.0–10.5)

## 2020-09-20 LAB — T4, FREE: Free T4: 0.78 ng/dL (ref 0.60–1.60)

## 2020-09-20 LAB — BASIC METABOLIC PANEL
BUN: 8 mg/dL (ref 6–23)
CO2: 30 mEq/L (ref 19–32)
Calcium: 8.6 mg/dL (ref 8.4–10.5)
Chloride: 103 mEq/L (ref 96–112)
Creatinine, Ser: 0.78 mg/dL (ref 0.40–1.20)
GFR: 91.36 mL/min (ref >60.00)
Glucose, Bld: 90 mg/dL (ref 70–99)
Potassium: 3.2 mEq/L — ABNORMAL LOW (ref 3.5–5.1)
Sodium: 141 mEq/L (ref 135–145)

## 2020-09-20 LAB — VITAMIN D 25 HYDROXY (VIT D DEFICIENCY, FRACTURES): VITD: 15.79 ng/mL — ABNORMAL LOW (ref 30.00–100.00)

## 2020-09-20 LAB — HEMOGLOBIN A1C: Hgb A1c MFr Bld: 5.3 % (ref 4.6–6.5)

## 2020-09-20 LAB — TSH: TSH: 1.63 u[IU]/mL (ref 0.35–4.50)

## 2020-09-20 LAB — LIPID PANEL
Cholesterol: 170 mg/dL (ref 0–200)
HDL: 38.1 mg/dL — ABNORMAL LOW (ref >39.00)
LDL Cholesterol: 105 mg/dL — ABNORMAL HIGH (ref 0–99)
NonHDL: 131.64
Total CHOL/HDL Ratio: 4
Triglycerides: 131 mg/dL (ref 0.0–149.0)
VLDL: 26.2 mg/dL (ref 0.0–40.0)

## 2020-09-20 MED ORDER — METOPROLOL SUCCINATE ER 50 MG PO TB24: 50.0000 mg | ORAL_TABLET | Freq: Every day | ORAL | 3 refills | Status: DC

## 2020-09-20 NOTE — Patient Instructions (Signed)
Preventive Care 84-46 Years Old, Female Preventive care refers to lifestyle choices and visits with your health care provider that can promote health and wellness. This includes:  A yearly physical exam. This is also called an annual wellness visit.  Regular dental and eye exams.  Immunizations.  Screening for certain conditions.  Healthy lifestyle choices, such as: ? Eating a healthy diet. ? Getting regular exercise. ? Not using drugs or products that contain nicotine and tobacco. ? Limiting alcohol use. What can I expect for my preventive care visit? Physical exam Your health care provider will check your:  Height and weight. These may be used to calculate your BMI (body mass index). BMI is a measurement that tells if you are at a healthy weight.  Heart rate and blood pressure.  Body temperature.  Skin for abnormal spots. Counseling Your health care provider may ask you questions about your:  Past medical problems.  Family's medical history.  Alcohol, tobacco, and drug use.  Emotional well-being.  Home life and relationship well-being.  Sexual activity.  Diet, exercise, and sleep habits.  Work and work Statistician.  Access to firearms.  Method of birth control.  Menstrual cycle.  Pregnancy history. What immunizations do I need? Vaccines are usually given at various ages, according to a schedule. Your health care provider will recommend vaccines for you based on your age, medical history, and lifestyle or other factors, such as travel or where you work.   What tests do I need? Blood tests  Lipid and cholesterol levels. These may be checked every 5 years, or more often if you are over 3 years old.  Hepatitis C test.  Hepatitis B test. Screening  Lung cancer screening. You may have this screening every year starting at age 73 if you have a 30-pack-year history of smoking and currently smoke or have quit within the past 15 years.  Colorectal cancer  screening. ? All adults should have this screening starting at age 52 and continuing until age 17. ? Your health care provider may recommend screening at age 49 if you are at increased risk. ? You will have tests every 1-10 years, depending on your results and the type of screening test.  Diabetes screening. ? This is done by checking your blood sugar (glucose) after you have not eaten for a while (fasting). ? You may have this done every 1-3 years.  Mammogram. ? This may be done every 1-2 years. ? Talk with your health care provider about when you should start having regular mammograms. This may depend on whether you have a family history of breast cancer.  BRCA-related cancer screening. This may be done if you have a family history of breast, ovarian, tubal, or peritoneal cancers.  Pelvic exam and Pap test. ? This may be done every 3 years starting at age 10. ? Starting at age 11, this may be done every 5 years if you have a Pap test in combination with an HPV test. Other tests  STD (sexually transmitted disease) testing, if you are at risk.  Bone density scan. This is done to screen for osteoporosis. You may have this scan if you are at high risk for osteoporosis. Talk with your health care provider about your test results, treatment options, and if necessary, the need for more tests. Follow these instructions at home: Eating and drinking  Eat a diet that includes fresh fruits and vegetables, whole grains, lean protein, and low-fat dairy products.  Take vitamin and mineral supplements  as recommended by your health care provider.  Do not drink alcohol if: ? Your health care provider tells you not to drink. ? You are pregnant, may be pregnant, or are planning to become pregnant.  If you drink alcohol: ? Limit how much you have to 0-1 drink a day. ? Be aware of how much alcohol is in your drink. In the [place] one drink equals one 12 oz bottle of beer (355 mL), one 5 oz glass of  wine (148 mL), or one 1 oz glass of hard liquor (44 mL).   Lifestyle  Take daily care of your teeth and gums. Brush your teeth every morning and night with fluoride toothpaste. Floss one time each day.  Stay active. Exercise for at least 30 minutes 5 or more days each week.  Do not use any products that contain nicotine or tobacco, such as cigarettes, e-cigarettes, and chewing tobacco. If you need help quitting, ask your health care provider.  Do not use drugs.  If you are sexually active, practice safe sex. Use a condom or other form of protection to prevent STIs (sexually transmitted infections).  If you do not wish to become pregnant, use a form of birth control. If you plan to become pregnant, see your health care provider for a prepregnancy visit.  If told by your health care provider, take low-dose aspirin daily starting at age 104.  Find healthy ways to cope with stress, such as: ? Meditation, yoga, or listening to music. ? Journaling. ? Talking to a trusted person. ? Spending time with friends and family. Safety  Always wear your seat belt while driving or riding in a vehicle.  Do not drive: ? If you have been drinking alcohol. Do not ride with someone who has been drinking. ? When you are tired or distracted. ? While texting.  Wear a helmet and other protective equipment during sports activities.  If you have firearms in your house, make sure you follow all gun safety procedures. What's next?  Visit your health care provider once a year for an annual wellness visit.  Ask your health care provider how often you should have your eyes and teeth checked.  Stay up to date on all vaccines. This information is not intended to replace advice given to you by your health care provider. Make sure you discuss any questions you have with your health care provider. Document Revised: 04/04/2020 Document Reviewed: 03/12/2018 Elsevier Patient Education  2021 [employer]  Radicular Pain Radicular pain is a type of pain that spreads from your back or neck along a spinal nerve. Spinal nerves are nerves that leave the spinal cord and go to the muscles. Radicular pain is sometimes called radiculopathy, radiculitis, or a pinched nerve. When you have this type of pain, you may also have weakness, numbness, or tingling in the area of your body that is supplied by the nerve. The pain may feel sharp and burning. Depending on which spinal nerve is affected, the pain may occur in the:  Neck area (cervical radicular pain). You may also feel pain, numbness, weakness, or tingling in the arms.  Mid-spine area (thoracic radicular pain). You would feel this pain in the back and chest. This type is rare.  Lower back area (lumbar radicular pain). You would feel this pain as low back pain. You may feel pain, numbness, weakness, or tingling in the buttocks or legs. Sciatica is a type of lumbar radicular pain that shoots down the back of  the leg. Radicular pain occurs when one of the spinal nerves becomes irritated or squeezed (compressed). It is often caused by something pushing on a spinal nerve, such as one of the bones of the spine (vertebrae) or one of the round cushions between vertebrae (intervertebral disks). This can result from:  An injury.  Wear and tear or aging of a disk.  The growth of a bone spur that pushes on the nerve. Radicular pain often goes away when you follow instructions from your health care provider for relieving pain at home. Follow these instructions at home: Managing pain  If directed, put ice on the affected area: ? Put ice in a plastic bag. ? Place a towel between your skin and the bag. ? Leave the ice on for 20 minutes, 2-3 times a day.  If directed, apply heat to the affected area as often as told by your health care provider. Use the heat source that your health care provider recommends, such as a moist heat pack or a heating pad. ? Place  a towel between your skin and the heat source. ? Leave the heat on for 20-30 minutes. ? Remove the heat if your skin turns bright red. This is especially important if you are unable to feel pain, heat, or cold. You may have a greater risk of getting burned.      Activity  Do not sit or rest in bed for long periods of time.  Try to stay as active as possible. Ask your health care provider what type of exercise or activity is best for you.  Avoid activities that make your pain worse, such as bending and lifting.  Do not lift anything that is heavier than 10 lb (4.5 kg), or the limit that you are told, until your health care provider says that it is safe.  Practice using proper technique when lifting items. Proper lifting technique involves bending your knees and rising up.  Do strength and range-of-motion exercises only as told by your health care provider or physical therapist.   General instructions  Take over-the-counter and prescription medicines only as told by your health care provider.  Pay attention to any changes in your symptoms.  Keep all follow-up visits as told by your health care provider. This is important. ? Your health care provider may send you to a physical therapist to help with this pain. Contact a health care provider if:  Your pain and other symptoms get worse.  Your pain medicine is not helping.  Your pain has not improved after a few weeks of home care.  You have a fever. Get help right away if:  You have severe pain, weakness, or numbness.  You have difficulty with bladder or bowel control. Summary  Radicular pain is a type of pain that spreads from your back or neck along a spinal nerve.  When you have radicular pain, you may also have weakness, numbness, or tingling in the area of your body that is supplied by the nerve.  The pain may feel sharp or burning.  Radicular pain may be treated with ice, heat, medicines, or physical therapy. This  information is not intended to replace advice given to you by your health care provider. Make sure you discuss any questions you have with your health care provider. Document Revised: 01/13/2018 Document Reviewed: 01/13/2018 Elsevier Patient Education  2021 [place].  Arthritis Arthritis is a term that is commonly used to refer to joint pain or joint disease. There are  more than 100 types of arthritis. What are the causes? The most common cause of this condition is wear and tear of a joint. Other causes include:  Gout.  Inflammation of a joint.  An infection of a joint.  Sprains and other injuries near the joint.  A reaction to medicines or drugs, or an allergic reaction. In some cases, the cause may not be known. What are the signs or symptoms? The main symptom of this condition is pain in the joint during movement. Other symptoms include:  Redness, swelling, or stiffness at a joint.  Warmth coming from the joint.  Fever.  Overall feeling of illness. How is this diagnosed? This condition may be diagnosed with a physical exam and tests, including:  Blood tests.  Urine tests.  Imaging tests, such as X-rays, an MRI, or a CT scan. Sometimes, fluid is removed from a joint for testing. How is this treated? This condition may be treated with:  Treatment of the cause, if it is known.  Rest.  Raising (elevating) the joint.  Applying cold or hot packs to the joint.  Medicines to improve symptoms and reduce inflammation.  Injections of a steroid such as cortisone into the joint to help reduce pain and inflammation. Depending on the cause of your arthritis, you may need to make lifestyle changes to reduce stress on your joint. Changes may include:  Exercising more.  Losing weight. Follow these instructions at home: Medicines  Take over-the-counter and prescription medicines only as told by your health care provider.  Do not take aspirin to relieve pain if your  health care provider thinks that gout may be causing your pain. Activity  Rest your joint if told by your health care provider. Rest is important when your disease is active and your joint feels painful, swollen, or stiff.  Avoid activities that make the pain worse. It is important to balance activity with rest.  Exercise your joint regularly with range-of-motion exercises as told by your health care provider. Try doing low-impact exercise, such as: ? Swimming. ? Water aerobics. ? Biking. ? Walking. Managing pain, stiffness, and swelling  If directed, put ice on the joint. ? Put ice in a plastic bag. ? Place a towel between your skin and the bag. ? Leave the ice on for 20 minutes, 2-3 times per day.  If your joint is swollen, raise (elevate) it above the level of your heart if directed by your health care provider.  If your joint feels stiff in the morning, try taking a warm shower.  If directed, apply heat to the affected area as often as told by your health care provider. Use the heat source that your health care provider recommends, such as a moist heat pack or a heating pad. If you have diabetes, do not apply heat without permission from your health care provider. To apply heat: ? Place a towel between your skin and the heat source. ? Leave the heat on for 20-30 minutes. ? Remove the heat if your skin turns bright red. This is especially important if you are unable to feel pain, heat, or cold. You may have a greater risk of getting burned.      General instructions  Do not use any products that contain nicotine or tobacco, such as cigarettes, e-cigarettes, and chewing tobacco. If you need help quitting, ask your health care provider.  Keep all follow-up visits as told by your health care provider. This is important. Contact a health care provider  if:  The pain gets worse.  You have a fever. Get help right away if:  You develop severe joint pain, swelling, or  redness.  Many joints become painful and swollen.  You develop severe back pain.  You develop severe weakness in your leg.  You cannot control your bladder or bowels. Summary  Arthritis is a term that is commonly used to refer to joint pain or joint disease. There are more than 100 types of arthritis.  The most common cause of this condition is wear and tear of a joint. Other causes include gout, inflammation or infection of the joint, sprains, or allergies.  Symptoms of this condition include redness, swelling, or stiffness of the joint. Other symptoms include warmth, fever, or feeling ill.  This condition is treated with rest, elevation, medicines, and applying cold or hot packs.  Follow your health care provider's instructions about medicines, activity, exercises, and other home care treatments. This information is not intended to replace advice given to you by your health care provider. Make sure you discuss any questions you have with your health care provider. Document Revised: 06/08/2018 Document Reviewed: 06/08/2018 Elsevier Patient Education  2021 [place].  Uterine Fibroids  Uterine fibroids, also called leiomyomas, are noncancerous (benign) tumors that can grow in the uterus. They can cause heavy menstrual bleeding and pain. Fibroids may also grow in the fallopian tubes, cervix, or tissues (ligaments) near the uterus. You may have one or many fibroids. Fibroids vary in size, weight, and where they grow in the uterus. Some can become quite large. Most fibroids do not require medical treatment. What are the causes? The cause of this condition is not known. What increases the risk? You are more likely to develop this condition if you:  Are in your 30s or 40s and have not gone through menopause.  Have a family history of this condition.  Are of African American descent.  Started your menstrual period at age 75 or younger.  Have never given birth.  Are overweight  or obese. What are the signs or symptoms? Many women do not have any symptoms. Symptoms of this condition may include:  Heavy menstrual bleeding.  Bleeding between menstrual periods.  Pain and pressure in the pelvic area, between your hip bones.  Pain during sex.  Bladder problems, such as needing to urinate right away or more often than usual.  Inability to have children (infertility).  Failure to carry pregnancy to term (miscarriage). How is this diagnosed? This condition may be diagnosed based on:  Your symptoms and medical history.  A physical exam.  A pelvic exam that includes feeling for any tumors.  Imaging tests, such as ultrasound or MRI. How is this treated? Treatment for this condition may include follow-up visits with your health care provider to monitor your fibroids for any changes. Other treatment may include:  Medicines, such as: ? Medicines to relieve pain, including aspirin and NSAIDs, such as ibuprofen or naproxen. ? Hormone therapy. Treatment may be given as a pill or an injection, or it may be inserted into the uterus using an intrauterine device (IUD).  Surgery that would do one of the following: ? Remove the fibroids (myomectomy). This may be recommended if fibroids affect your fertility and you want to become pregnant. ? Remove the uterus (hysterectomy). ? Block the blood supply to the fibroids (uterine artery embolization). This can cause them to shrink and die. Follow these instructions at home: Medicines  Take over-the-counter and prescription medicines only  as told by your health care provider.  Ask your health care provider if you should take iron pills or eat more iron-rich foods, such as dark green, leafy vegetables. Heavy menstrual bleeding can cause low iron levels. Managing pain If directed, apply heat to your back or abdomen to reduce pain. Use the heat source that your health care provider recommends, such as a moist heat pack or a  heating pad. To apply heat:  Place a towel between your skin and the heat source.  Leave the heat on for 20-30 minutes.  Remove the heat if your skin turns bright red. This is especially important if you are unable to feel pain, heat, or cold. You may have a greater risk of getting burned.   General instructions  Pay close attention to your menstrual cycle. Tell your health care provider about any changes, such as: ? Heavier bleeding that requires you to change your pads or tampons more than usual. ? A change in the number of days that your menstrual period lasts. ? A change in symptoms that come with your menstrual period, such as back pain or cramps in your abdomen.  Keep all follow-up visits. This is important, especially if your fibroids need to be monitored for any changes. Contact a health care provider if you:  Have pelvic pain, back pain, or cramps in your abdomen that do not get better with medicine or heat.  Develop new bleeding between menstrual periods.  Have increased bleeding during or between menstrual periods.  Feel more tired or weak than usual.  Feel light-headed. Get help right away if you:  Faint.  Have pelvic pain that suddenly gets worse.  Have severe vaginal bleeding that soaks a tampon or pad in 30 minutes or less. Summary  Uterine fibroids are noncancerous (benign) tumors that can develop in the uterus.  The exact cause of this condition is not known.  Most fibroids do not require medical treatment unless they affect your ability to have children (fertility).  Contact a health care provider if you have pelvic pain, back pain, or cramps in your abdomen that do not get better with medicines.  Get help right away if you faint, have pelvic pain that suddenly gets worse, or have severe vaginal bleeding. This information is not intended to replace advice given to you by your health care provider. Make sure you discuss any questions you have with your  health care provider. Document Revised: 02/01/2020 Document Reviewed: 02/01/2020 Elsevier Patient Education  [place].

## 2020-09-20 NOTE — Progress Notes (Signed)
Subjective:     Rachel Warren is a 45 y.o. female and is here for a comprehensive physical exam. Pt notes improvement in LE pain with gabapentin 300 mg 3 times daily.  Had an epidural injection in back 1/18 with Ortho.  Now that legs are no longer hurting notices back pain more.  Pain in back described as stiff, sore, achy sensation.  Also having left knee pain, typically at the end of the day.  May also swell.  Pain is in the center of her left knee.  Patient continues to follow-up with OB/GYN for history of fibroids and heavy menses.  Currently on Aygestin.  Endorses weight gain since being on medication.  No longer having bleeding but can tell when menses should be occurring as has increased pelvic pain and leg pain.  NSAIDs, Tylenol, or heat do not help with cramping and back pain during menses.  Patient interested in hysterectomy.  Patient is a Pharmacist, hospital.  Is trying to wait until the summer to follow-up on menstrual pain.  Social History   Socioeconomic History  . Marital status: Legally Separated    Spouse name: Not on file  . Number of children: Not on file  . Years of education: Not on file  . Highest education level: Not on file  Occupational History  . Not on file  Tobacco Use  . Smoking status: Former Smoker    Types: Cigarettes    Quit date: 06/14/2017    Years since quitting: 3.2  . Smokeless tobacco: Never Used  Vaping Use  . Vaping Use: Never used  Substance and Sexual Activity  . Alcohol use: Yes    Alcohol/week: 2.0 - 3.0 standard drinks    Types: 2 - 3 Glasses of wine per week  . Drug use: Yes    Types: Marijuana    Comment: last time 07/08/2018  . Sexual activity: Not Currently    Birth control/protection: Abstinence  Other Topics Concern  . Not on file  Social History Narrative  . Not on file   Social Determinants of Health   Financial Resource Strain: Not on file  Food Insecurity: Not on file  Transportation Needs: Not on file  Physical Activity:  Not on file  Stress: Not on file  Social Connections: Not on file  Intimate Partner Violence: Not on file   Health Maintenance  Topic Date Due  . COLONOSCOPY (Pts 45-25yrs Insurance coverage will need to be confirmed)  Never done  . COVID-19 Vaccine (3 - Booster for Pfizer series) 04/15/2020  . PAP SMEAR-Modifier  01/22/2021  . TETANUS/TDAP  01/15/2028  . INFLUENZA VACCINE  Completed  . Hepatitis C Screening  Completed  . HIV Screening  Completed  . HPV VACCINES  Aged Out    The following portions of the patient's history were reviewed and updated as appropriate: allergies, current medications, past family history, past medical history, past social history, past surgical history and problem list.  Review of Systems Pertinent items noted in HPI and remainder of comprehensive ROS otherwise negative.   Objective:    BP 138/88 (BP Location: Left Arm, Patient Position: Sitting, Cuff Size: Large)   Pulse 82   Temp 98.7 F (37.1 C) (Oral)   Ht 5\' 9"  (1.753 m)   Wt 295 lb (133.8 kg)   SpO2 97%   BMI 43.56 kg/m  General appearance: alert, cooperative and no distress Head: Normocephalic, without obvious abnormality, atraumatic Eyes: conjunctivae/corneas clear. PERRL, EOM's intact. Fundi benign. Ears: normal  TM's and external ear canals both ears Nose: Nares normal. Septum midline. Mucosa normal. No drainage or sinus tenderness. Throat: lips, mucosa, and tongue normal; teeth and gums normal Neck: no adenopathy, no carotid bruit, no JVD, supple, symmetrical, trachea midline and thyroid not enlarged, symmetric, no tenderness/mass/nodules Lungs: clear to auscultation bilaterally Heart: regular rate and rhythm, S1, S2 normal, no murmur, click, rub or gallop Abdomen: soft, non-tender; bowel sounds normal; no masses,  no organomegaly Extremities: extremities normal, atraumatic, no cyanosis or edema and b/l knees without edema, TTP, or crepitus. Pulses: 2+ and symmetric Skin: Skin color,  texture, turgor normal. No rashes or lesions Lymph nodes: Cervical, supraclavicular, and axillary nodes normal. Neurologic: Alert and oriented X 3, normal strength and tone. Normal symmetric reflexes. Normal coordination and gait    Assessment:    Healthy female exam.      Plan:     Anticipatory guidance given including wearing seatbelts, smoke detectors in the home, increasing physical activity, increasing p.o. intake of water and vegetables. -will obtain labs -offered immunizations -mammogram done 02/26/20 -followed by OB/Gyn for Pap -given handout -next CPE in 1 yr See After Visit Summary for Counseling Recommendations    Left knee pain, unspecified chronicity -Including arthritis -We will obtain imaging -Discussed supportive care including topical analgesics weight loss -Continue follow-up with Ortho - Plan: DG Knee Complete 4 Views Left, CBC with Differential/Platelet  Weight gain  -Possibly 2/2 hormones from birth control. -We will obtain labs - Plan: TSH, T4, Free, Hemoglobin A1c  Lumbar radiculopathy  -continue f/u with Ortho - Plan: Vitamin B12, Vitamin D, 25-hydroxy  Fibroids  -continue aygestin -pt interested in hysterectomy.  Advised to discuss with OB/Gyn vs obtain a 2nd opinion -continue f/u with OB/Gyn.   - Plan: CBC with Differential/Platelet  Pure hypertriglyceridemia  -lifestyle modifications - Plan: Lipid panel  Essential hypertension  -elevated -likely 2/2 pain -continue metoprolol succinate 50 mg -Patient encouraged to check BP at home and keep a log to clinic - Plan: metoprolol succinate (TOPROL-XL) 50 MG 24 hr tablet  F/u prn  Grier Mitts, MD

## 2020-09-22 ENCOUNTER — Other Ambulatory Visit: Payer: Self-pay | Admitting: Family Medicine

## 2020-09-22 DIAGNOSIS — E876 Hypokalemia: Secondary | ICD-10-CM

## 2020-09-22 DIAGNOSIS — E559 Vitamin D deficiency, unspecified: Secondary | ICD-10-CM

## 2020-09-22 MED ORDER — POTASSIUM CHLORIDE CRYS ER 20 MEQ PO TBCR: EXTENDED_RELEASE_TABLET | ORAL | 0 refills | Status: DC

## 2020-09-22 MED ORDER — VITAMIN D (ERGOCALCIFEROL) 1.25 MG (50000 UNIT) PO CAPS: 50000.0000 [IU] | ORAL_CAPSULE | ORAL | 0 refills | Status: DC

## 2020-09-26 NOTE — Progress Notes (Signed)
Spoke with patient, is aware of results. 

## 2020-09-27 NOTE — Progress Notes (Signed)
Results viewed on MyChart. 

## 2020-10-19 ENCOUNTER — Other Ambulatory Visit: Payer: Self-pay | Admitting: Family Medicine

## 2020-10-19 NOTE — Telephone Encounter (Signed)
gabapentin (NEURONTIN) 300 MG capsule  [facility] ([place]), [place] - [address] Phone:  [phone]  Fax:  [phone]

## 2020-11-09 ENCOUNTER — Other Ambulatory Visit: Payer: Self-pay | Admitting: Obstetrics and Gynecology

## 2020-11-09 NOTE — Telephone Encounter (Signed)
Patient said she is doing fine on medication, no issues. Annual exam now scheduled on 02/01/21

## 2020-11-09 NOTE — Telephone Encounter (Signed)
Medication refill request: aygestin 5mg  Last AEX:  02-16-2020 Next AEX: not scheduled Next OV: 02-01-2021 Last MMG (if hormonal medication request): 02-26-2020 category a density birads 1:neg Refill authorized: please approve until appt if appropriate

## 2020-11-09 NOTE — Telephone Encounter (Signed)
How is patient doing on Aygestin? She did not keep her appointment with me for February for her recheck on this medication.

## 2020-11-23 ENCOUNTER — Other Ambulatory Visit: Payer: Self-pay | Admitting: Family Medicine

## 2020-11-23 DIAGNOSIS — G8929 Other chronic pain: Secondary | ICD-10-CM

## 2020-11-24 MED ORDER — DULOXETINE HCL 30 MG PO CPEP: 30.0000 mg | ORAL_CAPSULE | Freq: Two times a day (BID) | ORAL | 0 refills | Status: DC

## 2020-11-24 NOTE — Telephone Encounter (Signed)
Last OV: 09/20/2020 Last Refill: 08/21/2020 Disp: 180 R: 0 Future OV: None scheduled

## 2020-12-14 ENCOUNTER — Other Ambulatory Visit: Payer: Self-pay | Admitting: Family Medicine

## 2020-12-14 DIAGNOSIS — I10 Essential (primary) hypertension: Secondary | ICD-10-CM

## 2020-12-14 MED ORDER — AMLODIPINE BESYLATE 10 MG PO TABS: 10.0000 mg | ORAL_TABLET | Freq: Every day | ORAL | 0 refills | Status: DC

## 2021-01-08 ENCOUNTER — Other Ambulatory Visit: Payer: Self-pay | Admitting: Family Medicine

## 2021-01-31 NOTE — Progress Notes (Signed)
45 y.o. G1P0001 Legally Separated African American female here for annual exam.    Last time she bled was in March for one day.  She is taking Aygestin 5 mg daily. She has pain radiating down her left leg when she thinks her cycle is coming on.  The pain lasts the duration of a usual cycle, like 2 - 3 days.  Please to not have long periods, because she does not want to have any bleeding.   Has done PT for pelvic floor.  Feels something coming down in the vagina.  Has hemorrhoid.  PCP:  Grier Mitts, MD  Patient's last menstrual period was 09/21/2020 (exact date).     Period Cycle (Days):  (on continuous OCPs)     Sexually active: Yes.    The current method of family planning is none.  Exercising: Yes.     Walks 3x/week, swimming Smoker:  no  Health Maintenance: Pap: 01/22/18 Neg:Neg HR HPV, 02-08-13 Neg History of abnormal Pap:  yes, remote past MMG: 02-26-20 3D/Neg/Birads1 Colonoscopy:  NEVER BMD:   n/a  Result  n/a TDaP:  01-14-18 Gardasil:   no HIV: 01-22-18 NR Hep C:01-22-18 Neg Screening Labs:  PCP   reports that she quit smoking about 3 years ago. Her smoking use included cigarettes. She has never used smokeless tobacco. She reports current alcohol use of about 2.0 - 3.0 standard drinks of alcohol per week. She reports current drug use. Drug: Marijuana.  Past Medical History:  Diagnosis Date   Anemia    Arthritis    Back pain    lower back    Complication of anesthesia    age 69 had twilight anesthesia and woke up during surgery    Depression    Fatty liver 01/22/2016   Noted on MRI abd   Fibroids    Heavy periods    History of gallstones    History of thyroid nodule    Left   Hypertension    Irregular periods    Obesity    Pancreatic lesion    Pinched nerve    in back   Ventral hernia 12/26/2014   Midline noted on CT Abd pelvis    Past Surgical History:  Procedure Laterality Date   benign breast cyst removed Left    age 65   CHOLECYSTECTOMY      INSERTION OF MESH N/A 02/27/2015   Procedure: INSERTION OF MESH;  Surgeon: Jackolyn Confer, MD;  Location: [facility];  Service: General;  Laterality: N/A;   IR ANGIOGRAM PELVIS SELECTIVE OR SUPRASELECTIVE  12/22/2018   IR ANGIOGRAM PELVIS SELECTIVE OR SUPRASELECTIVE  12/22/2018   IR ANGIOGRAM SELECTIVE EACH ADDITIONAL VESSEL  12/22/2018   IR ANGIOGRAM SELECTIVE EACH ADDITIONAL VESSEL  12/22/2018   IR EMBO TUMOR ORGAN ISCHEMIA INFARCT INC GUIDE ROADMAPPING  12/22/2018   IR RADIOLOGIST EVAL & MGMT  09/17/2018   IR RADIOLOGIST EVAL & MGMT  01/05/2019   IR RADIOLOGIST EVAL & MGMT  10/06/2019   IR US GUIDE VASC ACCESS RIGHT  12/22/2018   LAPAROSCOPIC ASSISTED VENTRAL HERNIA REPAIR N/A 02/27/2015   Procedure: LAPAROSCOPIC VENTRAL HERNIA REPAIR WITH MESH;  Surgeon: Jackolyn Confer, MD;  Location: [facility];  Service: General;  Laterality: N/A;   THYROIDECTOMY, PARTIAL     TOTAL HIP ARTHROPLASTY Right 11/28/2017   Procedure: RIGHT TOTAL HIP ARTHROPLASTY ANTERIOR APPROACH;  Surgeon: Mcarthur Rossetti, MD;  Location: [facility];  Service: Orthopedics;  Laterality: Right;   TOTAL HIP ARTHROPLASTY Left 07/31/2018  Procedure: LEFT TOTAL HIP ARTHROPLASTY ANTERIOR APPROACH;  Surgeon: Mcarthur Rossetti, MD;  Location: [facility];  Service: Orthopedics;  Laterality: Left;  Failed Spinal    Current Outpatient Medications  Medication Sig Dispense Refill   amLODipine (NORVASC) 10 MG tablet Take 1 tablet (10 mg total) by mouth daily. 90 tablet 0   diclofenac sodium (VOLTAREN) 1 % GEL Apply 2 g topically 4 (four) times daily. (Patient taking differently: Apply 2 g topically 4 (four) times daily as needed (pain).) 100 g 3   DULoxetine (CYMBALTA) 30 MG capsule Take 1 capsule (30 mg total) by mouth 2 (two) times daily. 180 capsule 0   Ferrous Sulfate (IRON) 325 (65 Fe) MG TABS Take 1 tablet by mouth daily. 90 tablet 0   gabapentin (NEURONTIN) 300 MG capsule TAKE 1 CAPSULE BY MOUTH THREE TIMES DAILY 90 capsule 0   metoprolol succinate  (TOPROL-XL) 50 MG 24 hr tablet Take 1 tablet (50 mg total) by mouth daily. Take with or immediately following a meal. 90 tablet 3   naproxen sodium (ALEVE) 220 MG tablet Take 220 mg by mouth as needed (pain/headache).      norethindrone (AYGESTIN) 5 MG tablet Take 1 tablet by mouth once daily 90 tablet 0   Vitamin D, Ergocalciferol, (DRISDOL) 1.25 MG (50000 UNIT) CAPS capsule Take 1 capsule (50,000 Units total) by mouth every 7 (seven) days. 12 capsule 0   No current facility-administered medications for this visit.    Family History  Problem Relation Age of Onset   Hypertension Father    Arthritis Father    Clotting disorder Father    Prostate cancer Paternal Grandfather     Review of Systems  Genitourinary:        Possible vaginal prolapse  All other systems reviewed and are negative.  Exam:   BP (!) 142/82   Pulse 96   Ht 5' 8.5" (1.74 m)   Wt 293 lb (132.9 kg)   LMP 09/21/2020 (Exact Date) Comment: light x1 day  SpO2 99%   BMI 43.90 kg/m     General appearance: alert, cooperative and appears stated age Head: normocephalic, without obvious abnormality, atraumatic Neck: no adenopathy, supple, symmetrical, trachea midline and thyroid normal to inspection and palpation Lungs: clear to auscultation bilaterally Breasts: normal appearance, no masses or tenderness, No nipple retraction or dimpling, No nipple discharge or bleeding, No axillary adenopathy Heart: regular rate and rhythm Abdomen: soft, non-tender; no masses, no organomegaly Extremities: extremities normal, atraumatic, no cyanosis or edema Skin: skin color, texture, turgor normal. No rashes or lesions Lymph nodes: cervical, supraclavicular, and axillary nodes normal. Neurologic: grossly normal  Pelvic: External genitalia:  no lesions              No abnormal inguinal nodes palpated.              Urethra:  normal appearing urethra with no masses, tenderness or lesions              Bartholins and Skenes: normal                  Vagina: normal appearing vagina with normal color and discharge, no lesions.  Slight cystocele and rectocele.               Cervix: no lesions              Pap taken: no Bimanual Exam:  Uterus:  12 week size.  Uterus well supported.  Adnexa: no mass, fullness, tenderness              Rectal exam: yes.  Confirms.              Anus:  normal sphincter tone, no lesions  Chaperone was present for exam:  Eustace Pen, CMA.  Assessment:   Well woman visit with gynecologic exam. Status post uterine artery embolization.  Uterine fibroids.  Hx pelvic pain.   On Aygestin 5 mg daily.  Chronic pain.  On Cymbalta and Neurontin.  Mild pelvic organ prolapse.   Plan: Mammogram screening discussed. Self breast awareness reviewed. Pap and HR HPV 2024.  Guidelines for Calcium, Vitamin D, regular exercise program including cardiovascular and weight bearing exercise. Will refill Aygestin 5 mg daily 90 day with 3 refills.  She will call to switch from Aygestin to Depo Provera if she needs contraception.  Follow up annually and prn.   After visit summary provided.

## 2021-02-01 ENCOUNTER — Other Ambulatory Visit: Payer: Self-pay

## 2021-02-01 ENCOUNTER — Encounter: Payer: Self-pay | Admitting: Obstetrics and Gynecology

## 2021-02-01 ENCOUNTER — Ambulatory Visit (INDEPENDENT_AMBULATORY_CARE_PROVIDER_SITE_OTHER): Payer: BC Managed Care – PPO | Admitting: Obstetrics and Gynecology

## 2021-02-01 VITALS — BP 142/82 | HR 96 | Ht 68.5 in | Wt 293.0 lb

## 2021-02-01 DIAGNOSIS — Z01419 Encounter for gynecological examination (general) (routine) without abnormal findings: Secondary | ICD-10-CM | POA: Diagnosis not present

## 2021-02-01 MED ORDER — NORETHINDRONE ACETATE 5 MG PO TABS: 5.0000 mg | ORAL_TABLET | Freq: Every day | ORAL | 3 refills | Status: DC

## 2021-02-01 NOTE — Patient Instructions (Signed)

## 2021-02-13 ENCOUNTER — Other Ambulatory Visit: Payer: Self-pay | Admitting: Family Medicine

## 2021-02-13 DIAGNOSIS — D509 Iron deficiency anemia, unspecified: Secondary | ICD-10-CM

## 2021-02-13 MED ORDER — GABAPENTIN 300 MG PO CAPS: 300.0000 mg | ORAL_CAPSULE | Freq: Three times a day (TID) | ORAL | 3 refills | Status: DC

## 2021-02-13 MED ORDER — IRON 325 (65 FE) MG PO TABS: 1.0000 | ORAL_TABLET | Freq: Every day | ORAL | 1 refills | Status: DC

## 2021-02-20 ENCOUNTER — Ambulatory Visit: Payer: BC Managed Care – PPO | Admitting: Obstetrics and Gynecology

## 2021-03-01 ENCOUNTER — Encounter: Payer: Self-pay | Admitting: Family Medicine

## 2021-03-05 ENCOUNTER — Other Ambulatory Visit: Payer: Self-pay | Admitting: Family Medicine

## 2021-03-05 DIAGNOSIS — G8929 Other chronic pain: Secondary | ICD-10-CM

## 2021-03-16 ENCOUNTER — Other Ambulatory Visit: Payer: Self-pay | Admitting: Family Medicine

## 2021-03-16 DIAGNOSIS — I10 Essential (primary) hypertension: Secondary | ICD-10-CM

## 2021-03-16 MED ORDER — AMLODIPINE BESYLATE 10 MG PO TABS: 10.0000 mg | ORAL_TABLET | Freq: Every day | ORAL | 0 refills | Status: DC

## 2021-03-22 IMAGING — XA IR EMBO TUMOR ORGAN ISCHEMIA INFARCT INC GUIDE ROADMAPPING
8 series · 13 of 24 positions shown · IV contrast (IODINE)
Comparison: none

CLINICAL DATA: Symptomatic uterine fibroids with menorrhagia and
dysmenorrhea.

[Series 1: care body 4 · 2 of 17 frames shown (1 of 6)]
[frame 3/17]
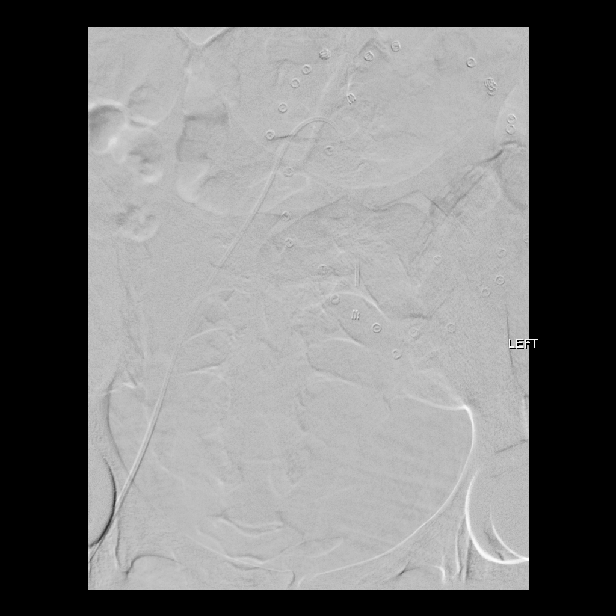
[frame 15/17]
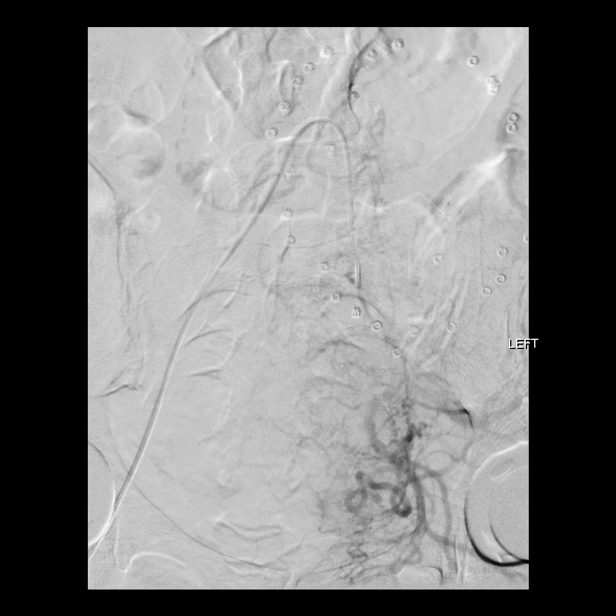

[Series 2: care body 4 · 1 of 35 frames shown (2 of 6)]
[frame 30/35]
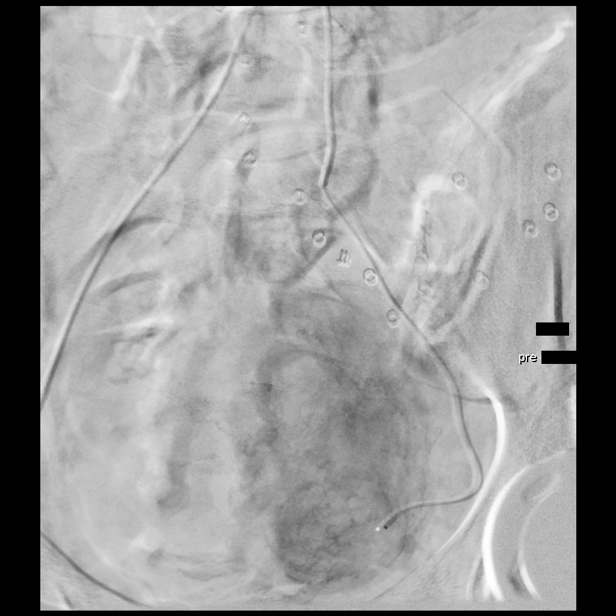

[Series 3: care body 4 · 1 of 42 frames shown (3 of 6)]
[frame 22/42]
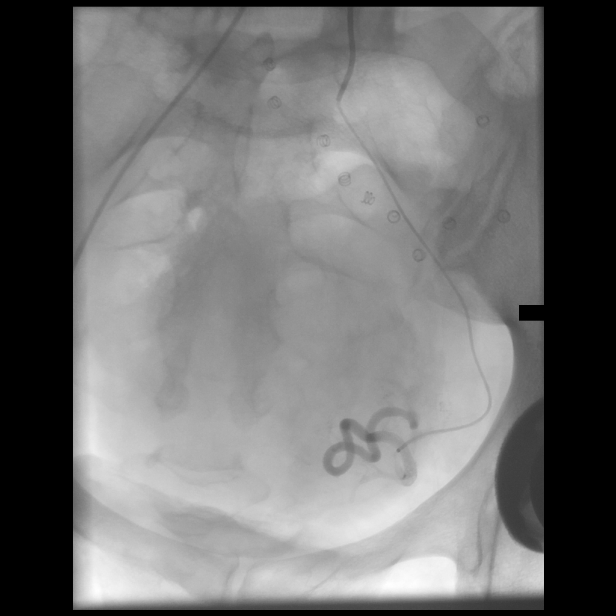

[Series 4: care body 4 · 2 of 20 frames shown (4 of 6)]
[frame 4/20]
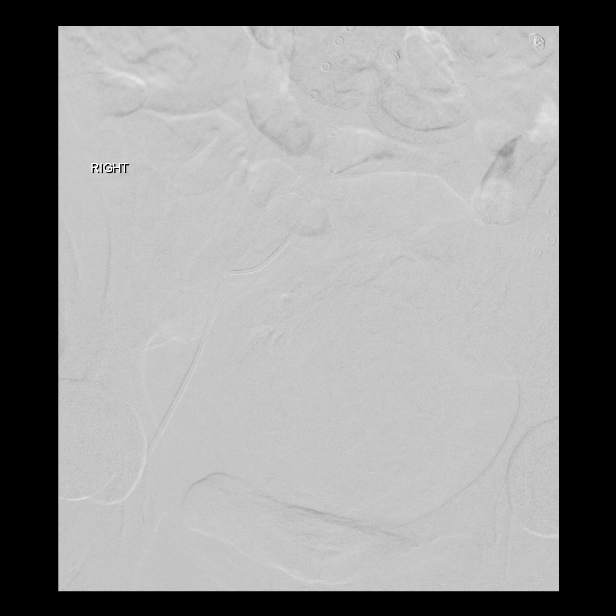
[frame 18/20]
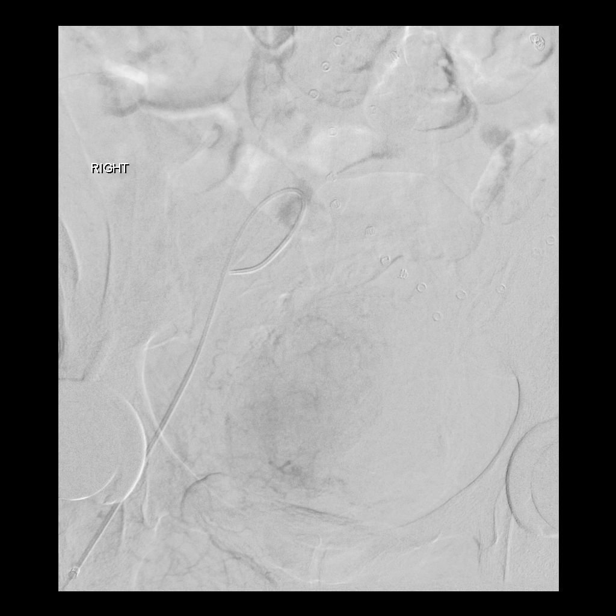

[Series 5: care body 4 · 1 of 27 frames shown (5 of 6)]
[frame 23/27]
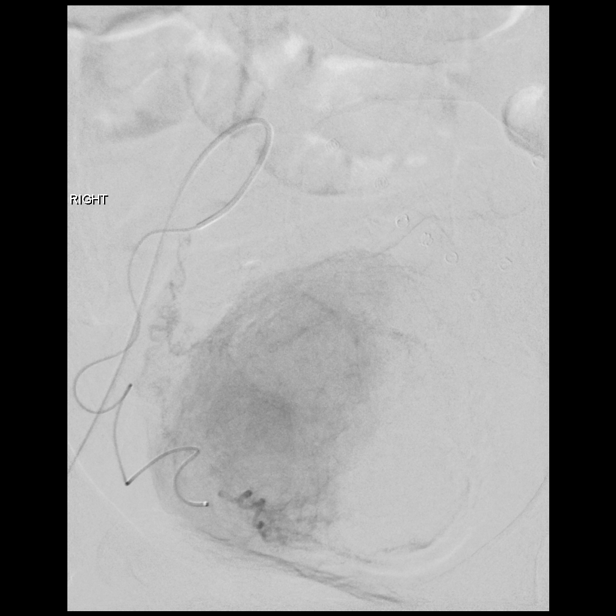

[Series 6: care body 4 · 2 of 33 frames shown (6 of 6)]
[frame 5/33]
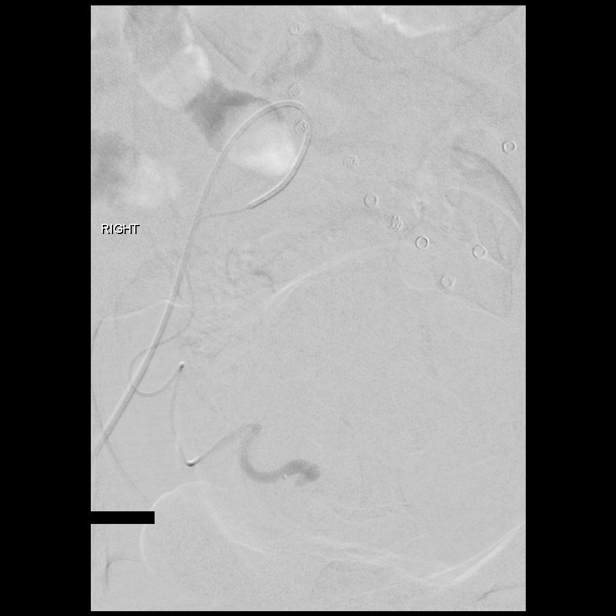
[frame 33/33  full-range]
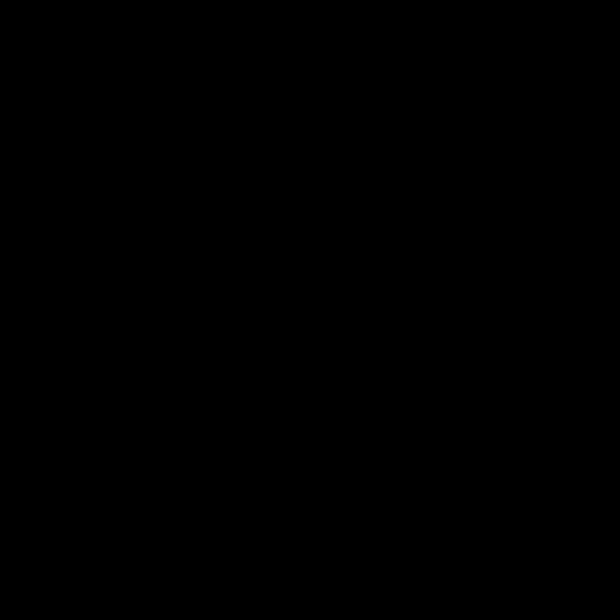

[Series 7: fl - angio · 1 of 37 frames shown]
[frame 19/37]
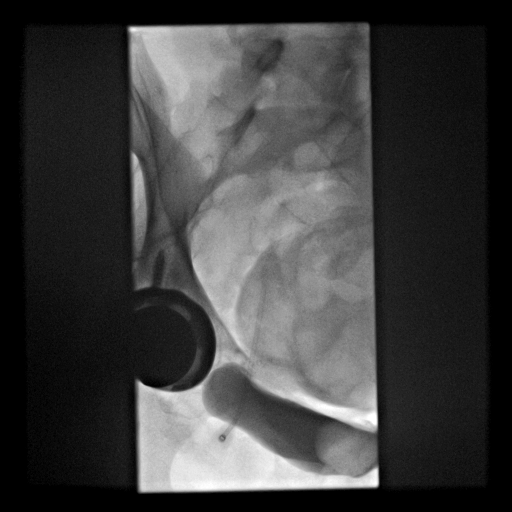

[Series 300: ld dsa body · 3 of 7 slices shown]
[im 1/7]
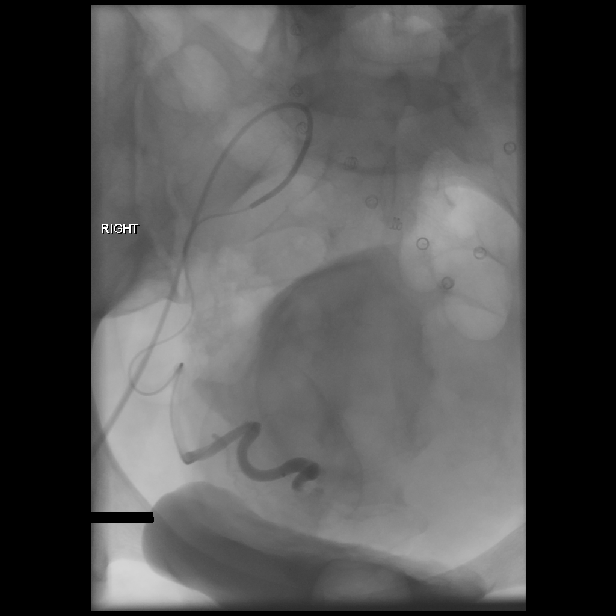
[im 4/7]
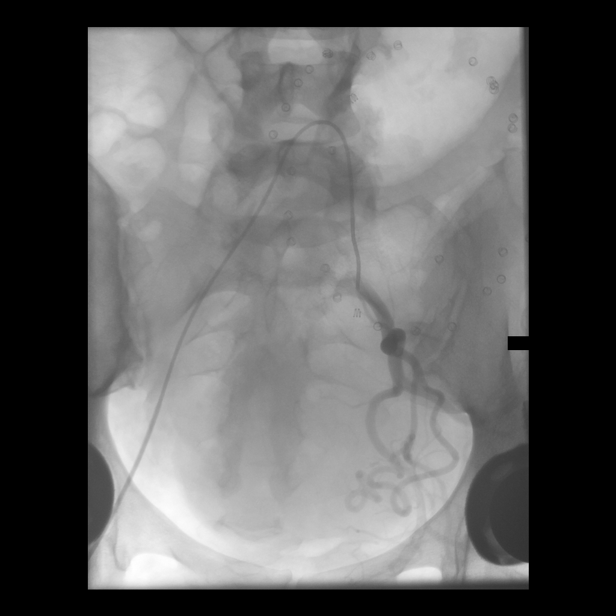
[im 7/7]
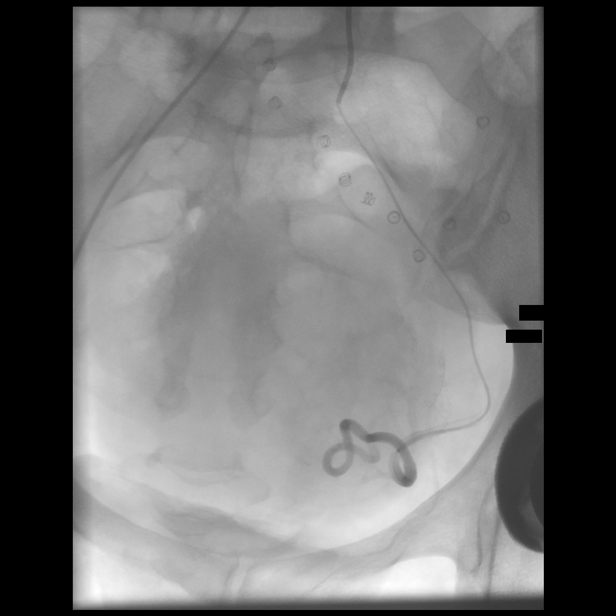

[13 of 24 positions shown; findings below may reference images not displayed]

EXAM:
1. ULTRASOUND GUIDANCE FOR VASCULAR ACCESS OF THE RIGHT COMMON
FEMORAL ARTERY
2. SELECTIVE PELVIC ARTERIOGRAPHY OF THE LEFT INTERNAL ILIAC ARTERY
3. ADDITIONAL SELECTIVE ARTERIOGRAPHY OF THE LEFT UTERINE ARTERY
4. TRANSCATHETER EMBOLIZATION OF THE LEFT UTERINE ARTERY TO TREAT
UTERINE FIBROIDS
5. SELECTIVE PELVIC ARTERIOGRAPHY OF THE RIGHT INTERNAL ILIAC ARTERY
6. ADDITIONAL SELECTIVE ARTERIOGRAPHY OF THE RIGHT UTERINE ARTERY
7. TRANSCATHETER EMBOLIZATION OF THE RIGHT UTERINE ARTERY TO TREAT
UTERINE FIBROIDS

ANESTHESIA/SEDATION:
Moderate (conscious) sedation was employed during this procedure. A
total of Versed 6.0 mg and Fentanyl 200 mcg was administered
intravenously.

Moderate Sedation Time: 58 minutes. The patient's level of
consciousness and vital signs were monitored continuously by
radiology nursing throughout the procedure under my direct
supervision.

MEDICATIONS:
2 g IV Ancef, 30 mg IV Toradol

FLUOROSCOPY TIME:  17 minutes and 6 seconds.  6333 mGy.

CONTRAST:  114 mL Omnipaque 300

PROCEDURE:
The procedure, risks, benefits, and alternatives were explained to
the patient. Questions regarding the procedure were encouraged and
answered. The patient understands and consents to the procedure. A
time-out was performed prior to initiating the procedure.

The right groin was prepped with chlorhexidine in a sterile fashion,
and a sterile drape was applied covering the operative field. A
sterile gown and sterile gloves were used for the procedure. Local
anesthesia was provided with 1% Lidocaine. Ultrasound was used to
confirm patency of the right common femoral artery. After a small
skin incision, a 21 gauge needle was advanced into the right common
femoral artery under direct ultrasound guidance. Ultrasound image
documentation was performed. After establishing guide wire access, a
5-French sheath was placed.

A 5 Fr diagnostic catheter was advanced over a guidewire into the
distal abdominal aorta. The catheter was then used to selectively
catheterize the left common iliac artery followed by the left
internal iliac artery. Selective arteriography was performed of the
left internal iliac artery.

A 2.8 Fr coaxial microcatheter was then introduced through the
diagnostic catheter and advanced into the left uterine artery over a
guide wire utilizing road mapping technique. Selective arteriography
of the left uterine artery was performed through the microcatheter.
Left uterine artery embolization was then performed with
installation of microsphere particles. Follow-up arteriography was
performed after embolization.

The microcatheter was removed. The diagnostic catheter was then
retracted and used to selectively catheterize the right internal
iliac artery. Selective arteriography was performed of the right
internal iliac artery. The microcatheter was then reintroduced and
advanced into the right uterine artery over a guidewire utilizing
road mapping technique. Selective arteriography of the right uterine
artery was then performed through the microcatheter. Right uterine
artery embolization was then performed with installation of
microsphere particles. Follow-up arteriography was performed after
embolization.

Right femoral arteriotomy hemostasis: Cordis ExoSeal

COMPLICATIONS:
None.
FINDINGS: Bilateral uterine arteriography shows multiple enlarged
hypervascular trunks supplying uterine fibroids.

Left uterine artery embolization was performed utilizing 1.5 vials
of 500-700 micron sized Embosphere particles. Completion
arteriography demonstrates adequate occlusion of branches supplying
uterine fibroids.

Right uterine artery embolization was performed utilizing 1.5 vials
of 500-700 micron sized Embosphere particles. Completion
arteriography demonstrates adequate occlusion of branches supplying
uterine fibroids.

Adequate hemostasis was achieved at the femoral arteriotomy site.
IMPRESSION: Successful bilateral uterine artery embolization to treat
symptomatic uterine fibroid disease. Adequate occlusion of branch
vessels supplying uterine fibroids was achieved with microsphere
particle embolization. The patient was admitted for overnight
observation for treatment of post embolization symptoms.

## 2021-03-24 LAB — HM MAMMOGRAPHY

## 2021-03-26 ENCOUNTER — Encounter: Payer: Self-pay | Admitting: Obstetrics and Gynecology

## 2021-03-27 ENCOUNTER — Encounter: Payer: Self-pay | Admitting: Family Medicine

## 2021-04-20 ENCOUNTER — Ambulatory Visit: Payer: BC Managed Care – PPO | Admitting: Family Medicine

## 2021-04-23 ENCOUNTER — Encounter: Payer: Self-pay | Admitting: Family Medicine

## 2021-04-23 ENCOUNTER — Ambulatory Visit: Payer: BC Managed Care – PPO | Admitting: Family Medicine

## 2021-04-23 ENCOUNTER — Other Ambulatory Visit: Payer: Self-pay

## 2021-04-23 VITALS — BP 132/88 | HR 77 | Temp 98.5°F | Wt 290.2 lb

## 2021-04-23 DIAGNOSIS — M25562 Pain in left knee: Secondary | ICD-10-CM

## 2021-04-23 DIAGNOSIS — M1712 Unilateral primary osteoarthritis, left knee: Secondary | ICD-10-CM | POA: Diagnosis not present

## 2021-04-23 DIAGNOSIS — R600 Localized edema: Secondary | ICD-10-CM | POA: Diagnosis not present

## 2021-04-23 DIAGNOSIS — R109 Unspecified abdominal pain: Secondary | ICD-10-CM | POA: Diagnosis not present

## 2021-04-23 DIAGNOSIS — G8929 Other chronic pain: Secondary | ICD-10-CM

## 2021-04-23 MED ORDER — TRAMADOL HCL 50 MG PO TABS
50.0000 mg | ORAL_TABLET | Freq: Three times a day (TID) | ORAL | 0 refills | Status: AC | PRN
Start: 2021-04-23 — End: 2021-04-28

## 2021-04-23 MED ORDER — PREDNISONE 10 MG PO TABS: ORAL_TABLET | ORAL | 0 refills | Status: DC

## 2021-04-23 NOTE — Progress Notes (Signed)
Subjective:    Patient ID: Rachel Warren, female    DOB: 12-09-1975, 45 y.o.   MRN: [id number]  Chief Complaint  Patient presents with   Breathing Problem    Takes an extra breath and not realizes it, used to do it in the past.   Knee Pain    Left knee pain, flammed. Was just aggy.   Hernia    Discomfort and twist like pain     HPI Patient was seen today for ongoing symptoms.   Pt endorses increased L knee pain, edema, and erythema.  Pain is described as aching, burning in middle, lateral side and posterior knee.  Pt denies instability, catching, or popping sensation.  Voltaren gel, tylenol, heat, ice no longer helping.  At night pain wakes pt up around 2 or 3 am.  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  Has to use cane when walking up a hill at school 2/2 pain.  Pt notes a twisting, cramping sensation that comes across her abdomen towards her mesh after urinating and defecating.  Pt denies having to strain to have BMs.  Drinking 5-6 bottles of water daily at work and at least 1-2 more at home.  Eating fish or chicken with vegetables for dinner.  Eats a peanut butter and honey sandwich, a piece of fruit, and a yogurt for lunch at work.  Pt has handicap placard form for completion.  Pt going on vacation to the [place] on Wednesday. Past Medical History:  Diagnosis Date   Anemia    Arthritis    Back pain    lower back    Complication of anesthesia    age 13 had twilight anesthesia and woke up during surgery    Depression    Fatty liver 01/22/2016   Noted on MRI abd   Fibroids    Heavy periods    History of gallstones    History of thyroid nodule    Left   Hypertension    Irregular periods    Obesity    Pancreatic lesion    Pinched nerve    in back   Ventral hernia 12/26/2014   Midline noted on CT Abd pelvis    No Known Allergies  ROS General: Denies fever, chills, night sweats, changes in weight, changes in  appetite HEENT: Denies headaches, ear pain, changes in vision, rhinorrhea, sore throat CV: Denies CP, palpitations, SOB, orthopnea Pulm: Denies SOB, cough, wheezing GI: Denies abdominal pain, nausea, vomiting, diarrhea, constipation +abdominal cramping GU: Denies dysuria, hematuria, frequency, vaginal discharge Msk: Denies muscle cramps, joint pains  +L knee pain, b/l LE edema Neuro: Denies weakness, numbness, tingling Skin: Denies rashes, bruising Psych: Denies depression, anxiety, hallucinations     Objective:    Blood pressure 132/88, pulse 77, temperature 98.5 F (36.9 C), temperature source Oral, weight 290 lb 3.2 oz (131.6 kg), SpO2 96 %.  Gen. Pleasant, well-nourished, in no distress, normal affect   HEENT: [place]/AT, face symmetric, conjunctiva clear, no scleral icterus, PERRLA, EOMI, nares patent without drainage Lungs: no accessory muscle use Cardiovascular: RRR, trace edema in b/l LEs at ankle Musculoskeletal: Discomfort with extending LLE at knee.  No crepitus. Minimal effusion with increased warmth and TT mild palpation.  TTP of joint line of left lateral knee, popliteal fossa, and midline inferior  L knee and tibial tuberosity. R knee without crepitus, TTP, increased warmth.  No deformities, no cyanosis or clubbing, normal tone Neuro:  A&Ox3, CN II-XII intact, ambulating with a cane. Skin:  Warm, no lesions/ rash   Wt Readings from Last 3 Encounters:  04/23/21 290 lb 3.2 oz (131.6 kg)  02/01/21 293 lb (132.9 kg)  09/20/20 295 lb (133.8 kg)    Lab Results  Component Value Date   WBC 8.0 09/20/2020   HGB 16.1 (H) 09/20/2020   HCT 47.1 (H) 09/20/2020   PLT 217.0 09/20/2020   GLUCOSE 90 09/20/2020   CHOL 170 09/20/2020   TRIG 131.0 09/20/2020   HDL 38.10 (L) 09/20/2020   LDLCALC 105 (H) 09/20/2020   ALT 11 03/12/2019   AST 14 03/12/2019   NA 141 09/20/2020   K 3.2 (L) 09/20/2020   CL 103 09/20/2020   CREATININE 0.78 09/20/2020   BUN 8 09/20/2020   CO2 30  09/20/2020   TSH 1.63 09/20/2020   INR 1.00 02/22/2015   HGBA1C 5.3 09/20/2020    Assessment/Plan:  Chronic pain of left knee  -Discussed possible causes including arthritis, ligament injury, or cartilage injury.  Also consider gout. -X-ray left knee 10/10/2020 with mild osteophytic changes laterally in the patellofemoral space. -Discussed supportive care including continuing heat, ice, stretching, topical analgesics, Tylenol -Minimal effusion on exam.  Consider arthrocentesis for worsening edema -We will start prednisone taper and limited supply of tramadol as needed. -Given increased symptoms discussed obtaining MRI and Ortho referral -Form for handicap placard completed -PDMP reviewed - Plan: Ambulatory referral to Orthopedic Surgery, traMADol (ULTRAM) 50 MG tablet, predniSONE (DELTASONE) 10 MG tablet  Arthritis of left knee  -DG L knee 09/20/20 with mild osteophytic changes laterally and in the patellofemoral space. -continue supportive care as stated above - Plan: Ambulatory referral to Orthopedic Surgery, predniSONE (DELTASONE) 10 MG tablet  Abdominal cramping -Discussed possible causes including electrolyte abnormality, adhesions from previous surgery causing discomfort, etc -Increase p.o. intake of water and fiber to avoid constipation -Continue to monitor  Bilateral lower extremity edema -Discussed supportive care including TED hose, compression socks, elevating lower extremities when sitting -Continue to decrease sodium intake -Consider low-dose Lasix for continued or worsening symptoms  F/u as needed  Grier Mitts, MD

## 2021-05-09 ENCOUNTER — Encounter: Payer: Self-pay | Admitting: Orthopaedic Surgery

## 2021-05-09 ENCOUNTER — Ambulatory Visit: Payer: BC Managed Care – PPO | Admitting: Orthopaedic Surgery

## 2021-05-09 VITALS — Ht 68.5 in | Wt 294.0 lb

## 2021-05-09 DIAGNOSIS — G8929 Other chronic pain: Secondary | ICD-10-CM | POA: Diagnosis not present

## 2021-05-09 DIAGNOSIS — M25562 Pain in left knee: Secondary | ICD-10-CM

## 2021-05-09 MED ORDER — METHYLPREDNISOLONE ACETATE 40 MG/ML IJ SUSP
40.0000 mg | INTRAMUSCULAR | Status: AC | PRN
  Administered 2021-05-09: 40 mg via INTRA_ARTICULAR

## 2021-05-09 MED ORDER — LIDOCAINE HCL 1 % IJ SOLN
3.0000 mL | INTRAMUSCULAR | Status: AC | PRN
  Administered 2021-05-09: 3 mL

## 2021-05-09 NOTE — Progress Notes (Signed)
Office Visit Note   Patient: Rachel Warren           Date of Birth: [birth year]           MRN: [id number] Visit Date: 05/09/2021              Requested by: Billie Ruddy, MD [place],   [id number] PCP: Billie Ruddy, MD   Assessment & Plan: Visit Diagnoses:  1. Chronic pain of left knee     Plan: I talked her in length about her x-rays and clinical exam findings with her left knee.  I do feel it is worth trying a steroid injection in her left knee today since she has not had any type of intervention such as this and she agreed to it and tolerated it well.  I would like to see her back in about 4 weeks to see how she is responded to this.  My next step would be considering an MRI of this knee if she still having issues.  Follow-Up Instructions: Return in about 4 weeks (around 06/06/2021).   Orders:  Orders Placed This Encounter  Procedures   Large Joint Inj   No orders of the defined types were placed in this encounter.     Procedures: Large Joint Inj: L knee on 05/09/2021 5:11 PM Indications: diagnostic evaluation and pain Details: 22 G 1.5 in needle, superolateral approach  Arthrogram: No  Medications: 3 mL lidocaine 1 %; 40 mg methylPREDNISolone acetate 40 MG/ML Outcome: tolerated well, no immediate complications Procedure, treatment alternatives, risks and benefits explained, specific risks discussed. Consent was given by the patient. Immediately prior to procedure a time out was called to verify the correct patient, procedure, equipment, support staff and site/side marked as required. Patient was prepped and draped in the usual sterile fashion.      Clinical Data: No additional findings.   Subjective: No chief complaint on file. The patient is well-known to me.  I have actually replaced both of her hips.  She is only 45 years old.  She does ambulate with a cane due to issues he is been having chronically with her left knee.   She has had pain for a very long amount of time with her left knee and is been worsening.  They are x-rays on the canopy system for me to review.  She has not had any type of intervention for her knee in terms of any surgery or even injections for the left knee.  He mainly just hurts.  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  HPI  Review of Systems   Objective: Vital Signs: Ht 5' 8.5" (1.74 m)   Wt 294 lb (133.4 kg)   BMI 44.05 kg/m   Physical Exam She is alert and orient x3 in no acute distress Ortho Exam Examination of the left knee shows some global tenderness but no effusion.  The knee has good range of motion and is ligamentously stable. Specialty Comments:  No specialty comments available.  Imaging: No results found. X-rays that were independent reviewed of the left knee show no acute findings.  The joint spaces still well-maintained.  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   Fibroids 01/22/2018   Status post total replacement of  right hip 11/28/2017   Unilateral primary osteoarthritis, left hip 11/06/2017   Unilateral primary osteoarthritis, right hip 11/06/2017   Lumbosacral radiculopathy at S1 09/12/2017   Back pain 08/19/2017   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   Abnormal gait 08/19/2017   Obesity 08/19/2017   Ventral hernia s/p laparoscopic repair with mesh 02/27/15 02/27/2015   Past Medical History:  Diagnosis Date   Anemia    Arthritis    Back pain    lower back    Complication of anesthesia    age 71 had twilight anesthesia and woke up during surgery    Depression    Fatty liver 01/22/2016   Noted on MRI abd   Fibroids    Heavy periods    History of gallstones    History of thyroid nodule    Left   Hypertension    Irregular periods     Obesity    Pancreatic lesion    Pinched nerve    in back   Ventral hernia 12/26/2014   Midline noted on CT Abd pelvis    Family History  Problem Relation Age of Onset   Hypertension Father    Arthritis Father    Clotting disorder Father    Prostate cancer Paternal Grandfather     Past Surgical History:  Procedure Laterality Date   benign breast cyst removed Left    age 31   CHOLECYSTECTOMY     INSERTION OF MESH N/A 02/27/2015   Procedure: INSERTION OF MESH;  Surgeon: Jackolyn Confer, MD;  Location: [facility];  Service: General;  Laterality: N/A;   IR ANGIOGRAM PELVIS SELECTIVE OR SUPRASELECTIVE  12/22/2018   IR ANGIOGRAM PELVIS SELECTIVE OR SUPRASELECTIVE  12/22/2018   IR ANGIOGRAM SELECTIVE EACH ADDITIONAL VESSEL  12/22/2018   IR ANGIOGRAM SELECTIVE EACH ADDITIONAL VESSEL  12/22/2018   IR EMBO TUMOR ORGAN ISCHEMIA INFARCT INC GUIDE ROADMAPPING  12/22/2018   IR RADIOLOGIST EVAL & MGMT  09/17/2018   IR RADIOLOGIST EVAL & MGMT  01/05/2019   IR RADIOLOGIST EVAL & MGMT  10/06/2019   IR US GUIDE VASC ACCESS RIGHT  12/22/2018   LAPAROSCOPIC ASSISTED VENTRAL HERNIA REPAIR N/A 02/27/2015   Procedure: LAPAROSCOPIC VENTRAL HERNIA REPAIR WITH MESH;  Surgeon: Jackolyn Confer, MD;  Location: [facility];  Service: General;  Laterality: N/A;   THYROIDECTOMY, PARTIAL     TOTAL HIP ARTHROPLASTY Right 11/28/2017   Procedure: RIGHT TOTAL HIP ARTHROPLASTY ANTERIOR APPROACH;  Surgeon: Mcarthur Rossetti, MD;  Location: [facility];  Service: Orthopedics;  Laterality: Right;   TOTAL HIP ARTHROPLASTY Left 07/31/2018   Procedure: LEFT TOTAL HIP ARTHROPLASTY ANTERIOR APPROACH;  Surgeon: Mcarthur Rossetti, MD;  Location: [facility];  Service: Orthopedics;  Laterality: Left;  Failed Spinal   Social History   Occupational History   Not on file  Tobacco Use   Smoking status: Former    Types: Cigarettes    Quit date: 06/14/2017    Years since quitting: 3.9   Smokeless tobacco: Never  Vaping Use   Vaping Use: Never used   Substance and Sexual Activity   Alcohol use: Yes    Alcohol/week: 2.0 - 3.0 standard drinks    Types: 2 - 3 Glasses of wine per week   Drug use: Yes    Types: Marijuana    Comment: last time 07/08/2018   Sexual activity: Yes    Birth control/protection: Pill    Comment: continuous OCP

## 2021-06-02 ENCOUNTER — Other Ambulatory Visit: Payer: Self-pay | Admitting: Family Medicine

## 2021-06-02 DIAGNOSIS — I10 Essential (primary) hypertension: Secondary | ICD-10-CM

## 2021-06-04 MED ORDER — AMLODIPINE BESYLATE 10 MG PO TABS: 10.0000 mg | ORAL_TABLET | Freq: Every day | ORAL | 1 refills | Status: DC

## 2021-07-31 ENCOUNTER — Other Ambulatory Visit: Payer: Self-pay | Admitting: Family Medicine

## 2021-07-31 DIAGNOSIS — D509 Iron deficiency anemia, unspecified: Secondary | ICD-10-CM

## 2021-09-19 ENCOUNTER — Other Ambulatory Visit: Payer: Self-pay | Admitting: Family Medicine

## 2021-09-19 DIAGNOSIS — G8929 Other chronic pain: Secondary | ICD-10-CM

## 2021-09-21 ENCOUNTER — Encounter: Payer: BC Managed Care – PPO | Admitting: Family Medicine

## 2021-09-26 ENCOUNTER — Ambulatory Visit (INDEPENDENT_AMBULATORY_CARE_PROVIDER_SITE_OTHER): Payer: BC Managed Care – PPO | Admitting: Family Medicine

## 2021-09-26 ENCOUNTER — Encounter: Payer: Self-pay | Admitting: Family Medicine

## 2021-09-26 ENCOUNTER — Other Ambulatory Visit (HOSPITAL_COMMUNITY)
Admission: RE | Admit: 2021-09-26 | Discharge: 2021-09-26 | Disposition: A | Discharge status: Home / Self Care | Payer: BC Managed Care – PPO | Source: Ambulatory Visit | Attending: Family Medicine | Admitting: Family Medicine

## 2021-09-26 VITALS — BP 136/98 | HR 77 | Temp 99.1°F | Ht 68.0 in | Wt 287.4 lb

## 2021-09-26 DIAGNOSIS — Z0000 Encounter for general adult medical examination without abnormal findings: Secondary | ICD-10-CM | POA: Diagnosis not present

## 2021-09-26 DIAGNOSIS — F40243 Fear of flying: Secondary | ICD-10-CM | POA: Diagnosis not present

## 2021-09-26 DIAGNOSIS — R0689 Other abnormalities of breathing: Secondary | ICD-10-CM

## 2021-09-26 DIAGNOSIS — Z23 Encounter for immunization: Secondary | ICD-10-CM

## 2021-09-26 DIAGNOSIS — R635 Abnormal weight gain: Secondary | ICD-10-CM | POA: Diagnosis not present

## 2021-09-26 DIAGNOSIS — Z1211 Encounter for screening for malignant neoplasm of colon: Secondary | ICD-10-CM

## 2021-09-26 DIAGNOSIS — Z124 Encounter for screening for malignant neoplasm of cervix: Secondary | ICD-10-CM | POA: Diagnosis not present

## 2021-09-26 LAB — LIPID PANEL
Cholesterol: 180 mg/dL (ref 0–200)
HDL: 34.4 mg/dL — ABNORMAL LOW (ref >39.00)
LDL Cholesterol: 118 mg/dL — ABNORMAL HIGH (ref 0–99)
NonHDL: 145.26
Total CHOL/HDL Ratio: 5
Triglycerides: 137 mg/dL (ref 0.0–149.0)
VLDL: 27.4 mg/dL (ref 0.0–40.0)

## 2021-09-26 LAB — COMPREHENSIVE METABOLIC PANEL
ALT: 18 U/L (ref 0–35)
AST: 21 U/L (ref 0–37)
Albumin: 4.2 g/dL (ref 3.5–5.2)
Alkaline Phosphatase: 63 U/L (ref 39–117)
BUN: 7 mg/dL (ref 6–23)
CO2: 30 mEq/L (ref 19–32)
Calcium: 9.2 mg/dL (ref 8.4–10.5)
Chloride: 104 mEq/L (ref 96–112)
Creatinine, Ser: 0.92 mg/dL (ref 0.40–1.20)
GFR: 74.41 mL/min (ref >60.00)
Glucose, Bld: 100 mg/dL — ABNORMAL HIGH (ref 70–99)
Potassium: 3.1 mEq/L — ABNORMAL LOW (ref 3.5–5.1)
Sodium: 145 mEq/L (ref 135–145)
Total Bilirubin: 0.7 mg/dL (ref 0.2–1.2)
Total Protein: 6.7 g/dL (ref 6.0–8.3)

## 2021-09-26 LAB — CBC WITH DIFFERENTIAL/PLATELET
Basophils Absolute: 0 10*3/uL (ref 0.0–0.1)
Basophils Relative: 0.3 % (ref 0.0–3.0)
Eosinophils Absolute: 0 10*3/uL (ref 0.0–0.7)
Eosinophils Relative: 0.1 % (ref 0.0–5.0)
HCT: 47.2 % — ABNORMAL HIGH (ref 36.0–46.0)
Hemoglobin: 16.2 g/dL — ABNORMAL HIGH (ref 12.0–15.0)
Lymphocytes Relative: 24.9 % (ref 12.0–46.0)
Lymphs Abs: 1.7 10*3/uL (ref 0.7–4.0)
MCHC: 34.2 g/dL (ref 30.0–36.0)
MCV: 92.6 fl (ref 78.0–100.0)
Monocytes Absolute: 0.4 10*3/uL (ref 0.1–1.0)
Monocytes Relative: 6.3 % (ref 3.0–12.0)
Neutro Abs: 4.6 10*3/uL (ref 1.4–7.7)
Neutrophils Relative %: 68.4 % (ref 43.0–77.0)
Platelets: 207 10*3/uL (ref 150.0–400.0)
RBC: 5.1 Mil/uL (ref 3.87–5.11)
RDW: 13.8 % (ref 11.5–15.5)
WBC: 6.7 10*3/uL (ref 4.0–10.5)

## 2021-09-26 LAB — TSH: TSH: 1.33 u[IU]/mL (ref 0.35–5.50)

## 2021-09-26 LAB — HEMOGLOBIN A1C: Hgb A1c MFr Bld: 5.4 % (ref 4.6–6.5)

## 2021-09-26 LAB — VITAMIN D 25 HYDROXY (VIT D DEFICIENCY, FRACTURES): VITD: 40.9 ng/mL (ref 30.00–100.00)

## 2021-09-26 LAB — T4, FREE: Free T4: 0.84 ng/dL (ref 0.60–1.60)

## 2021-09-26 MED ORDER — ALPRAZOLAM 0.25 MG PO TABS: 0.2500 mg | ORAL_TABLET | ORAL | 0 refills | Status: DC | PRN

## 2021-09-26 NOTE — Progress Notes (Signed)
Subjective:  ?  ? Rachel Warren is a 46 y.o. female and is here for a comprehensive physical exam. The patient reports gaining weight.  Exercising and making diet changes but has not noticed any weight loss.  Patient increased water intake.  Patient has an upcoming trip to [facility], states needs something for the flight 2/2 anxiety.  Patient mentions episodes of gasping for air.  Occurs with increased talking.  States does not feel short of breath or anxious at time of episode.  Episode occurred during visit, appears similar to that of a hiccup.  Patient looking for an OB/GYN provider.  Request Pap this visit and influenza vaccine.  Mammogram done 03/26/2021.  Colonoscopy due. ? ?Social History  ? ?Socioeconomic History  ? Marital status: Divorced  ?  Spouse name: Not on file  ? Number of children: Not on file  ? Years of education: Not on file  ? Highest education level: Not on file  ?Occupational History  ? Not on file  ?Tobacco Use  ? Smoking status: Former  ?  Types: Cigarettes  ?  Quit date: 06/14/2017  ?  Years since quitting: 4.2  ? Smokeless tobacco: Never  ?Vaping Use  ? Vaping Use: Never used  ?Substance and Sexual Activity  ? Alcohol use: Yes  ?  Alcohol/week: 2.0 - 3.0 standard drinks  ?  Types: 2 - 3 Glasses of wine per week  ? Drug use: Yes  ?  Types: Marijuana  ?  Comment: last time 07/08/2018  ? Sexual activity: Yes  ?  Birth control/protection: Pill  ?  Comment: continuous OCP  ?Other Topics Concern  ? Not on file  ?Social History Narrative  ? Not on file  ? ?Social Determinants of Health  ? ?Financial Resource Strain: Not on file  ?Food Insecurity: Not on file  ?Transportation Needs: Not on file  ?Physical Activity: Not on file  ?Stress: Not on file  ?Social Connections: Not on file  ?Intimate Partner Violence: Not on file  ? ?Health Maintenance  ?Topic Date Due  ? COLONOSCOPY (Pts 45-81yr Insurance coverage will need to be confirmed)  Never done  ? COVID-19 Vaccine (3 - Booster for Pfizer  series) 12/10/2019  ? PAP SMEAR-Modifier  01/22/2021  ? TETANUS/TDAP  01/15/2028  ? INFLUENZA VACCINE  Completed  ? Hepatitis C Screening  Completed  ? HIV Screening  Completed  ? HPV VACCINES  Aged Out  ? ? ?The following portions of the patient's history were reviewed and updated as appropriate: allergies, current medications, past family history, past medical history, past social history, past surgical history, and problem list. ? ?Review of Systems ?Pertinent items noted in HPI and remainder of comprehensive ROS otherwise negative.  ? ?Objective:  ? ? BP (!) 136/98 (BP Location: Left Arm, Patient Position: Sitting, Cuff Size: Normal)   Pulse 77   Temp 99.1 ?F (37.3 ?C) (Oral)   Ht '5\' 8"'$  (1.727 m)   Wt 287 lb 6.4 oz (130.4 kg)   SpO2 99%   BMI 43.70 kg/m?  ?General appearance: alert, cooperative, and no distress ?Head: Normocephalic, without obvious abnormality, atraumatic ?Eyes: conjunctivae/corneas clear. PERRL, EOM's intact. Fundi benign. ?Ears: normal TM's and external ear canals both ears ?Nose: Nares normal. Septum midline. Mucosa normal. No drainage or sinus tenderness. ?Throat: lips, mucosa, and tongue normal; teeth and gums normal ?Neck: no adenopathy, no carotid bruit, no JVD, supple, symmetrical, trachea midline, and mild thyromegaly ?Lungs: clear to auscultation bilaterally during conversation  pt paused to forcefully taken air. ?Breasts: normal appearance, no masses or tenderness, No nipple retraction or dimpling, No nipple discharge or bleeding, Normal to palpation without dominant masses ?Heart: regular rate and rhythm, S1, S2 normal, no murmur, click, rub or gallop ?Abdomen: soft, non-tender; bowel sounds normal; no masses,  no organomegaly ?Pelvic: Skin of external genitalia dry, scant amount of whitish d/c in vaginal vault and cervical os. cervix normal in appearance, external genitalia normal, no adnexal masses or tenderness, no cervical motion tenderness, rectovaginal septum normal, and  uterus normal size, shape, and consistency.  Pap collected. ?Extremities: extremities normal, atraumatic, no cyanosis or edema ?Pulses: 2+ and symmetric ?Skin: Skin color, texture, turgor normal. No rashes or lesions ?Lymph nodes: Cervical, supraclavicular, and axillary nodes normal. ?Neurologic: Alert and oriented X 3, normal strength and tone. Normal symmetric reflexes. Normal coordination and gait  ?  ?Assessment:  ? ? Healthy female exam.    ?  ?Plan:  ? ? Anticipatory guidance given including wearing seatbelts, smoke detectors in the home, increasing physical activity, increasing p.o. intake of water and vegetables. ?-labs ?-pap done this visit ?-mammogram done 03/26/21 ?-Colonoscopy ordered this visit ?-Immunizations reviewed.  Influenza vaccine given this visit.  Patient will obtain COVID booster at local pharmacy. ?-Given handout ?-Next CPE in 1 year ?See After Visit Summary for Counseling Recommendations  ? ?Need for influenza vaccination  ?- Plan: Flu Vaccine QUAD 6+ mos PF IM (Fluarix Quad PF) ? ?Cervical cancer screening  ?- Plan: PAP [[facility]] ? ?Gasping for breath ?-Episodes of gasping appeared similar to that of a hiccup during exam ?-pO2 99% ?-We will obtain labs including CMP to evaluate for possible causes of phrenic nerve irritation such as liver etiology. ?-Referral to pulmonology placed. ?- Plan: TSH, T4, Free, CMP, CBC with Differential/Platelet, Ambulatory referral to Pulmonology ? ?Weight gain ?-Body mass index is 43.7 kg/m?. ?-We will obtain labs to evaluate thyroid function or vitamin deficiency as possible cause ?-Discussed continuing lifestyle modifications ?- Plan: TSH, T4, Free, Hemoglobin A1c, Lipid panel, CMP, Vitamin D, 25-hydroxy ? ?Colon cancer screening ?- Plan: Ambulatory referral to Gastroenterology ? ?Anxiety with flying  ?-Upcoming trip to [facility] ?-Limited supply of Xanax provided for travel ?-PDMP reviewed ?- Plan: ALPRAZolam (XANAX) 0.25 MG tablet ? ?F/u prn in the  next month for continued or worsening symptoms ? ?Grier Mitts, MD ? ?

## 2021-09-26 NOTE — Patient Instructions (Addendum)
A referral was placed for you to see the pulmonologist for the gasping sensation.  You should expect a phone call about this. ? ?Referral was also placed for colonoscopy. ? ?A prescription was sent for anxiety medication for use prior to flying. ? ? ?

## 2021-09-27 ENCOUNTER — Other Ambulatory Visit: Payer: Self-pay | Admitting: Family Medicine

## 2021-09-27 MED ORDER — POTASSIUM CHLORIDE CRYS ER 20 MEQ PO TBCR: EXTENDED_RELEASE_TABLET | ORAL | 0 refills | Status: DC

## 2021-09-28 ENCOUNTER — Other Ambulatory Visit: Payer: Self-pay | Admitting: Family Medicine

## 2021-09-28 DIAGNOSIS — B9689 Other specified bacterial agents as the cause of diseases classified elsewhere: Secondary | ICD-10-CM

## 2021-09-28 LAB — CYTOLOGY - PAP
Chlamydia: NEGATIVE
Comment: NEGATIVE
Comment: NEGATIVE
Comment: NEGATIVE
Comment: NORMAL
Diagnosis: NEGATIVE
High risk HPV: NEGATIVE
Neisseria Gonorrhea: NEGATIVE
Trichomonas: NEGATIVE

## 2021-09-28 MED ORDER — METRONIDAZOLE 500 MG PO TABS
500.0000 mg | ORAL_TABLET | Freq: Two times a day (BID) | ORAL | 0 refills | Status: AC
Start: 2021-09-28 — End: 2021-10-05

## 2021-10-01 ENCOUNTER — Other Ambulatory Visit: Payer: Self-pay | Admitting: Family Medicine

## 2021-10-01 DIAGNOSIS — I10 Essential (primary) hypertension: Secondary | ICD-10-CM

## 2021-10-11 ENCOUNTER — Encounter: Payer: Self-pay | Admitting: Gastroenterology

## 2021-10-17 ENCOUNTER — Ambulatory Visit (AMBULATORY_SURGERY_CENTER): Payer: BC Managed Care – PPO

## 2021-10-17 VITALS — Ht 68.0 in | Wt 287.0 lb

## 2021-10-17 DIAGNOSIS — Z1211 Encounter for screening for malignant neoplasm of colon: Secondary | ICD-10-CM

## 2021-10-17 MED ORDER — CLENPIQ 10-3.5-12 MG-GM -GM/160ML PO SOLN: 1.0000 | ORAL | 0 refills | Status: DC

## 2021-10-17 NOTE — Progress Notes (Signed)
No egg or soy allergy known to patient  ?No issues known to pt with past sedation with any surgeries or procedures=patient chart noted that at age 46 patient had "twilight sedation"- patient "woke up" ?Patient denies ever being told they had issues or difficulty with intubation = ?No FH of Malignant Hyperthermia ?Pt is not on diet pills ?Pt is not on home 02  ?Pt is not on blood thinners  ?Pt denies issues with constipation;  ?No A fib or A flutter ?Coupon code to Pharmacy via RX=BIN: [id number] PCN: [id number] WLN:[id number] [id number]  EXP 10/03/2022 ?NO PA's for preps discussed with pt in PV today  ?Discussed with pt there will be an out-of-pocket cost for prep and that varies from $0 to 70 + dollars - pt verbalized understanding  ?Due to the COVID-19 pandemic we are asking patients to follow certain guidelines in PV and the [place]   ?Pt aware of COVID protocols and [facility] guidelines  ?PV completed over the phone. Pt verified name, DOB, address and insurance during PV today.  ?Pt mailed instruction packet with copy of consent form to read and not return, and instructions.  ?Pt encouraged to call with questions or issues.  ?If pt has My chart, procedure instructions sent via My Chart  ? ?

## 2021-10-24 ENCOUNTER — Encounter: Payer: Self-pay | Admitting: Gastroenterology

## 2021-11-01 ENCOUNTER — Encounter: Payer: BC Managed Care – PPO | Admitting: Gastroenterology

## 2021-11-02 ENCOUNTER — Ambulatory Visit (AMBULATORY_SURGERY_CENTER): Payer: BC Managed Care – PPO | Admitting: Gastroenterology

## 2021-11-02 ENCOUNTER — Encounter: Payer: Self-pay | Admitting: Gastroenterology

## 2021-11-02 VITALS — BP 128/71 | HR 62 | Temp 99.3°F | Resp 16 | Ht 68.0 in | Wt 287.0 lb

## 2021-11-02 DIAGNOSIS — Z1211 Encounter for screening for malignant neoplasm of colon: Secondary | ICD-10-CM | POA: Diagnosis present

## 2021-11-02 DIAGNOSIS — D120 Benign neoplasm of cecum: Secondary | ICD-10-CM

## 2021-11-02 DIAGNOSIS — K641 Second degree hemorrhoids: Secondary | ICD-10-CM

## 2021-11-02 MED ORDER — SODIUM CHLORIDE 0.9 % IV SOLN: 500.0000 mL | Freq: Once | INTRAVENOUS | Status: DC

## 2021-11-02 NOTE — Progress Notes (Signed)
Pt's states no medical or surgical changes since previsit or office visit. 

## 2021-11-02 NOTE — Op Note (Signed)
[facility] ?Patient Name: Rachel Warren ?Procedure Date: 11/02/2021 3:37 PM ?MRN: [id number] ?Endoscopist: Gerrit Heck , MD ?Age: 46 ?Referring MD:  ?Date of Birth: [birth year] ?Gender: Female ?Account #: [id number] ?Procedure:                Colonoscopy ?Indications:              Screening for colorectal malignant neoplasm, This  ?                          is the patient's first colonoscopy ?Medicines:                Monitored Anesthesia Care ?Procedure:                Pre-Anesthesia Assessment: ?                          - Prior to the procedure, a History and Physical  ?                          was performed, and patient medications and  ?                          allergies were reviewed. The patient's tolerance of  ?                          previous anesthesia was also reviewed. The risks  ?                          and benefits of the procedure and the sedation  ?                          options and risks were discussed with the patient.  ?                          All questions were answered, and informed consent  ?                          was obtained. Prior Anticoagulants: The patient has  ?                          taken no previous anticoagulant or antiplatelet  ?                          agents. ASA Grade Assessment: III - A patient with  ?                          severe systemic disease. After reviewing the risks  ?                          and benefits, the patient was deemed in  ?                          satisfactory condition to undergo the procedure. ?  After obtaining informed consent, the colonoscope  ?                          was passed under direct vision. Throughout the  ?                          procedure, the patient's blood pressure, pulse, and  ?                          oxygen saturations were monitored continuously. The  ?                          CF [id number] #[id number] was introduced through the anus  ?                          and advanced to the  the cecum, identified by  ?                          appendiceal orifice and ileocecal valve. The  ?                          colonoscopy was technically difficult and complex  ?                          due to significant looping. Successful completion  ?                          of the procedure was aided by changing the patient  ?                          to a supine position and using manual pressure, and  ?                          placement of an abdominal binder. The quality of  ?                          the bowel preparation was good. The ileocecal  ?                          valve, the appendiceal orifice and the rectum were  ?                          photographed. ?Scope In: 3:48:14 PM ?Scope Out: 4:27:06 PM ?Scope Withdrawal Time: 0 hours 15 minutes 46 seconds  ?Total Procedure Duration: 0 hours 38 minutes 52 seconds  ?Findings:                 Hemorrhoids were found on perianal exam. ?                          A 2 mm polyp was found in the cecum. The polyp was  ?                          sessile. The polyp  was removed with a cold biopsy  ?                          forceps. Resection and retrieval were complete.  ?                          Estimated blood loss was minimal. ?                          The sigmoid colon and ascending colon revealed  ?                          significantly excessive looping. Advancing the  ?                          scope required changing the patient to a supine  ?                          position, using manual pressure, and placement of  ?                          an abdominal binder. Unable to fully intubate the  ?                          cecum, with adequate visualization of approximately  ?                          50% of the cecal cap, including visualization of  ?                          the appendiceal orifice. Attempted to maneuver  ?                          cecal tissue with forcep to improve visualization.  ?                          After examining the  entire colon, the colonosope  ?                          was again reintroduced from the rectum with the  ?                          abdominal binder in place, using manual pressure,  ?                          and water insufflation. Again only able to  ?                          visualize half of the cecal cap. The colonoscope  ?                          was then removed with removal of the air and full  ?  desufflation. ?                          Non-bleeding internal hemorrhoids were found during  ?                          retroflexion. The hemorrhoids were medium-sized and  ?                          Grade III (internal hemorrhoids that prolapse but  ?                          require manual reduction). ?Complications:            No immediate complications. ?Estimated Blood Loss:     Estimated blood loss was minimal. ?Impression:               - Hemorrhoids found on perianal exam. ?                          - One 2 mm polyp in the cecum, removed with a cold  ?                          biopsy forceps. Resected and retrieved. ?                          - There was significant looping of the colon. ?                          - Non-bleeding internal hemorrhoids. ?Recommendation:           - Patient has a contact number available for  ?                          emergencies. The signs and symptoms of potential  ?                          delayed complications were discussed with the  ?                          patient. Return to normal activities tomorrow.  ?                          Written discharge instructions were provided to the  ?                          patient. ?                          - Resume previous diet. ?                          - Continue present medications. ?                          - Await pathology results. ?                          -  Could not fully visualize the cecal cap.  ?                          Recommend virtual colonoscopy for completion of  ?                           screening. ?Gerrit Heck, MD ?11/02/2021 4:37:58 PM ?

## 2021-11-02 NOTE — Progress Notes (Signed)
? ?GASTROENTEROLOGY PROCEDURE H&P NOTE  ? ?Primary Care Physician: ?Billie Ruddy, MD ? ? ? ?Reason for Procedure:  Colon Cancer screening ? ?Plan:    Colonoscopy ? ?Patient is appropriate for endoscopic procedure(s) in the ambulatory ([place]) setting. ? ?The nature of the procedure, as well as the risks, benefits, and alternatives were carefully and thoroughly reviewed with the patient. Ample time for discussion and questions allowed. The patient understood, was satisfied, and agreed to proceed.  ? ? ? ?HPI: ?Rachel Warren is a 46 y.o. female who presents for colonoscopy for routine Colon Cancer screening.  No active GI symptoms.  No known family history of colon cancer or related malignancy.  Patient is otherwise without complaints or active issues today. ? ?Past Medical History:  ?Diagnosis Date  ? Anemia   ? on meds  ? Arthritis   ? generalized  ? Back pain   ? lower back   ? Blood transfusion without reported diagnosis 2020  ? Complication of anesthesia   ? age 48 had twilight anesthesia and woke up during surgery   ? Depression   ? on meds  ? Fatty liver 01/22/2016  ? Noted on MRI abd  ? Fibroids   ? Heavy periods   ? History of gallstones   ? History of thyroid nodule   ? Left  ? Hypertension   ? on meds  ? Irregular periods   ? Obesity   ? Pancreatic lesion   ? Pinched nerve   ? in back/sciatica  ? Thyroid disease   ? has one thyroid gland - one removed  ? Ventral hernia 12/26/2014  ? Midline noted on CT Abd pelvis  ? ? ?Past Surgical History:  ?Procedure Laterality Date  ? benign breast cyst removed Left   ? age 27  ? CHOLECYSTECTOMY    ? INSERTION OF MESH N/A 02/27/2015  ? Procedure: INSERTION OF MESH;  Surgeon: Jackolyn Confer, MD;  Location: [facility];  Service: General;  Laterality: N/A;  ? IR ANGIOGRAM PELVIS SELECTIVE OR SUPRASELECTIVE  12/22/2018  ? IR ANGIOGRAM PELVIS SELECTIVE OR SUPRASELECTIVE  12/22/2018  ? IR ANGIOGRAM SELECTIVE EACH ADDITIONAL VESSEL  12/22/2018  ? IR ANGIOGRAM SELECTIVE EACH  ADDITIONAL VESSEL  12/22/2018  ? IR EMBO TUMOR ORGAN ISCHEMIA INFARCT INC GUIDE ROADMAPPING  12/22/2018  ? IR RADIOLOGIST EVAL & MGMT  09/17/2018  ? IR RADIOLOGIST EVAL & MGMT  01/05/2019  ? IR RADIOLOGIST EVAL & MGMT  10/06/2019  ? IR US GUIDE VASC ACCESS RIGHT  12/22/2018  ? LAPAROSCOPIC ASSISTED VENTRAL HERNIA REPAIR N/A 02/27/2015  ? Procedure: LAPAROSCOPIC VENTRAL HERNIA REPAIR WITH MESH;  Surgeon: Jackolyn Confer, MD;  Location: [facility];  Service: General;  Laterality: N/A;  ? THYROIDECTOMY, PARTIAL    ? TOTAL HIP ARTHROPLASTY Right 11/28/2017  ? Procedure: RIGHT TOTAL HIP ARTHROPLASTY ANTERIOR APPROACH;  Surgeon: Mcarthur Rossetti, MD;  Location: [facility];  Service: Orthopedics;  Laterality: Right;  ? TOTAL HIP ARTHROPLASTY Left 07/31/2018  ? Procedure: LEFT TOTAL HIP ARTHROPLASTY ANTERIOR APPROACH;  Surgeon: Mcarthur Rossetti, MD;  Location: [facility];  Service: Orthopedics;  Laterality: Left;  Failed Spinal  ? ? ?Prior to Admission medications   ?Medication Sig Start Date End Date Taking? Authorizing Provider  ?amLODipine (NORVASC) 10 MG tablet Take 1 tablet (10 mg total) by mouth daily. 06/04/21  Yes Billie Ruddy, MD  ?diclofenac sodium (VOLTAREN) 1 % GEL Apply 2 g topically 4 (four) times daily. ?Patient taking differently: Apply  2 g topically 4 (four) times daily as needed (pain). 11/19/18  Yes Mcarthur Rossetti, MD  ?FEROSUL 325 (65 Fe) MG tablet Take 1 tablet by mouth once daily 08/01/21  Yes Billie Ruddy, MD  ?gabapentin (NEURONTIN) 300 MG capsule TAKE 1 CAPSULE BY MOUTH THREE TIMES DAILY 09/20/21  Yes Billie Ruddy, MD  ?metoprolol succinate (TOPROL-XL) 50 MG 24 hr tablet TAKE 1 TABLET BY MOUTH ONCE DAILY. TAKE WITH OR IMMEDIATELY FOLLOWING A MEAL. 10/01/21  Yes Billie Ruddy, MD  ?norethindrone (AYGESTIN) 5 MG tablet Take 1 tablet (5 mg total) by mouth daily. 02/01/21  Yes Nunzio Cobbs, MD  ?Vitamin D, Ergocalciferol, (DRISDOL) 1.25 MG (50000 UNIT) CAPS capsule Take 1 capsule  (50,000 Units total) by mouth every 7 (seven) days. 09/22/20  Yes Billie Ruddy, MD  ?ALPRAZolam Duanne Moron) 0.25 MG tablet Take 1 tablet (0.25 mg total) by mouth as needed for anxiety (30 mins prior to flying). ?Patient not taking: Reported on 10/17/2021 09/26/21   Billie Ruddy, MD  ?DULoxetine (CYMBALTA) 30 MG capsule Take 1 capsule by mouth twice daily 09/20/21   Billie Ruddy, MD  ?naproxen sodium (ALEVE) 220 MG tablet Take 220 mg by mouth as needed (pain/headache).     [provider]  ?potassium chloride SA (KLOR-CON M) 20 MEQ tablet Take 2 tabs daily for 3 days. Then 1 tab daily ?Patient not taking: Reported on 11/02/2021 09/27/21   Billie Ruddy, MD  ? ? ?Current Outpatient Medications  ?Medication Sig Dispense Refill  ? amLODipine (NORVASC) 10 MG tablet Take 1 tablet (10 mg total) by mouth daily. 90 tablet 1  ? diclofenac sodium (VOLTAREN) 1 % GEL Apply 2 g topically 4 (four) times daily. (Patient taking differently: Apply 2 g topically 4 (four) times daily as needed (pain).) 100 g 3  ? FEROSUL 325 (65 Fe) MG tablet Take 1 tablet by mouth once daily 90 tablet 0  ? gabapentin (NEURONTIN) 300 MG capsule TAKE 1 CAPSULE BY MOUTH THREE TIMES DAILY 90 capsule 1  ? metoprolol succinate (TOPROL-XL) 50 MG 24 hr tablet TAKE 1 TABLET BY MOUTH ONCE DAILY. TAKE WITH OR IMMEDIATELY FOLLOWING A MEAL. 90 tablet 1  ? norethindrone (AYGESTIN) 5 MG tablet Take 1 tablet (5 mg total) by mouth daily. 90 tablet 3  ? Vitamin D, Ergocalciferol, (DRISDOL) 1.25 MG (50000 UNIT) CAPS capsule Take 1 capsule (50,000 Units total) by mouth every 7 (seven) days. 12 capsule 0  ? ALPRAZolam (XANAX) 0.25 MG tablet Take 1 tablet (0.25 mg total) by mouth as needed for anxiety (30 mins prior to flying). (Patient not taking: Reported on 10/17/2021) 10 tablet 0  ? DULoxetine (CYMBALTA) 30 MG capsule Take 1 capsule by mouth twice daily 180 capsule 1  ? naproxen sodium (ALEVE) 220 MG tablet Take 220 mg by mouth as needed (pain/headache).      ? potassium chloride SA (KLOR-CON M) 20 MEQ tablet Take 2 tabs daily for 3 days. Then 1 tab daily (Patient not taking: Reported on 11/02/2021) 33 tablet 0  ? ?Current Facility-Administered Medications  ?Medication Dose Route Frequency Provider Last Rate Last Admin  ? 0.9 %  sodium chloride infusion  500 mL Intravenous Once Arica Bevilacqua V, DO      ? ? ?Allergies as of 11/02/2021  ? (No Known Allergies)  ? ? ?Family History  ?Problem Relation Age of Onset  ? Hypertension Father   ? Arthritis Father   ? Clotting disorder  Father   ? Prostate cancer Paternal Grandfather   ? Colon polyps Neg Hx   ? Colon cancer Neg Hx   ? Esophageal cancer Neg Hx   ? Rectal cancer Neg Hx   ? Stomach cancer Neg Hx   ? ? ?Social History  ? ?Socioeconomic History  ? Marital status: Divorced  ?  Spouse name: Not on file  ? Number of children: Not on file  ? Years of education: Not on file  ? Highest education level: Not on file  ?Occupational History  ? Not on file  ?Tobacco Use  ? Smoking status: Former  ?  Types: Cigarettes  ?  Quit date: 06/14/2017  ?  Years since quitting: 4.3  ? Smokeless tobacco: Never  ?Vaping Use  ? Vaping Use: Never used  ?Substance and Sexual Activity  ? Alcohol use: Yes  ?  Alcohol/week: 4.0 standard drinks  ?  Types: 4 Standard drinks or equivalent per week  ? Drug use: Yes  ?  Frequency: 2.0 times per week  ?  Types: Marijuana  ? Sexual activity: Yes  ?  Birth control/protection: Pill  ?  Comment: continuous OCP  ?Other Topics Concern  ? Not on file  ?Social History Narrative  ? Not on file  ? ?Social Determinants of Health  ? ?Financial Resource Strain: Not on file  ?Food Insecurity: Not on file  ?Transportation Needs: Not on file  ?Physical Activity: Not on file  ?Stress: Not on file  ?Social Connections: Not on file  ?Intimate Partner Violence: Not on file  ? ? ?Physical Exam: ?Vital signs in last 24 hours: ?'@BP'$  (!) 141/73   Pulse 71   Temp 99.3 ?F (37.4 ?C)   Ht '5\' 8"'$  (1.727 m)   Wt 287 lb (130.2 kg)    LMP  (LMP Unknown)   SpO2 99%   BMI 43.64 kg/m?  ?GEN: NAD ?EYE: Sclerae anicteric ?ENT: MMM ?CV: Non-tachycardic ?Pulm: CTA b/l ?GI: Soft, NT/ND ?NEURO:  Alert & Oriented x 3 ? ? ?Gerrit Heck, DO ?[facility]

## 2021-11-02 NOTE — Progress Notes (Signed)
PT taken to PACU. Monitors in place. VSS. Report given to RN. 

## 2021-11-02 NOTE — Progress Notes (Signed)
Called to room to assist during endoscopic procedure.  Patient ID and intended procedure confirmed with present staff. Received instructions for my participation in the procedure from the performing physician.  

## 2021-11-02 NOTE — Patient Instructions (Signed)
YOU HAD AN ENDOSCOPIC PROCEDURE TODAY AT THE [facility]:   Refer to the procedure report that was given to you for any specific questions about what was found during the examination.  If the procedure report does not answer your questions, please call your gastroenterologist to clarify.  If you requested that your care partner not be given the details of your procedure findings, then the procedure report has been included in a sealed envelope for you to review at your convenience later.  YOU SHOULD EXPECT: Some feelings of bloating in the abdomen. Passage of more gas than usual.  Walking can help get rid of the air that was put into your GI tract during the procedure and reduce the bloating. If you had a lower endoscopy (such as a colonoscopy or flexible sigmoidoscopy) you may notice spotting of blood in your stool or on the toilet paper. If you underwent a bowel prep for your procedure, you may not have a normal bowel movement for a few days.  Please Note:  You might notice some irritation and congestion in your nose or some drainage.  This is from the oxygen used during your procedure.  There is no need for concern and it should clear up in a day or so.  SYMPTOMS TO REPORT IMMEDIATELY:   Following lower endoscopy (colonoscopy or flexible sigmoidoscopy):  Excessive amounts of blood in the stool  Significant tenderness or worsening of abdominal pains  Swelling of the abdomen that is new, acute  Fever of 100F or higher   Following upper endoscopy (EGD)  Vomiting of blood or coffee ground material  New chest pain or pain under the shoulder blades  Painful or persistently difficult swallowing  New shortness of breath  Fever of 100F or higher  Black, tarry-looking stools  For urgent or emergent issues, a gastroenterologist can be reached at any hour by calling [phone]. Do not use MyChart messaging for urgent concerns.    DIET:  We do recommend a small meal at first, but  then you may proceed to your regular diet.  Drink plenty of fluids but you should avoid alcoholic beverages for 24 hours.  ACTIVITY:  You should plan to take it easy for the rest of today and you should NOT DRIVE or use heavy machinery until tomorrow (because of the sedation medicines used during the test).    FOLLOW UP: Our staff will call the number listed on your records 48-72 hours following your procedure to check on you and address any questions or concerns that you may have regarding the information given to you following your procedure. If we do not reach you, we will leave a message.  We will attempt to reach you two times.  During this call, we will ask if you have developed any symptoms of COVID 19. If you develop any symptoms (ie: fever, flu-like symptoms, shortness of breath, cough etc.) before then, please call [phone].  If you test positive for Covid 19 in the 2 weeks post procedure, please call and report this information to us.    If any biopsies were taken you will be contacted by phone or by letter within the next 1-3 weeks.  Please call us at [phone] if you have not heard about the biopsies in 3 weeks.    SIGNATURES/CONFIDENTIALITY: You and/or your care partner have signed paperwork which will be entered into your electronic medical record.  These signatures attest to the fact that that the information above on   your After Visit Summary has been reviewed and is understood.  Full responsibility of the confidentiality of this discharge information lies with you and/or your care-partner. 

## 2021-11-06 ENCOUNTER — Telehealth: Payer: Self-pay

## 2021-11-06 ENCOUNTER — Other Ambulatory Visit: Payer: Self-pay

## 2021-11-06 DIAGNOSIS — Z1211 Encounter for screening for malignant neoplasm of colon: Secondary | ICD-10-CM

## 2021-11-06 NOTE — Telephone Encounter (Signed)
Pt is scheduled for virtual colonoscopy on 12/24/21 at 8 am as requested on 4/21 colonoscopy report. Let pt know she needs to be there at 7:40 am on 6/12 and she will need to pick up prep 4 days before virtual colonoscopy. Pt verbalized understanding and had no other concerns at end of call.  ?

## 2021-11-06 NOTE — Telephone Encounter (Signed)
?  Follow up Call- ? ? ?  11/02/2021  ?  3:16 PM  ?Call back number  ?Post procedure Call Back phone  # [phone]  ?Permission to leave phone message Yes  ?  ? ?Patient questions: ? ?Do you have a fever, pain , or abdominal swelling? No. ?Pain Score  0 * ? ?Have you tolerated food without any problems? Yes.   ? ?Have you been able to return to your normal activities? Yes.   ? ?Do you have any questions about your discharge instructions: ?Diet   No. ?Medications  No. ?Follow up visit  No. ? ?Do you have questions or concerns about your Care? No. ? ?Actions: ?* If pain score is 4 or above: ?No action needed, pain <4. ? ? ?

## 2021-11-09 ENCOUNTER — Ambulatory Visit: Payer: BC Managed Care – PPO | Admitting: Pulmonary Disease

## 2021-11-09 ENCOUNTER — Other Ambulatory Visit: Payer: Self-pay | Admitting: Pulmonary Disease

## 2021-11-09 ENCOUNTER — Encounter: Payer: Self-pay | Admitting: Pulmonary Disease

## 2021-11-09 VITALS — BP 130/80 | HR 72 | Temp 98.6°F | Ht 68.0 in | Wt 289.4 lb

## 2021-11-09 DIAGNOSIS — R0602 Shortness of breath: Secondary | ICD-10-CM

## 2021-11-09 MED ORDER — STIOLTO RESPIMAT 2.5-2.5 MCG/ACT IN AERS: 2.0000 | INHALATION_SPRAY | Freq: Every day | RESPIRATORY_TRACT | 3 refills | Status: DC

## 2021-11-09 NOTE — Patient Instructions (Addendum)
Nice to meet you  ? ?Let's try stiolto 2 puffs once a day. Please let me know if too expensive. ? ?We can consider other inhalers, chest images in the future if we are not making progress. ? ?Return 2 months or sooner as needed ?

## 2021-11-14 ENCOUNTER — Encounter: Payer: Self-pay | Admitting: Gastroenterology

## 2021-11-16 NOTE — Progress Notes (Signed)
$'@Patient'V$  ID: Rachel Warren, female    DOB: [birth year], 46 y.o.   MRN: [id number]  Chief Complaint  Patient presents with   Consult    Pt is here for consult. Pt states she feels like sometimes her body is forcing her to take another breath when she is talking. Pt states this has been occurring for 5 months. Pt states she did have covid right before this occurred. Pt is not on any inhalers at the moment.     Referring provider: Billie Ruddy, MD  HPI:   46 y.o. woman whom we have seen in consultation for evaluation of intermittent shortness of breath.  Note from referring provider reviewed.  Most recent gastroenterology note reviewed.  Patient describes intermittent sensation of difficulty getting deep breath.  Almost like a tightness.  Cannot get air in.  Not that she is distressed but has a sensation that she cannot get a deep breath.  Sensation does not last long.  Eventually she is able to get a deeper breath.  These seem to happen more at work than elsewhere.  But no clear pattern.  No time of day when things are better or worse.  No position make things better or worse.  No other relieving or exacerbating factors can be identified.  She does endorse weight gain over the last couple of years.  She denies any history of asthma.  No atopic symptoms.  No true dyspnea on exertion.  PMH: Hypertension, anxiety Surgical history: Cholecystectomy, hernia repair, thyroidectomy, hip replacement Family history: Father with hypertension, arthritis, reported clotting disorder Social history: Former smoker, quit 2019, Pharmacist, hospital, lives in [place] / Pulmonary Flowsheets:   ACT:      View : No data to display.          MMRC:     View : No data to display.          Epworth:      View : No data to display.          Tests:   FENO:  No results found for: NITRICOXIDE  PFT:     View : No data to display.          WALK:      View : No data to  display.          Imaging: No results found.  Lab Results: Personally reviewed CBC    Component Value Date/Time   WBC 6.7 09/26/2021 1040   RBC 5.10 09/26/2021 1040   HGB 16.2 (H) 09/26/2021 1040   HGB 12.5 09/10/2018 1014   HCT 47.2 (H) 09/26/2021 1040   HCT 39.8 09/10/2018 1014   PLT 207.0 09/26/2021 1040   PLT 329 09/10/2018 1014   MCV 92.6 09/26/2021 1040   MCV 85 09/10/2018 1014   MCH 30.1 02/23/2019 0946   MCHC 34.2 09/26/2021 1040   RDW 13.8 09/26/2021 1040   RDW 15.2 09/10/2018 1014   LYMPHSABS 1.7 09/26/2021 1040   MONOABS 0.4 09/26/2021 1040   EOSABS 0.0 09/26/2021 1040   BASOSABS 0.0 09/26/2021 1040    BMET    Component Value Date/Time   NA 145 09/26/2021 1040   K 3.1 (L) 09/26/2021 1040   CL 104 09/26/2021 1040   CO2 30 09/26/2021 1040   GLUCOSE 100 (H) 09/26/2021 1040   BUN 7 09/26/2021 1040   CREATININE 0.92 09/26/2021 1040   CREATININE 0.82 03/12/2019 1536   CALCIUM 9.2 09/26/2021 1040   GFRNONAA >  60 02/23/2019 0946   GFRAA >60 02/23/2019 0946    BNP No results found for: BNP  ProBNP No results found for: PROBNP  Specialty Problems   None   No Known Allergies  Immunization History  Administered Date(s) Administered   Influenza,inj,Quad PF,6+ Mos 04/07/2019, 09/26/2021   Influenza-Unspecified 04/12/2018, 04/07/2019, 03/31/2020   PFIZER(Purple Top)SARS-COV-2 Vaccination 09/24/2019, 10/15/2019   Tdap 01/14/2018    Past Medical History:  Diagnosis Date   Anemia    on meds   Arthritis    generalized   Back pain    lower back    Blood transfusion without reported diagnosis 2595   Complication of anesthesia    age 10 had twilight anesthesia and woke up during surgery    Depression    on meds   Fatty liver 01/22/2016   Noted on MRI abd   Fibroids    Heavy periods    History of gallstones    History of thyroid nodule    Left   Hypertension    on meds   Irregular periods    Obesity    Pancreatic lesion    Pinched  nerve    in back/sciatica   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   Ventral hernia 12/26/2014   Midline noted on CT Abd pelvis    Tobacco History: Social History   Tobacco Use  Smoking Status Former   Types: Cigarettes   Quit date: 06/14/2017   Years since quitting: 4.4  Smokeless Tobacco Never  Tobacco Comments   Pt states she does vape and does smoke occasionally    Counseling given: Not Answered Tobacco comments: Pt states she does vape and does smoke occasionally    Continue to not smoke  Outpatient Encounter Medications as of 11/09/2021  Medication Sig   ALPRAZolam (XANAX) 0.25 MG tablet Take 1 tablet (0.25 mg total) by mouth as needed for anxiety (30 mins prior to flying).   amLODipine (NORVASC) 10 MG tablet Take 1 tablet (10 mg total) by mouth daily.   diclofenac sodium (VOLTAREN) 1 % GEL Apply 2 g topically 4 (four) times daily. (Patient taking differently: Apply 2 g topically 4 (four) times daily as needed (pain).)   DULoxetine (CYMBALTA) 30 MG capsule Take 1 capsule by mouth twice daily   FEROSUL 325 (65 Fe) MG tablet Take 1 tablet by mouth once daily   gabapentin (NEURONTIN) 300 MG capsule TAKE 1 CAPSULE BY MOUTH THREE TIMES DAILY   metoprolol succinate (TOPROL-XL) 50 MG 24 hr tablet TAKE 1 TABLET BY MOUTH ONCE DAILY. TAKE WITH OR IMMEDIATELY FOLLOWING A MEAL.   naproxen sodium (ALEVE) 220 MG tablet Take 220 mg by mouth as needed (pain/headache).    norethindrone (AYGESTIN) 5 MG tablet Take 1 tablet (5 mg total) by mouth daily.   Vitamin D, Ergocalciferol, (DRISDOL) 1.25 MG (50000 UNIT) CAPS capsule Take 1 capsule (50,000 Units total) by mouth every 7 (seven) days.   [DISCONTINUED] Tiotropium Bromide-Olodaterol (STIOLTO RESPIMAT) 2.5-2.5 MCG/ACT AERS Inhale 2 puffs into the lungs daily.   [DISCONTINUED] potassium chloride SA (KLOR-CON M) 20 MEQ tablet Take 2 tabs daily for 3 days. Then 1 tab daily (Patient not taking: Reported on 11/02/2021)   No  facility-administered encounter medications on file as of 11/09/2021.     Review of Systems  Review of Systems  No chest pain with exertion.  No orthopnea or PND.  Comprehensive review of systems otherwise negative. Physical Exam  BP 130/80 (  BP Location: Right Arm, Patient Position: Sitting, Cuff Size: Normal)   Pulse 72   Temp 98.6 F (37 C) (Oral)   Ht '5\' 8"'$  (1.727 m)   Wt 289 lb 6.4 oz (131.3 kg)   LMP  (LMP Unknown)   SpO2 98%   BMI 44.00 kg/m   Wt Readings from Last 5 Encounters:  11/09/21 289 lb 6.4 oz (131.3 kg)  11/02/21 287 lb (130.2 kg)  10/17/21 287 lb (130.2 kg)  09/26/21 287 lb 6.4 oz (130.4 kg)  05/09/21 294 lb (133.4 kg)    BMI Readings from Last 5 Encounters:  11/09/21 44.00 kg/m  11/02/21 43.64 kg/m  10/17/21 43.64 kg/m  09/26/21 43.70 kg/m  05/09/21 44.05 kg/m     Physical Exam General: Well-appearing, no acute distress Eyes: EOMI, no icterus Neck: Supple, no JVP Pulmonary: Clear, normal work of breathing Cardiovascular: Regular rate and rhythm, no murmur Abdomen: Nondistended, bowel sounds present MSK: No synovitis, no joint effusion Neuro: Normal gait, no weakness Psych: Normal mood, full affect   Assessment & Plan:   Sensation of needing to take extra breaths, gasping for air: Intermittent, not all the time, seems a bit worse when at work.  Suspect this is physiologic and normal, suspect normal response to intermittent atelectasis.  Weight gain recently likely contributing.  Possible reactive airway disease given symptoms seem a bit worse at work.  Chest x-ray are reportedly clear past although I cannot review this.  We will trial Stiolto.  Not very optimistic this will help.  Consider additional chest imaging in the future if this continues.   Return in about 2 months (around 01/09/2022).   Lanier Clam, MD 11/16/2021

## 2021-11-20 ENCOUNTER — Other Ambulatory Visit: Payer: Self-pay | Admitting: Family Medicine

## 2021-11-20 DIAGNOSIS — D509 Iron deficiency anemia, unspecified: Secondary | ICD-10-CM

## 2021-11-23 ENCOUNTER — Encounter: Payer: BC Managed Care – PPO | Admitting: Gastroenterology

## 2021-12-13 ENCOUNTER — Ambulatory Visit: Payer: BC Managed Care – PPO

## 2021-12-14 ENCOUNTER — Other Ambulatory Visit: Payer: Self-pay | Admitting: Family Medicine

## 2021-12-14 DIAGNOSIS — I10 Essential (primary) hypertension: Secondary | ICD-10-CM

## 2021-12-24 ENCOUNTER — Inpatient Hospital Stay: Admission: RE | Admit: 2021-12-24 | Payer: BC Managed Care – PPO | Source: Ambulatory Visit

## 2022-01-04 ENCOUNTER — Ambulatory Visit
Admission: RE | Admit: 2022-01-04 | Discharge: 2022-01-04 | Disposition: A | Discharge status: Home / Self Care | Payer: BC Managed Care – PPO | Source: Ambulatory Visit | Attending: Gastroenterology | Admitting: Gastroenterology

## 2022-01-04 DIAGNOSIS — Z1211 Encounter for screening for malignant neoplasm of colon: Secondary | ICD-10-CM

## 2022-01-06 ENCOUNTER — Other Ambulatory Visit: Payer: Self-pay | Admitting: Family Medicine

## 2022-01-20 ENCOUNTER — Other Ambulatory Visit: Payer: Self-pay | Admitting: Obstetrics and Gynecology

## 2022-01-21 NOTE — Telephone Encounter (Signed)
Last annual exam 01/2020 Not scheduled for future exam Last mammogram 03/2021

## 2022-01-29 ENCOUNTER — Other Ambulatory Visit: Payer: Self-pay | Admitting: Obstetrics and Gynecology

## 2022-01-29 NOTE — Telephone Encounter (Signed)
Last annual exam was 01/2021 No exam scheduled yet.

## 2022-01-30 ENCOUNTER — Other Ambulatory Visit: Payer: Self-pay | Admitting: Family Medicine

## 2022-02-04 ENCOUNTER — Other Ambulatory Visit: Payer: Self-pay | Admitting: Obstetrics and Gynecology

## 2022-02-04 NOTE — Telephone Encounter (Signed)
Last exam 01/2021 No exam scheduled

## 2022-02-05 ENCOUNTER — Ambulatory Visit: Payer: BC Managed Care – PPO | Admitting: Obstetrics and Gynecology

## 2022-02-08 ENCOUNTER — Ambulatory Visit: Payer: BC Managed Care – PPO | Admitting: Pulmonary Disease

## 2022-02-08 ENCOUNTER — Encounter: Payer: Self-pay | Admitting: Pulmonary Disease

## 2022-02-08 VITALS — HR 83 | Ht 67.0 in | Wt 288.0 lb

## 2022-02-08 DIAGNOSIS — J310 Chronic rhinitis: Secondary | ICD-10-CM

## 2022-02-08 DIAGNOSIS — R0689 Other abnormalities of breathing: Secondary | ICD-10-CM | POA: Diagnosis not present

## 2022-02-08 NOTE — Patient Instructions (Addendum)
Nice to see you again  Let's stop the Stiolto - if the episodes seem to be more frequent or more severe just start taking it again   Try an over the counter anti-histamine at night (Zyrtec, allegra, claritin)  Return to clinic in 3 months or sooner as needed

## 2022-02-08 NOTE — Progress Notes (Signed)
$'@Patient'N$  ID: Rachel Warren, female    DOB: 10/17/1975, 46 y.o.   MRN: [id number]  Chief Complaint  Patient presents with   Follow-up    Follow-up     Referring provider: Billie Ruddy, MD  HPI:   46 y.o. woman whom we have seen in follow up for evaluation of intermittent shortness of breath.    Returns for routine follow up of sensation of difficulty taking deep breaths. Sounded like normal physiologic response to possible atelectasis but was prescribed Stiolto in case asthma or bronchospasm was contributing. She is unsure if Stiolto has helped. She sates it is less noticeable, less bothersome overall. Episodes still occur. Wondering if related to stress while working. Discussed could be environmental trigger in classroom as well.   HPI at initial visit: Patient describes intermittent sensation of difficulty getting deep breath.  Almost like a tightness.  Cannot get air in.  Not that she is distressed but has a sensation that she cannot get a deep breath.  Sensation does not last long.  Eventually she is able to get a deeper breath.  These seem to happen more at work than elsewhere.  But no clear pattern.  No time of day when things are better or worse.  No position make things better or worse.  No other relieving or exacerbating factors can be identified.  She does endorse weight gain over the last couple of years.  She denies any history of asthma.  No atopic symptoms.  No true dyspnea on exertion.  PMH: Hypertension, anxiety Surgical history: Cholecystectomy, hernia repair, thyroidectomy, hip replacement Family history: Father with hypertension, arthritis, reported clotting disorder Social history: Former smoker, quit 2019, Pharmacist, hospital, lives in [place] / Pulmonary Flowsheets:   ACT:      No data to display          MMRC:     No data to display          Epworth:      No data to display          Tests:   FENO:  No results found  for: "NITRICOXIDE"  PFT:     No data to display          WALK:      No data to display          Imaging: No results found.  Lab Results: Personally reviewed CBC    Component Value Date/Time   WBC 6.7 09/26/2021 1040   RBC 5.10 09/26/2021 1040   HGB 16.2 (H) 09/26/2021 1040   HGB 12.5 09/10/2018 1014   HCT 47.2 (H) 09/26/2021 1040   HCT 39.8 09/10/2018 1014   PLT 207.0 09/26/2021 1040   PLT 329 09/10/2018 1014   MCV 92.6 09/26/2021 1040   MCV 85 09/10/2018 1014   MCH 30.1 02/23/2019 0946   MCHC 34.2 09/26/2021 1040   RDW 13.8 09/26/2021 1040   RDW 15.2 09/10/2018 1014   LYMPHSABS 1.7 09/26/2021 1040   MONOABS 0.4 09/26/2021 1040   EOSABS 0.0 09/26/2021 1040   BASOSABS 0.0 09/26/2021 1040    BMET    Component Value Date/Time   NA 145 09/26/2021 1040   K 3.1 (L) 09/26/2021 1040   CL 104 09/26/2021 1040   CO2 30 09/26/2021 1040   GLUCOSE 100 (H) 09/26/2021 1040   BUN 7 09/26/2021 1040   CREATININE 0.92 09/26/2021 1040   CREATININE 0.82 03/12/2019 1536   CALCIUM 9.2 09/26/2021 1040  GFRNONAA >60 02/23/2019 0946   GFRAA >60 02/23/2019 0946    BNP No results found for: "BNP"  ProBNP No results found for: "PROBNP"  Specialty Problems   None   No Known Allergies  Immunization History  Administered Date(s) Administered   Influenza,inj,Quad PF,6+ Mos 04/07/2019, 09/26/2021   Influenza-Unspecified 04/12/2018, 04/07/2019, 03/31/2020   PFIZER(Purple Top)SARS-COV-2 Vaccination 09/24/2019, 10/15/2019   Tdap 01/14/2018    Past Medical History:  Diagnosis Date   Anemia    on meds   Arthritis    generalized   Back pain    lower back    Blood transfusion without reported diagnosis 6269   Complication of anesthesia    age 28 had twilight anesthesia and woke up during surgery    Depression    on meds   Fatty liver 01/22/2016   Noted on MRI abd   Fibroids    Heavy periods    History of gallstones    History of thyroid nodule    Left    Hypertension    on meds   Irregular periods    Obesity    Pancreatic lesion    Pinched nerve    in back/sciatica   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   Ventral hernia 12/26/2014   Midline noted on CT Abd pelvis    Tobacco History: Social History   Tobacco Use  Smoking Status Some Days   Types: Cigarettes   Last attempt to quit: 06/14/2017   Years since quitting: 4.6  Smokeless Tobacco Current  Tobacco Comments   Pt states she does vape and does smoke occasionally Tay 02/08/22   Ready to quit: Not Answered Counseling given: Not Answered Tobacco comments: Pt states she does vape and does smoke occasionally Tay 02/08/22   Continue to not smoke  Outpatient Encounter Medications as of 02/08/2022  Medication Sig   STIOLTO RESPIMAT 2.5-2.5 MCG/ACT AERS INHALE 2 PUFFS INTO THE LUNGS ONCE DAILY   ALPRAZolam (XANAX) 0.25 MG tablet Take 1 tablet (0.25 mg total) by mouth as needed for anxiety (30 mins prior to flying).   amLODipine (NORVASC) 10 MG tablet Take 1 tablet by mouth once daily   diclofenac sodium (VOLTAREN) 1 % GEL Apply 2 g topically 4 (four) times daily. (Patient taking differently: Apply 2 g topically 4 (four) times daily as needed (pain).)   DULoxetine (CYMBALTA) 30 MG capsule Take 1 capsule by mouth twice daily   ferrous sulfate (FEROSUL) 325 (65 FE) MG tablet Take 1 tablet by mouth once daily   gabapentin (NEURONTIN) 300 MG capsule TAKE 1 CAPSULE BY MOUTH THREE TIMES DAILY   metoprolol succinate (TOPROL-XL) 50 MG 24 hr tablet TAKE 1 TABLET BY MOUTH ONCE DAILY. TAKE WITH OR IMMEDIATELY FOLLOWING A MEAL.   naproxen sodium (ALEVE) 220 MG tablet Take 220 mg by mouth as needed (pain/headache).    norethindrone (AYGESTIN) 5 MG tablet Take 1 tablet (5 mg total) by mouth daily.   Vitamin D, Ergocalciferol, (DRISDOL) 1.25 MG (50000 UNIT) CAPS capsule Take 1 capsule (50,000 Units total) by mouth every 7 (seven) days.   No facility-administered encounter  medications on file as of 02/08/2022.     Review of Systems  Review of Systems  N/a Physical Exam  Pulse 83   Ht '5\' 7"'$  (1.702 m)   Wt 288 lb (130.6 kg)   SpO2 96%   BMI 45.11 kg/m   Wt Readings from Last 5 Encounters:  02/08/22 288 lb (  130.6 kg)  11/09/21 289 lb 6.4 oz (131.3 kg)  11/02/21 287 lb (130.2 kg)  10/17/21 287 lb (130.2 kg)  09/26/21 287 lb 6.4 oz (130.4 kg)    BMI Readings from Last 5 Encounters:  02/08/22 45.11 kg/m  11/09/21 44.00 kg/m  11/02/21 43.64 kg/m  10/17/21 43.64 kg/m  09/26/21 43.70 kg/m     Physical Exam General: Well-appearing, no acute distress Eyes: EOMI, no icterus Neck: Supple, no JVP Pulmonary: Clear, normal work of breathing Cardiovascular: Regular rate and rhythm, no murmur Abdomen: Nondistended, bowel sounds present MSK: No synovitis, no joint effusion Neuro: Normal gait, no weakness Psych: Normal mood, full affect   Assessment & Plan:   Sensation of needing to take extra breaths, gasping for air: Intermittent, not all the time, seems a bit worse when at work.  Suspect this is physiologic and normal, suspect normal response to intermittent atelectasis.  Weight gain recently likely contributing.  Possible reactive airway disease given symptoms seem a bit worse at work.  Chest x-ray are reportedly clear past although I cannot review this.  Stiolto without marked improvement although she admits episodes seem less noticeable. Ok to stop Stiolto and instructed to resume if episodes more severe or frequent.  Nasals congestion: allergic vs non allergic rhinitis. Worse outdoors leaning towards allergic. Advised to take OTC anti-histamine at night and assess response. Consider intranasal steroid in future.    Return in about 3 months (around 05/11/2022).   Lanier Clam, MD 02/08/2022

## 2022-02-11 ENCOUNTER — Other Ambulatory Visit: Payer: Self-pay | Admitting: Family Medicine

## 2022-02-11 DIAGNOSIS — D509 Iron deficiency anemia, unspecified: Secondary | ICD-10-CM

## 2022-02-26 ENCOUNTER — Other Ambulatory Visit: Payer: Self-pay | Admitting: Obstetrics and Gynecology

## 2022-03-02 ENCOUNTER — Other Ambulatory Visit: Payer: Self-pay | Admitting: Family Medicine

## 2022-03-05 ENCOUNTER — Other Ambulatory Visit: Payer: Self-pay | Admitting: Family Medicine

## 2022-03-05 DIAGNOSIS — I10 Essential (primary) hypertension: Secondary | ICD-10-CM

## 2022-03-10 ENCOUNTER — Other Ambulatory Visit: Payer: Self-pay | Admitting: Family Medicine

## 2022-03-10 DIAGNOSIS — I10 Essential (primary) hypertension: Secondary | ICD-10-CM

## 2022-03-11 ENCOUNTER — Other Ambulatory Visit: Payer: Self-pay | Admitting: Family Medicine

## 2022-03-11 DIAGNOSIS — G8929 Other chronic pain: Secondary | ICD-10-CM

## 2022-03-14 ENCOUNTER — Encounter: Payer: Self-pay | Admitting: Orthopaedic Surgery

## 2022-04-10 ENCOUNTER — Ambulatory Visit (INDEPENDENT_AMBULATORY_CARE_PROVIDER_SITE_OTHER): Payer: BC Managed Care – PPO

## 2022-04-10 DIAGNOSIS — Z23 Encounter for immunization: Secondary | ICD-10-CM | POA: Diagnosis not present

## 2022-04-26 ENCOUNTER — Ambulatory Visit: Payer: BC Managed Care – PPO | Admitting: Pulmonary Disease

## 2022-05-23 ENCOUNTER — Other Ambulatory Visit: Payer: Self-pay | Admitting: Family Medicine

## 2022-05-23 DIAGNOSIS — D509 Iron deficiency anemia, unspecified: Secondary | ICD-10-CM

## 2022-06-08 ENCOUNTER — Other Ambulatory Visit: Payer: Self-pay | Admitting: Family Medicine

## 2022-06-08 DIAGNOSIS — G8929 Other chronic pain: Secondary | ICD-10-CM

## 2022-06-28 ENCOUNTER — Other Ambulatory Visit: Payer: Self-pay | Admitting: Family Medicine

## 2022-06-28 DIAGNOSIS — I10 Essential (primary) hypertension: Secondary | ICD-10-CM

## 2022-06-28 MED ORDER — METOPROLOL SUCCINATE ER 50 MG PO TB24: 50.0000 mg | ORAL_TABLET | Freq: Every day | ORAL | 0 refills | Status: DC

## 2022-07-02 ENCOUNTER — Encounter (INDEPENDENT_AMBULATORY_CARE_PROVIDER_SITE_OTHER): Payer: BC Managed Care – PPO | Admitting: Family Medicine

## 2022-07-17 ENCOUNTER — Encounter (INDEPENDENT_AMBULATORY_CARE_PROVIDER_SITE_OTHER): Payer: BC Managed Care – PPO | Admitting: Family Medicine

## 2022-08-22 ENCOUNTER — Other Ambulatory Visit: Payer: Self-pay | Admitting: Family Medicine

## 2022-08-22 DIAGNOSIS — D509 Iron deficiency anemia, unspecified: Secondary | ICD-10-CM

## 2022-09-15 ENCOUNTER — Other Ambulatory Visit: Payer: Self-pay | Admitting: Family Medicine

## 2022-09-15 DIAGNOSIS — I10 Essential (primary) hypertension: Secondary | ICD-10-CM

## 2022-09-21 ENCOUNTER — Other Ambulatory Visit: Payer: Self-pay | Admitting: Family Medicine

## 2022-09-21 DIAGNOSIS — I10 Essential (primary) hypertension: Secondary | ICD-10-CM

## 2022-09-30 ENCOUNTER — Encounter: Payer: BC Managed Care – PPO | Admitting: Family Medicine

## 2022-10-03 ENCOUNTER — Ambulatory Visit (INDEPENDENT_AMBULATORY_CARE_PROVIDER_SITE_OTHER): Payer: BC Managed Care – PPO | Admitting: Family Medicine

## 2022-10-03 ENCOUNTER — Encounter: Payer: Self-pay | Admitting: Family Medicine

## 2022-10-03 VITALS — BP 126/76 | HR 85 | Temp 98.8°F | Ht 68.0 in | Wt 288.8 lb

## 2022-10-03 DIAGNOSIS — M1712 Unilateral primary osteoarthritis, left knee: Secondary | ICD-10-CM

## 2022-10-03 DIAGNOSIS — M25562 Pain in left knee: Secondary | ICD-10-CM

## 2022-10-03 DIAGNOSIS — E049 Nontoxic goiter, unspecified: Secondary | ICD-10-CM

## 2022-10-03 DIAGNOSIS — F40243 Fear of flying: Secondary | ICD-10-CM

## 2022-10-03 DIAGNOSIS — Z0000 Encounter for general adult medical examination without abnormal findings: Secondary | ICD-10-CM | POA: Diagnosis not present

## 2022-10-03 DIAGNOSIS — E559 Vitamin D deficiency, unspecified: Secondary | ICD-10-CM | POA: Diagnosis not present

## 2022-10-03 DIAGNOSIS — J302 Other seasonal allergic rhinitis: Secondary | ICD-10-CM | POA: Diagnosis not present

## 2022-10-03 DIAGNOSIS — E7841 Elevated Lipoprotein(a): Secondary | ICD-10-CM

## 2022-10-03 DIAGNOSIS — I10 Essential (primary) hypertension: Secondary | ICD-10-CM | POA: Diagnosis not present

## 2022-10-03 DIAGNOSIS — G8929 Other chronic pain: Secondary | ICD-10-CM

## 2022-10-03 MED ORDER — METOPROLOL SUCCINATE ER 50 MG PO TB24: ORAL_TABLET | ORAL | 3 refills | Status: DC

## 2022-10-03 MED ORDER — ALPRAZOLAM 0.25 MG PO TABS: 0.2500 mg | ORAL_TABLET | ORAL | 0 refills | Status: DC | PRN

## 2022-10-03 MED ORDER — GABAPENTIN 300 MG PO CAPS: 300.0000 mg | ORAL_CAPSULE | Freq: Three times a day (TID) | ORAL | 3 refills | Status: AC

## 2022-10-03 MED ORDER — DULOXETINE HCL 30 MG PO CPEP: 30.0000 mg | ORAL_CAPSULE | Freq: Two times a day (BID) | ORAL | 3 refills | Status: DC

## 2022-10-03 MED ORDER — AMLODIPINE BESYLATE 10 MG PO TABS: 10.0000 mg | ORAL_TABLET | Freq: Every day | ORAL | 3 refills | Status: DC

## 2022-10-03 NOTE — Progress Notes (Signed)
Established Patient Office Visit   Subjective  Patient ID: Rachel Warren, female    DOB: September 13, 1975  Age: 47 y.o. MRN: [id number]  Chief Complaint  Patient presents with   Annual Exam    Pt is a 47 yo female with pmh sig for HTN, obesity, anxiety with flying, vit D def, OA who was seen for CPE.  Pt needs handicap placard renewed.  Still having L knee pain and occasional b/l hip pain.  Had steroid injections x 2 with Ortho, without relief.  Was told knee was not bad enough yet for TKR.  Pt taking 2 aleeve each am and by the end of the day is in severe pain.  Trying to manage symptoms.  Uses cane to aid with ambulation.  Pt has several trips planned.  Going to [place] this wknd, [place], and on a cruise in July.  Pt does not like flying as it causes anxiety.  Typically takes xanax.  Requesting refill.  Blood pressure well controlled on current meds.  Pt states she is getting over a cold from one of the kids in her class.  Patient also notes watery, itchy, eyes and postnasal drainage when going outside.  Symptoms improved with Claritin.  Does not take regularly.      ROS Negative unless stated above    Objective:     BP 126/76 (BP Location: Left Arm, Patient Position: Sitting, Cuff Size: Large)   Pulse 85   Temp 98.8 F (37.1 C) (Oral)   Ht 5\' 8"  (1.727 m)   Wt 288 lb 12.8 oz (131 kg)   SpO2 99%   BMI 43.91 kg/m    Physical Exam Constitutional:      Appearance: Normal appearance.  HENT:     Head: Normocephalic and atraumatic.     Right Ear: Tympanic membrane, ear canal and external ear normal.     Left Ear: Tympanic membrane, ear canal and external ear normal.     Nose: Nose normal.     Mouth/Throat:     Mouth: Mucous membranes are moist.     Pharynx: No oropharyngeal exudate or posterior oropharyngeal erythema.  Eyes:     General: No scleral icterus.    Extraocular Movements: Extraocular movements intact.     Conjunctiva/sclera: Conjunctivae normal.      Pupils: Pupils are equal, round, and reactive to light.  Neck:     Thyroid: No thyromegaly.  Cardiovascular:     Rate and Rhythm: Normal rate and regular rhythm.     Pulses: Normal pulses.     Heart sounds: Normal heart sounds. No murmur heard.    No friction rub.  Pulmonary:     Effort: Pulmonary effort is normal.     Breath sounds: Normal breath sounds. No wheezing, rhonchi or rales.  Abdominal:     General: Bowel sounds are normal.     Palpations: Abdomen is soft.     Tenderness: There is no abdominal tenderness.  Musculoskeletal:        General: No deformity. Normal range of motion.  Lymphadenopathy:     Cervical: No cervical adenopathy.  Skin:    General: Skin is warm and dry.     Findings: No lesion.  Neurological:     General: No focal deficit present.     Mental Status: She is alert and oriented to person, place, and time.  Psychiatric:        Mood and Affect: Mood normal.  Thought Content: Thought content normal.      No results found for any visits on 10/03/22.    Assessment & Plan:  Well adult exam -Anticipatory guidance given including wearing seatbelts, smoke detectors in the home, increasing physical activity, increasing p.o. intake of water and vegetables. -Labs -Immunizations reviewed -Pap done 09/28/2021 with OB/GYN -Mammogram done 03/26/2021 -Colonoscopy done 11/02/2021 -Next CPE in 1 year -     Hemoglobin A1c  Essential hypertension -Well-controlled -Continue current medications including Toprol XL 50 mg daily, Norvasc 10 mg daily -Continue lifestyle modifications -     Comprehensive metabolic panel -     TSH -     T4, free -     amLODIPine Besylate; Take 1 tablet (10 mg total) by mouth daily.  Dispense: 90 tablet; Refill: 3 -     Metoprolol Succinate ER; TAKE 1 TABLET BY MOUTH ONCE DAILY. TAKE WITH OR IMMEDIATELY FOLLOWING A MEAL.  Dispense: 90 tablet; Refill: 3  Seasonal allergies -Patient advised to take OTC antihistamine consistently  as the seasons change. -Consider Flonase or saline nasal rinse in addition to p.o. medication. -     CBC with Differential/Platelet  Arthritis of left knee -Patient advised to continue follow-up with Ortho to discuss further options as steroid injections are ineffective. -Continue supportive care with OTC topical analgesics, compression, ice, Tylenol/NSAIDs -     CBC with Differential/Platelet  Chronic pain of left knee -2/2 OA -Continue follow-up with Ortho -     CBC with Differential/Platelet  Anxiety with flying -Given upcoming trips will dispense 20 pills instead of 10. -     ALPRAZolam; Take 1 tablet (0.25 mg total) by mouth as needed for anxiety (30 mins prior to flying).  Dispense: 20 tablet; Refill: 0  Elevated lipoprotein(a) -LDL 118 on 09/26/2021 -     Hemoglobin A1c -     Lipid panel  Other chronic pain -Continue follow-up with Ortho -     DULoxetine HCl; Take 1 capsule (30 mg total) by mouth 2 (two) times daily.  Dispense: 180 capsule; Refill: 3 -     Gabapentin; Take 1 capsule (300 mg total) by mouth 3 (three) times daily.  Dispense: 270 capsule; Refill: 3  Vitamin D deficiency -     VITAMIN D 25 Hydroxy (Vit-D Deficiency, Fractures)   Goiter -Stable        -     TSH        -      T4, free   Return if symptoms worsen or fail to improve.   Billie Ruddy, MD

## 2022-10-28 ENCOUNTER — Other Ambulatory Visit: Payer: BC Managed Care – PPO

## 2022-10-28 ENCOUNTER — Telehealth: Payer: Self-pay

## 2022-10-28 ENCOUNTER — Other Ambulatory Visit: Payer: Self-pay | Admitting: Family Medicine

## 2022-10-28 DIAGNOSIS — E876 Hypokalemia: Secondary | ICD-10-CM

## 2022-10-28 LAB — CBC WITH DIFFERENTIAL/PLATELET
Basophils Absolute: 0 10*3/uL (ref 0.0–0.1)
Basophils Relative: 0.4 % (ref 0.0–3.0)
Eosinophils Absolute: 0 10*3/uL (ref 0.0–0.7)
Eosinophils Relative: 0 % (ref 0.0–5.0)
HCT: 45.1 % (ref 36.0–46.0)
Hemoglobin: 15.5 g/dL — ABNORMAL HIGH (ref 12.0–15.0)
Lymphocytes Relative: 18.8 % (ref 12.0–46.0)
Lymphs Abs: 1.7 10*3/uL (ref 0.7–4.0)
MCHC: 34.5 g/dL (ref 30.0–36.0)
MCV: 91.5 fl (ref 78.0–100.0)
Monocytes Absolute: 0.4 10*3/uL (ref 0.1–1.0)
Monocytes Relative: 5 % (ref 3.0–12.0)
Neutro Abs: 6.7 10*3/uL (ref 1.4–7.7)
Neutrophils Relative %: 75.8 % (ref 43.0–77.0)
Platelets: 233 10*3/uL (ref 150.0–400.0)
RBC: 4.92 Mil/uL (ref 3.87–5.11)
RDW: 13.4 % (ref 11.5–15.5)
WBC: 8.9 10*3/uL (ref 4.0–10.5)

## 2022-10-28 LAB — HEMOGLOBIN A1C: Hgb A1c MFr Bld: 5.3 % (ref 4.6–6.5)

## 2022-10-28 LAB — COMPREHENSIVE METABOLIC PANEL
ALT: 19 U/L (ref 0–35)
AST: 15 U/L (ref 0–37)
Albumin: 4.1 g/dL (ref 3.5–5.2)
Alkaline Phosphatase: 63 U/L (ref 39–117)
BUN: 9 mg/dL (ref 6–23)
CO2: 29 mEq/L (ref 19–32)
Calcium: 8.7 mg/dL (ref 8.4–10.5)
Chloride: 100 mEq/L (ref 96–112)
Creatinine, Ser: 0.95 mg/dL (ref 0.40–1.20)
GFR: 71.06 mL/min (ref >60.00)
Glucose, Bld: 100 mg/dL — ABNORMAL HIGH (ref 70–99)
Potassium: 2.7 mEq/L — CL (ref 3.5–5.1)
Sodium: 141 mEq/L (ref 135–145)
Total Bilirubin: 0.5 mg/dL (ref 0.2–1.2)
Total Protein: 7 g/dL (ref 6.0–8.3)

## 2022-10-28 LAB — VITAMIN D 25 HYDROXY (VIT D DEFICIENCY, FRACTURES): VITD: 23.53 ng/mL — ABNORMAL LOW (ref 30.00–100.00)

## 2022-10-28 LAB — LIPID PANEL
Cholesterol: 177 mg/dL (ref 0–200)
HDL: 46.9 mg/dL (ref >39.00)
NonHDL: 130.52
Total CHOL/HDL Ratio: 4
Triglycerides: 221 mg/dL — ABNORMAL HIGH (ref 0.0–149.0)
VLDL: 44.2 mg/dL — ABNORMAL HIGH (ref 0.0–40.0)

## 2022-10-28 LAB — LDL CHOLESTEROL, DIRECT: Direct LDL: 103 mg/dL

## 2022-10-28 LAB — T4, FREE: Free T4: 0.71 ng/dL (ref 0.60–1.60)

## 2022-10-28 LAB — TSH: TSH: 1.92 u[IU]/mL (ref 0.35–5.50)

## 2022-10-28 MED ORDER — POTASSIUM CHLORIDE CRYS ER 20 MEQ PO TBCR: EXTENDED_RELEASE_TABLET | ORAL | 0 refills | Status: DC

## 2022-10-28 NOTE — Telephone Encounter (Signed)
CRITICAL VALUE STICKER  CRITICAL VALUE:  Potassium 2.7  RECEIVER (on-site recipient of call):  DATE & TIME NOTIFIED:   MESSENGER (representative from lab): Karleen Hampshire   MD NOTIFIED: Dr. Salomon Fick  TIME OF NOTIFICATION:  RESPONSE:

## 2022-10-28 NOTE — Telephone Encounter (Signed)
Prescription for Rachel Warren potassium supplement was sent to patient's pharmacy.  Patient to take 2 tabs (40 mEq) daily x 3 days then take 1 tab daily (20 mEq).  Would have patient recheck labs next week along with magnesium to assess for resolution.

## 2022-11-01 ENCOUNTER — Other Ambulatory Visit: Payer: Self-pay | Admitting: Family Medicine

## 2022-11-01 DIAGNOSIS — E559 Vitamin D deficiency, unspecified: Secondary | ICD-10-CM

## 2022-11-01 MED ORDER — VITAMIN D (ERGOCALCIFEROL) 1.25 MG (50000 UNIT) PO CAPS: 50000.0000 [IU] | ORAL_CAPSULE | ORAL | 0 refills | Status: DC

## 2022-11-05 ENCOUNTER — Other Ambulatory Visit (INDEPENDENT_AMBULATORY_CARE_PROVIDER_SITE_OTHER): Payer: BC Managed Care – PPO

## 2022-11-05 DIAGNOSIS — E876 Hypokalemia: Secondary | ICD-10-CM

## 2022-11-05 LAB — BASIC METABOLIC PANEL
BUN: 12 mg/dL (ref 6–23)
CO2: 31 mEq/L (ref 19–32)
Calcium: 8.8 mg/dL (ref 8.4–10.5)
Chloride: 101 mEq/L (ref 96–112)
Creatinine, Ser: 0.91 mg/dL (ref 0.40–1.20)
GFR: 74.81 mL/min (ref >60.00)
Glucose, Bld: 81 mg/dL (ref 70–99)
Potassium: 2.9 mEq/L — ABNORMAL LOW (ref 3.5–5.1)
Sodium: 140 mEq/L (ref 135–145)

## 2022-11-05 LAB — MAGNESIUM: Magnesium: 1.7 mg/dL (ref 1.5–2.5)

## 2022-11-20 ENCOUNTER — Other Ambulatory Visit: Payer: Self-pay | Admitting: Family Medicine

## 2022-11-20 DIAGNOSIS — D509 Iron deficiency anemia, unspecified: Secondary | ICD-10-CM

## 2022-11-26 ENCOUNTER — Other Ambulatory Visit: Payer: Self-pay | Admitting: Family Medicine

## 2023-02-18 ENCOUNTER — Other Ambulatory Visit: Payer: Self-pay | Admitting: Family Medicine

## 2023-02-18 DIAGNOSIS — D509 Iron deficiency anemia, unspecified: Secondary | ICD-10-CM

## 2023-04-14 ENCOUNTER — Ambulatory Visit (INDEPENDENT_AMBULATORY_CARE_PROVIDER_SITE_OTHER): Payer: BC Managed Care – PPO

## 2023-04-14 ENCOUNTER — Telehealth: Payer: Self-pay | Admitting: Family Medicine

## 2023-04-14 DIAGNOSIS — Z23 Encounter for immunization: Secondary | ICD-10-CM | POA: Diagnosis not present

## 2023-04-14 NOTE — Telephone Encounter (Signed)
Parking Placard form to be filled out, placed in dr's folder.  Call [phone] upon completion.

## 2023-04-16 NOTE — Progress Notes (Unsigned)
Spoke with patient about forms for handcapped placard. Dr. Salomon Fick advised the patient to have the ortho Doctor she is seeing to fill out the forms since that is the reason for the placard. Patient stated that the Doctor refused her request.

## 2023-04-18 NOTE — Telephone Encounter (Signed)
Pt followed by Ortho.  Would be more appropriate to have them complete.

## 2023-05-20 ENCOUNTER — Encounter: Payer: Self-pay | Admitting: Sports Medicine

## 2023-05-20 ENCOUNTER — Other Ambulatory Visit: Payer: Self-pay

## 2023-05-20 ENCOUNTER — Other Ambulatory Visit (INDEPENDENT_AMBULATORY_CARE_PROVIDER_SITE_OTHER): Payer: Self-pay

## 2023-05-20 ENCOUNTER — Ambulatory Visit: Payer: BC Managed Care – PPO | Admitting: Sports Medicine

## 2023-05-20 ENCOUNTER — Other Ambulatory Visit: Payer: Self-pay | Admitting: Family Medicine

## 2023-05-20 DIAGNOSIS — M25562 Pain in left knee: Secondary | ICD-10-CM

## 2023-05-20 DIAGNOSIS — G8929 Other chronic pain: Secondary | ICD-10-CM

## 2023-05-20 DIAGNOSIS — M1712 Unilateral primary osteoarthritis, left knee: Secondary | ICD-10-CM | POA: Diagnosis not present

## 2023-05-20 DIAGNOSIS — D509 Iron deficiency anemia, unspecified: Secondary | ICD-10-CM

## 2023-05-20 DIAGNOSIS — M23007 Cystic meniscus, unspecified meniscus, left knee: Secondary | ICD-10-CM

## 2023-05-20 MED ORDER — MELOXICAM 15 MG PO TABS
15.0000 mg | ORAL_TABLET | Freq: Every day | ORAL | 1 refills | Status: DC
Start: 2023-05-20 — End: 2023-09-08

## 2023-05-20 NOTE — Progress Notes (Signed)
Patient says that she has been having knee pain that she has been managing that has gotten worse recently. She says that three months ago she had a day where she could not put any pressure on it, and it was swollen and painful. Patient walks with a cane and says that if she ever forgets to use it she will be sore. She mentions history of bilateral hip replacements. She also says that she has had steroid injections in the past that have not done much to help her.

## 2023-05-20 NOTE — Progress Notes (Signed)
Rachel Warren - 47 y.o. female MRN [id number]  Date of birth: 26-Feb-1976  Office Visit Note: Visit Date: 05/20/2023 PCP: Deeann Saint, MD Referred by: Deeann Saint, MD  Subjective: Chief Complaint  Patient presents with   Left Knee - Pain   HPI: Rachel Warren is a pleasant 47 y.o. female who presents today for acute on chronic left knee pain.  She has been seen at our practice before, Dr. Magnus Ivan actually replaced both of her hips in years past.  She is dealing with acute on chronic left knee pain.  Has had knee pain for years but about 3 months ago she had an incident where she had pain swelling and warmth to the knee.  Injury.  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  She uses a cane more for prevention of falls.  She takes Aleve 2 tablets once daily with some relief.  He has not done any physical therapy recently.  Reports crepitus about the knee as well as instability of the left greater than right knee.  Pertinent ROS were reviewed with the patient and found to be negative unless otherwise specified above in HPI.   Assessment & Plan: Visit Diagnoses:  1. Chronic pain of left knee   2. Patellofemoral arthritis of left knee   3. Perimeniscal cyst of left knee    Plan: Impression is acute exacerbation of chronic left knee pain with arthritic change most notable in the patellofemoral joint.  However, bedside ultrasound does show an abnormality over the lateral joint line which does appear to emanate overlying the lateral meniscus, suspicious for parameniscal cyst or other soft tissue mass.  Given her knee pain chronicity and these findings, I do think it is pertinent to obtain an MRI of the left knee as I do not think the majority of her symptoms are simply from her patellofemoral arthralgia.  We will start her on meloxicam 15 mg to be taken once daily with food for pain control.  A referral was sent to formalized physical therapy for the left knee.   She will follow-up with me in 1 week after MRI to review and discuss next steps.  Follow-up: Return in about 1 week (around 05/27/2023) for make f/u appt with Dr. Shon Baton 1-week after MRI knee.   Meds & Orders: No orders of the defined types were placed in this encounter.   Orders Placed This Encounter  Procedures   XR Knee Complete 4 Views Left   [place] Extrem Low Left Ltd     Procedures: No procedures performed      Clinical History: No specialty comments available.  She reports that she has been smoking cigarettes. She uses smokeless tobacco.  Recent Labs    10/28/22 0850  HGBA1C 5.3    Objective:    Physical Exam  Gen: Well-appearing, in no acute distress; non-toxic CV: Well-perfused. Warm.  Resp: Breathing unlabored on room air; no wheezing. Psych: Fluid speech in conversation; appropriate affect; normal thought process Neuro: Sensation intact throughout. No gross coordination deficits.   Ortho Exam - Left knee: Trace effusion on the left knee without warmth or redness.  Range of motion slightly limited in flexion from 0-125 degrees.  There is some soft tissue swelling and crepitus noted over the lateral joint line with mild prominence.  There is pain with patellar compression and patellar glide.  Ligamentously intact.  No varus or valgus instability.  Imaging: XR Knee Complete 4 Views  Left  Result Date: 05/20/2023 4 views of the left knee including standing AP, Rosenberg, lateral and sunrise view were ordered and reviewed by myself.  X-rays show only mild tibiofemoral joint space narrowing more so on the lateral joint line.  There is at least moderate patellofemoral arthralgia with narrowing.  Small spur off the lateral femoral condyle as well as the superior patella.  [place] Extrem Low Left Ltd  Result Date: 05/20/2023 Limited musculoskeletal ultrasound of the left knee was performed today.  There is trace effusion noted.  Small degree of spurring at the superior patella  without cortical regularity.  Patellar tendon seen without abnormality or tear.  Scanning over the lateral joint line there is a thickened lateral meniscus with isoechoic fluid collection superficial to this that does appear to reflect a possible parameniscal cyst.  This is not present on the contralateral knee.       Narrative & Impression  CLINICAL DATA:  Left knee pain and swelling   EXAM: LEFT KNEE - COMPLETE 4+ VIEW   COMPARISON:  None.   FINDINGS: Mild osteophytic changes are noted laterally and in the patellofemoral space. No joint effusion is seen. No acute fracture or dislocation is noted. No soft tissue abnormality is noted.   IMPRESSION: Degenerative changes without acute abnormality.     Electronically Signed   By: Alcide Clever M.D.   On: 09/21/2020 00:13    Past Medical/Family/Surgical/Social History: Medications & Allergies reviewed per EMR, new medications updated. 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   Fibroids 01/22/2018   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/28/2017   Unilateral primary osteoarthritis, left hip 11/06/2017   Unilateral primary osteoarthritis, right hip 11/06/2017   Lumbosacral radiculopathy at S1 09/12/2017   Back pain 08/19/2017   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   Abnormal gait 08/19/2017   Obesity 08/19/2017   Ventral hernia s/p laparoscopic repair with mesh 02/27/15 02/27/2015   Past Medical History:  Diagnosis Date   Anemia    on meds   Arthritis    generalized   Back pain    lower back    Blood transfusion without reported diagnosis 2020   Complication of anesthesia    age 73 had twilight anesthesia and woke up during surgery    Depression    on meds   Fatty liver 01/22/2016   Noted on MRI abd   Fibroids    Heavy periods    History of gallstones    History of thyroid nodule    Left    Hypertension    on meds   Irregular periods    Obesity    Pancreatic lesion    Pinched nerve    in back/sciatica   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   Ventral hernia 12/26/2014   Midline noted on CT Abd pelvis   Family History  Problem Relation Age of Onset   Hypertension Father    Arthritis Father    Clotting disorder Father    Prostate cancer Paternal Grandfather    Colon polyps Neg Hx    Colon cancer Neg Hx    Esophageal cancer Neg Hx    Rectal cancer Neg Hx    Stomach cancer Neg Hx    Past Surgical History:  Procedure Laterality Date   benign breast cyst removed Left  age 47   CHOLECYSTECTOMY     INSERTION OF MESH N/A 02/27/2015   Procedure: INSERTION OF MESH;  Surgeon: Avel Peace, MD;  Location: [facility];  Service: General;  Laterality: N/A;   IR ANGIOGRAM PELVIS SELECTIVE OR SUPRASELECTIVE  12/22/2018   IR ANGIOGRAM PELVIS SELECTIVE OR SUPRASELECTIVE  12/22/2018   IR ANGIOGRAM SELECTIVE EACH ADDITIONAL VESSEL  12/22/2018   IR ANGIOGRAM SELECTIVE EACH ADDITIONAL VESSEL  12/22/2018   IR EMBO TUMOR ORGAN ISCHEMIA INFARCT INC GUIDE ROADMAPPING  12/22/2018   IR RADIOLOGIST EVAL & MGMT  09/17/2018   IR RADIOLOGIST EVAL & MGMT  01/05/2019   IR RADIOLOGIST EVAL & MGMT  10/06/2019   IR US GUIDE VASC ACCESS RIGHT  12/22/2018   LAPAROSCOPIC ASSISTED VENTRAL HERNIA REPAIR N/A 02/27/2015   Procedure: LAPAROSCOPIC VENTRAL HERNIA REPAIR WITH MESH;  Surgeon: Avel Peace, MD;  Location: [facility];  Service: General;  Laterality: N/A;   THYROIDECTOMY, PARTIAL     TOTAL HIP ARTHROPLASTY Right 11/28/2017   Procedure: RIGHT TOTAL HIP ARTHROPLASTY ANTERIOR APPROACH;  Surgeon: Kathryne Hitch, MD;  Location: [facility];  Service: Orthopedics;  Laterality: Right;   TOTAL HIP ARTHROPLASTY Left 07/31/2018   Procedure: LEFT TOTAL HIP ARTHROPLASTY ANTERIOR APPROACH;  Surgeon: Kathryne Hitch, MD;  Location: [facility];  Service: Orthopedics;  Laterality: Left;  Failed Spinal    Social History   Occupational History   Not on file  Tobacco Use   Smoking status: Some Days    Current packs/day: 0.00    Types: Cigarettes    Last attempt to quit: 06/14/2017    Years since quitting: 5.9   Smokeless tobacco: Current   Tobacco comments:    Pt states she does vape and does smoke occasionally Tay 02/08/22  Vaping Use   Vaping status: Never Used  Substance and Sexual Activity   Alcohol use: Yes    Alcohol/week: 4.0 standard drinks of alcohol    Types: 4 Standard drinks or equivalent per week   Drug use: Yes    Frequency: 2.0 times per week    Types: Marijuana   Sexual activity: Yes    Birth control/protection: Pill    Comment: continuous OCP

## 2023-06-03 ENCOUNTER — Encounter: Payer: Self-pay | Admitting: Sports Medicine

## 2023-06-05 ENCOUNTER — Encounter: Payer: Self-pay | Admitting: Physical Therapy

## 2023-06-05 ENCOUNTER — Ambulatory Visit: Payer: BC Managed Care – PPO | Admitting: Physical Therapy

## 2023-06-05 ENCOUNTER — Other Ambulatory Visit: Payer: Self-pay

## 2023-06-05 DIAGNOSIS — G8929 Other chronic pain: Secondary | ICD-10-CM | POA: Diagnosis not present

## 2023-06-05 DIAGNOSIS — M6281 Muscle weakness (generalized): Secondary | ICD-10-CM | POA: Diagnosis not present

## 2023-06-05 DIAGNOSIS — R2689 Other abnormalities of gait and mobility: Secondary | ICD-10-CM | POA: Diagnosis not present

## 2023-06-05 DIAGNOSIS — M25562 Pain in left knee: Secondary | ICD-10-CM

## 2023-06-05 NOTE — Therapy (Signed)
OUTPATIENT PHYSICAL THERAPY EVALUATION   Patient Name: Rachel Warren MRN: [id number] DOB:03-Nov-1975, 47 y.o., female Today's Date: 06/05/2023  END OF SESSION:  PT End of Session - 06/05/23 1540     Visit Number 1    Number of Visits 4    Date for PT Re-Evaluation 07/31/23    Authorization Type [employer] $52 copay    PT Start Time 1508    PT Stop Time 1540    PT Time Calculation (min) 32 min    Activity Tolerance Patient tolerated treatment well    Behavior During Therapy WFL for tasks assessed/performed             Past Medical History:  Diagnosis Date   Anemia    on meds   Arthritis    generalized   Back pain    lower back    Blood transfusion without reported diagnosis 2020   Complication of anesthesia    age 68 had twilight anesthesia and woke up during surgery    Depression    on meds   Fatty liver 01/22/2016   Noted on MRI abd   Fibroids    Heavy periods    History of gallstones    History of thyroid nodule    Left   Hypertension    on meds   Irregular periods    Obesity    Pancreatic lesion    Pinched nerve    in back/sciatica   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   Ventral hernia 12/26/2014   Midline noted on CT Abd pelvis   Past Surgical History:  Procedure Laterality Date   benign breast cyst removed Left    age 30   CHOLECYSTECTOMY     INSERTION OF MESH N/A 02/27/2015   Procedure: INSERTION OF MESH;  Surgeon: Avel Peace, MD;  Location: [facility];  Service: General;  Laterality: N/A;   IR ANGIOGRAM PELVIS SELECTIVE OR SUPRASELECTIVE  12/22/2018   IR ANGIOGRAM PELVIS SELECTIVE OR SUPRASELECTIVE  12/22/2018   IR ANGIOGRAM SELECTIVE EACH ADDITIONAL VESSEL  12/22/2018   IR ANGIOGRAM SELECTIVE EACH ADDITIONAL VESSEL  12/22/2018   IR EMBO TUMOR ORGAN ISCHEMIA INFARCT INC GUIDE ROADMAPPING  12/22/2018   IR RADIOLOGIST EVAL & MGMT  09/17/2018   IR RADIOLOGIST EVAL & MGMT  01/05/2019   IR RADIOLOGIST EVAL & MGMT  10/06/2019   IR US  GUIDE VASC ACCESS RIGHT  12/22/2018   LAPAROSCOPIC ASSISTED VENTRAL HERNIA REPAIR N/A 02/27/2015   Procedure: LAPAROSCOPIC VENTRAL HERNIA REPAIR WITH MESH;  Surgeon: Avel Peace, MD;  Location: [facility];  Service: General;  Laterality: N/A;   THYROIDECTOMY, PARTIAL     TOTAL HIP ARTHROPLASTY Right 11/28/2017   Procedure: RIGHT TOTAL HIP ARTHROPLASTY ANTERIOR APPROACH;  Surgeon: Kathryne Hitch, MD;  Location: [facility];  Service: Orthopedics;  Laterality: Right;   TOTAL HIP ARTHROPLASTY Left 07/31/2018   Procedure: LEFT TOTAL HIP ARTHROPLASTY ANTERIOR APPROACH;  Surgeon: Kathryne Hitch, MD;  Location: [facility];  Service: Orthopedics;  Laterality: Left;  Failed Spinal   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   Fibroids 01/22/2018   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/28/2017   Unilateral primary osteoarthritis, left hip 11/06/2017   Unilateral primary osteoarthritis, right hip 11/06/2017   Lumbosacral radiculopathy at S1 09/12/2017   Back pain 08/19/2017  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   Abnormal gait 08/19/2017   Obesity 08/19/2017   Ventral hernia s/p laparoscopic repair with mesh 02/27/15 02/27/2015    PCP: Deeann Saint, MD  REFERRING PROVIDER: Madelyn Brunner, DO  REFERRING DIAG: [phone] (ICD-10-CM) - Chronic pain of left knee M17.12 (ICD-10-CM) - Patellofemoral arthritis of left knee M23.007 (ICD-10-CM) - Perimeniscal cyst of left knee  Rationale for Evaluation and Treatment: Rehabilitation  THERAPY DIAG:  Chronic pain of left knee - Plan: PT plan of care cert/re-cert  Muscle weakness (generalized) - Plan: PT plan of care cert/re-cert  Other abnormalities of gait and mobility - Plan: PT plan of care cert/re-cert  ONSET DATE: 2-3 months ago   SUBJECTIVE:                                                                                                                                                                                            SUBJECTIVE STATEMENT: Pt is a 47 y.o. female who presents today for acute on chronic left knee pain. She reports pain is chronic for about 3 years, but had recent exacerbation with inability to get out of bed due to pain which occurred about 2-3 months ago.  PERTINENT HISTORY:  Anemia, arthritis, depression, obesity, bil THA  PAIN:  Are you having pain? Yes: NPRS scale: 6 currently, up to 10, at best 6/10 Pain location: Lt knee Pain description: electric shocks, stiffness  Aggravating factors: prolonged positioning, standing, walking, squatting Relieving factors: medication  PRECAUTIONS:  None  RED FLAGS: None   WEIGHT BEARING RESTRICTIONS:  No  FALLS:  Has patient fallen in last 6 months? 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  PLOF:  Independent and Leisure: gardening, swimming, movies, was going to the gym but stopped when she was having hip pain; still has gym membership  PATIENT GOALS:  Improve pain   OBJECTIVE:   DIAGNOSTIC FINDINGS:  MRI scheduled 06/08/23  PATIENT SURVEYS:  06/05/23 FOTO 36 (predicted 56)  COGNITIVE STATUS: Within functional limits for tasks assessed   SENSATION: WFL  POSTURE:  No Significant postural limitations  GAIT: 06/05/23 Comments: amb mod I with SPC; antalgic gait noted   EDEMA: 06/05/23: mild swelling noted in Lt knee   LOWER EXTREMITY ROM:     ROM Right eval Left eval  Knee flexion  A: 125  Knee extension A: 8 A: -3   (Blank rows = not tested)  LOWER EXTREMITY MMT:    MMT Right eval Left eval  Hip flexion    Hip extension  3/5  Hip abduction  3/5  Hip adduction    Hip internal  rotation    Hip external rotation    Knee flexion 5/5 5/5  Knee extension 5/5 4/5  with pain   (Blank rows = not tested)      TREATMENT:                                                                                                                              DATE:  06/05/23 See HEP - demonstrated with trial reps performed PRN for comprehension     PATIENT EDUCATION:  Education details: HEP Person educated: Patient Education method: Programmer, multimedia, Demonstration, and Handouts Education comprehension: verbalized understanding, returned demonstration, and needs further education  HOME EXERCISE PROGRAM: Access Code: [email or web address] URL: [email or web address] Date: 06/05/2023 Prepared by: Moshe Cipro  Exercises - Seated Straight Leg Raise  - 2 x daily - 7 x weekly - 2 sets - 10 reps - Supine Bridge  - 2 x daily - 7 x weekly - 2 sets - 10 reps - 5 sec hold - Sidelying Hip Circles  - 2 x daily - 7 x weekly - 2 sets - 10 circles each way   ASSESSMENT:  CLINICAL IMPRESSION: Patient is a 47 y.o. female who was seen today for physical therapy evaluation and treatment for acute exacerbation of chronic Lt knee pain. She demonstrates decreased strength and gait abnormalities with continued pain affecting functional mobility.  She will benefit from PT to address deficits listed.     OBJECTIVE IMPAIRMENTS: Abnormal gait, difficulty walking, decreased ROM, decreased strength, increased edema, and pain.   ACTIVITY LIMITATIONS: carrying, bending, sitting, standing, squatting, stairs, transfers, and locomotion level  PARTICIPATION LIMITATIONS: meal prep, cleaning, laundry, shopping, community activity, and occupation  PERSONAL FACTORS: 3+ comorbidities: Anemia, arthritis, depression, obesity, bil THA  are also affecting patient's functional outcome.   REHAB POTENTIAL: Good  CLINICAL DECISION MAKING: Evolving/moderate complexity  EVALUATION COMPLEXITY:  Moderate   GOALS: Goals reviewed with patient? Yes  SHORT TERM GOALS: Target date: 07/03/2023  Independent with initial HEP Goal status: INITIAL    LONG TERM GOALS: Target date: 07/31/2023  Independent with final HEP Goal status: INITIAL  2.  FOTO score improved to 56 Goal status: INITIAL  3.  LLE strength improved to 4+/5 for improved function and mobility Goal status: INIITAL  4.  Report pain < 3/10 with standing and walking for improved function Goal status: INITIAL  PLAN:  PT FREQUENCY: every other week  PT DURATION: 8 weeks  PLANNED INTERVENTIONS: 97164- PT Re-evaluation, 97110-Therapeutic exercises, 97530- Therapeutic activity, 97112- Neuromuscular re-education, 97535- Self Care, 86578- Manual therapy, [id number]- Gait training, [phone]- Aquatic Therapy, 97014- Electrical stimulation (unattended), 97016- Vasopneumatic device, [id number]- Ultrasound, [id number]- Ionotophoresis 4mg /ml Dexamethasone, Patient/Family education, Balance training, Stair training, Taping, Dry Needling, Cryotherapy, and Moist heat.  PLAN  FOR NEXT SESSION: review HEP, plan to give gym program as well as pool program   NEXT MD VISIT: Needs to schedule after MRI   Clarita Crane, PT, DPT 06/05/23 3:45 PM

## 2023-06-08 ENCOUNTER — Ambulatory Visit
Admission: RE | Admit: 2023-06-08 | Discharge: 2023-06-08 | Disposition: A | Discharge status: Home / Self Care | Payer: BC Managed Care – PPO | Source: Ambulatory Visit | Attending: Sports Medicine | Admitting: Sports Medicine

## 2023-06-08 DIAGNOSIS — M1712 Unilateral primary osteoarthritis, left knee: Secondary | ICD-10-CM

## 2023-06-08 DIAGNOSIS — M23007 Cystic meniscus, unspecified meniscus, left knee: Secondary | ICD-10-CM

## 2023-06-08 DIAGNOSIS — G8929 Other chronic pain: Secondary | ICD-10-CM

## 2023-06-19 ENCOUNTER — Encounter: Payer: Self-pay | Admitting: Physical Therapy

## 2023-06-19 ENCOUNTER — Ambulatory Visit: Payer: BC Managed Care – PPO | Admitting: Physical Therapy

## 2023-06-19 DIAGNOSIS — R2689 Other abnormalities of gait and mobility: Secondary | ICD-10-CM

## 2023-06-19 DIAGNOSIS — G8929 Other chronic pain: Secondary | ICD-10-CM | POA: Diagnosis not present

## 2023-06-19 DIAGNOSIS — M6281 Muscle weakness (generalized): Secondary | ICD-10-CM

## 2023-06-19 DIAGNOSIS — M25562 Pain in left knee: Secondary | ICD-10-CM | POA: Diagnosis not present

## 2023-06-19 NOTE — Therapy (Signed)
OUTPATIENT PHYSICAL THERAPY TREATMENT   Patient Name: Rachel Warren MRN: [id number] DOB:January 30, 1976, 47 y.o., female Today's Date: 06/19/2023  END OF SESSION:  PT End of Session - 06/19/23 1447     Visit Number 2    Number of Visits 4    Date for PT Re-Evaluation 07/31/23    Authorization Type [employer] $52 copay    PT Start Time 1440   pt arrived late   PT Stop Time 1510    PT Time Calculation (min) 30 min    Activity Tolerance Patient tolerated treatment well    Behavior During Therapy [facility] for tasks assessed/performed              Past Medical History:  Diagnosis Date   Anemia    on meds   Arthritis    generalized   Back pain    lower back    Blood transfusion without reported diagnosis 2020   Complication of anesthesia    age 33 had twilight anesthesia and woke up during surgery    Depression    on meds   Fatty liver 01/22/2016   Noted on MRI abd   Fibroids    Heavy periods    History of gallstones    History of thyroid nodule    Left   Hypertension    on meds   Irregular periods    Obesity    Pancreatic lesion    Pinched nerve    in back/sciatica   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   Ventral hernia 12/26/2014   Midline noted on CT Abd pelvis   Past Surgical History:  Procedure Laterality Date   benign breast cyst removed Left    age 15   CHOLECYSTECTOMY     INSERTION OF MESH N/A 02/27/2015   Procedure: INSERTION OF MESH;  Surgeon: Avel Peace, MD;  Location: [facility];  Service: General;  Laterality: N/A;   IR ANGIOGRAM PELVIS SELECTIVE OR SUPRASELECTIVE  12/22/2018   IR ANGIOGRAM PELVIS SELECTIVE OR SUPRASELECTIVE  12/22/2018   IR ANGIOGRAM SELECTIVE EACH ADDITIONAL VESSEL  12/22/2018   IR ANGIOGRAM SELECTIVE EACH ADDITIONAL VESSEL  12/22/2018   IR EMBO TUMOR ORGAN ISCHEMIA INFARCT INC GUIDE ROADMAPPING  12/22/2018   IR RADIOLOGIST EVAL & MGMT  09/17/2018   IR RADIOLOGIST EVAL & MGMT  01/05/2019   IR RADIOLOGIST EVAL & MGMT   10/06/2019   IR US GUIDE VASC ACCESS RIGHT  12/22/2018   LAPAROSCOPIC ASSISTED VENTRAL HERNIA REPAIR N/A 02/27/2015   Procedure: LAPAROSCOPIC VENTRAL HERNIA REPAIR WITH MESH;  Surgeon: Avel Peace, MD;  Location: [facility];  Service: General;  Laterality: N/A;   THYROIDECTOMY, PARTIAL     TOTAL HIP ARTHROPLASTY Right 11/28/2017   Procedure: RIGHT TOTAL HIP ARTHROPLASTY ANTERIOR APPROACH;  Surgeon: Kathryne Hitch, MD;  Location: [facility];  Service: Orthopedics;  Laterality: Right;   TOTAL HIP ARTHROPLASTY Left 07/31/2018   Procedure: LEFT TOTAL HIP ARTHROPLASTY ANTERIOR APPROACH;  Surgeon: Kathryne Hitch, MD;  Location: [facility];  Service: Orthopedics;  Laterality: Left;  Failed Spinal   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   Fibroids 01/22/2018   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/28/2017   Unilateral primary osteoarthritis, left hip 11/06/2017   Unilateral primary osteoarthritis, right hip 11/06/2017   Lumbosacral radiculopathy at S1 09/12/2017  Back pain 08/19/2017   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   Abnormal gait 08/19/2017   Obesity 08/19/2017   Ventral hernia s/p laparoscopic repair with mesh 02/27/15 02/27/2015    PCP: Deeann Saint, MD  REFERRING PROVIDER: Madelyn Brunner, DO  REFERRING DIAG: [phone] (ICD-10-CM) - Chronic pain of left knee M17.12 (ICD-10-CM) - Patellofemoral arthritis of left knee M23.007 (ICD-10-CM) - Perimeniscal cyst of left knee  Rationale for Evaluation and Treatment: Rehabilitation  THERAPY DIAG:  Chronic pain of left knee  Muscle weakness (generalized)  Other abnormalities of gait and mobility  ONSET DATE: 2-3 months ago   SUBJECTIVE:                                                                                                                                                                                            SUBJECTIVE STATEMENT: Pain feels better; not as aching as it has been.   PERTINENT HISTORY:  Anemia, arthritis, depression, obesity, bil THA  PAIN:  Are you having pain? Yes: NPRS scale: 4-5 currently, up to 10, at best 6/10 Pain location: Lt knee Pain description: electric shocks, stiffness  Aggravating factors: prolonged positioning, standing, walking, squatting Relieving factors: medication  PRECAUTIONS:  None  RED FLAGS: None   WEIGHT BEARING RESTRICTIONS:  No  FALLS:  Has patient fallen in last 6 months? 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  PLOF:  Independent and Leisure: gardening, swimming, movies, was going to the gym but stopped when she was having hip pain; still has gym membership  PATIENT GOALS:  Improve pain   OBJECTIVE:   DIAGNOSTIC FINDINGS:  MRI scheduled 06/08/23  PATIENT SURVEYS:  06/05/23 FOTO 36 (predicted 56)  COGNITIVE STATUS: Within functional limits for tasks assessed   SENSATION: WFL  POSTURE:  No Significant postural limitations  GAIT: 06/05/23 Comments: amb mod I with SPC; antalgic gait noted   EDEMA: 06/05/23: mild swelling noted in Lt knee   LOWER EXTREMITY ROM:     ROM Right eval Left eval  Knee flexion  A: 125  Knee extension A: 8 A: -3   (Blank rows = not tested)   LOWER EXTREMITY MMT:    MMT Right eval Left eval  Hip flexion    Hip extension  3/5  Hip abduction  3/5  Hip adduction    Hip internal rotation    Hip external rotation    Knee flexion 5/5 5/5  Knee  extension 5/5 4/5  with pain   (Blank rows = not tested)      TREATMENT:                                                                                                                              DATE:   06/19/23 TherEx NuStep L5 x 6 min Seated Lt SLR x 10 reps Bridges x10 reps Sidelying hip circles x 10 reps each direction; LLE Sidelying LLE hip extension x 10 reps  Manual IASTM with percussive device x 3 min to Lt quad   06/05/23 See HEP - demonstrated with trial reps performed PRN for comprehension     PATIENT EDUCATION:  Education details: HEP Person educated: Patient Education method: Programmer, multimedia, Demonstration, and Handouts Education comprehension: verbalized understanding, returned demonstration, and needs further education  HOME EXERCISE PROGRAM: Access Code: [email or web address] URL: [email or web address] Date: 06/05/2023 Prepared by: Moshe Cipro  Exercises - Seated Straight Leg Raise  - 2 x daily - 7 x weekly - 2 sets - 10 reps - Supine Bridge  - 2 x daily - 7 x weekly - 2 sets - 10 reps - 5 sec hold - Sidelying Hip Circles  - 2 x daily - 7 x weekly - 2 sets - 10 circles each way   ASSESSMENT:  CLINICAL IMPRESSION: Pt reporting decreased pain since initial eval and demonstrated good understanding of HEP.  Will continue to benefit from PT to maximize function.   OBJECTIVE IMPAIRMENTS: Abnormal gait, difficulty walking, decreased ROM, decreased strength, increased edema, and pain.   ACTIVITY LIMITATIONS: carrying, bending, sitting, standing, squatting, stairs, transfers, and locomotion level  PARTICIPATION LIMITATIONS: meal prep, cleaning, laundry, shopping, community activity, and occupation  PERSONAL FACTORS: 3+ comorbidities: Anemia, arthritis, depression, obesity, bil THA  are also affecting patient's functional outcome.   REHAB POTENTIAL: Good  CLINICAL DECISION MAKING: Evolving/moderate complexity  EVALUATION COMPLEXITY: Moderate   GOALS: Goals reviewed with patient? Yes  SHORT TERM GOALS: Target date: 07/03/2023  Independent with initial HEP Goal status: INITIAL    LONG TERM GOALS: Target date: 07/31/2023  Independent with  final HEP Goal status: INITIAL  2.  FOTO score improved to 56 Goal status: INITIAL  3.  LLE strength improved to 4+/5 for improved function and mobility Goal status: INIITAL  4.  Report pain < 3/10 with standing and walking for improved function Goal status: INITIAL  PLAN:  PT FREQUENCY: every other week  PT DURATION: 8 weeks  PLANNED INTERVENTIONS: 97164- PT Re-evaluation, 97110-Therapeutic exercises, 97530- Therapeutic activity, 97112- Neuromuscular re-education, 97535- Self Care, [id number]- Manual therapy, [id number]- Gait training, [id number]- Aquatic Therapy, 97014- Electrical stimulation (unattended), 97016- Vasopneumatic device, [id number]- Ultrasound, [id number]- Ionotophoresis 4mg /ml Dexamethasone, Patient/Family education, Balance training, Stair training, Taping, Dry Needling, Cryotherapy, and Moist heat.  PLAN FOR NEXT SESSION: review HEP, plan to give gym program as well as pool program   NEXT MD VISIT: Needs to schedule after MRI   Clarita Crane,  PT, DPT 06/19/23 3:12 PM

## 2023-06-23 ENCOUNTER — Ambulatory Visit: Payer: BC Managed Care – PPO | Admitting: Family Medicine

## 2023-06-23 VITALS — BP 130/74 | HR 64 | Temp 98.7°F | Ht 68.0 in | Wt 283.4 lb

## 2023-06-23 DIAGNOSIS — S29011A Strain of muscle and tendon of front wall of thorax, initial encounter: Secondary | ICD-10-CM

## 2023-06-23 DIAGNOSIS — Z91013 Allergy to seafood: Secondary | ICD-10-CM

## 2023-06-23 DIAGNOSIS — Z91018 Allergy to other foods: Secondary | ICD-10-CM | POA: Diagnosis not present

## 2023-06-23 DIAGNOSIS — K649 Unspecified hemorrhoids: Secondary | ICD-10-CM

## 2023-06-23 DIAGNOSIS — R0982 Postnasal drip: Secondary | ICD-10-CM

## 2023-06-23 DIAGNOSIS — J3489 Other specified disorders of nose and nasal sinuses: Secondary | ICD-10-CM

## 2023-06-23 MED ORDER — EPINEPHRINE 0.3 MG/0.3ML IJ SOAJ: 0.3000 mg | INTRAMUSCULAR | 3 refills | Status: AC | PRN

## 2023-06-23 NOTE — Progress Notes (Signed)
Established Patient Office Visit   Subjective  Patient ID: Rachel Warren, female    DOB: 1976-01-10  Age: 47 y.o. MRN: [id number]  Chief Complaint  Patient presents with   Medical Management of Chronic Issues    Allergic reaction to seafood and snickers,  hemorrhoids, chest pain after laying, on stomach to sleep, post nasal drainage, runny nose,     Patient is a 47 year old female seen for acute concerns.  Pt developing itchy tongue and throat after eating snickers, walnuts, pecans, lobster, or more than one cluster of crab legs.  No symptoms noted with shrimp or almonds.  Pt eats nuts regularly.    Pt previously unable to sleep on her stomach prior to hip surgery.  Now finds her self waking up on stomach with L shoulder pain going across L upper chest diagonally.  Pain resolves after getting up.  Does not occur during the day.  Unsure if sleeping on arm.  Has difficulty describing the sensation.  Pt feels like she may be getting a cold. Symptoms started this morning while at work.  Pt slight sore throat, post-nasal drainage, rhinorrhea, and general fatigue.  Notes strep throat going around her school.  Pt having episodes of hemorrhoids.  Likes to read while on the toilet.  Denies straining, constipation, or bleeding.  Tried sitz baths.  Felt like was "sitting on extra tissue".  Had hemorrhiods after childbirth, but none sense.  Pt mentions picking at hairs on forearms, but not pulling them out.  Finds it more soothing to pick other ppls hair.    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   Fibroids 01/22/2018   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/28/2017   Unilateral primary osteoarthritis, left hip 11/06/2017   Unilateral primary osteoarthritis, right hip 11/06/2017   Lumbosacral radiculopathy at S1 09/12/2017   Back pain 08/19/2017    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   Abnormal gait 08/19/2017   Obesity 08/19/2017   Ventral hernia s/p laparoscopic repair with mesh 02/27/15 02/27/2015   Past Medical History:  Diagnosis Date   Anemia    on meds   Arthritis    generalized   Back pain    lower back    Blood transfusion without reported diagnosis 2020   Complication of anesthesia    age 16 had twilight anesthesia and woke up during surgery    Depression    on meds   Fatty liver 01/22/2016   Noted on MRI abd   Fibroids    Heavy periods    History of gallstones    History of thyroid nodule    Left   Hypertension    on meds   Irregular periods    Obesity    Pancreatic lesion    Pinched nerve    in back/sciatica   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   Ventral hernia 12/26/2014   Midline noted on CT Abd pelvis   Past Surgical History:  Procedure Laterality Date   benign breast cyst removed Left    age 58   CHOLECYSTECTOMY     INSERTION OF MESH N/A 02/27/2015   Procedure: INSERTION OF MESH;  Surgeon: Avel Peace, MD;  Location: [facility];  Service: General;  Laterality: N/A;   IR ANGIOGRAM PELVIS SELECTIVE OR SUPRASELECTIVE  12/22/2018   IR ANGIOGRAM PELVIS SELECTIVE  OR SUPRASELECTIVE  12/22/2018   IR ANGIOGRAM SELECTIVE EACH ADDITIONAL VESSEL  12/22/2018   IR ANGIOGRAM SELECTIVE EACH ADDITIONAL VESSEL  12/22/2018   IR EMBO TUMOR ORGAN ISCHEMIA INFARCT INC GUIDE ROADMAPPING  12/22/2018   IR RADIOLOGIST EVAL & MGMT  09/17/2018   IR RADIOLOGIST EVAL & MGMT  01/05/2019   IR RADIOLOGIST EVAL & MGMT  10/06/2019   IR US GUIDE VASC ACCESS RIGHT  12/22/2018   LAPAROSCOPIC ASSISTED VENTRAL HERNIA REPAIR N/A 02/27/2015   Procedure: LAPAROSCOPIC VENTRAL HERNIA REPAIR WITH MESH;  Surgeon: Avel Peace, MD;  Location: [facility];  Service: General;  Laterality: N/A;   THYROIDECTOMY, PARTIAL     TOTAL HIP ARTHROPLASTY Right 11/28/2017   Procedure: RIGHT TOTAL HIP ARTHROPLASTY ANTERIOR APPROACH;  Surgeon: Kathryne Hitch, MD;  Location: [facility];  Service: Orthopedics;  Laterality: Right;   TOTAL HIP ARTHROPLASTY Left 07/31/2018   Procedure: LEFT TOTAL HIP ARTHROPLASTY ANTERIOR APPROACH;  Surgeon: Kathryne Hitch, MD;  Location: [facility];  Service: Orthopedics;  Laterality: Left;  Failed Spinal   Social History   Tobacco Use   Smoking status: Some Days    Current packs/day: 0.00    Types: Cigarettes    Last attempt to quit: 06/14/2017    Years since quitting: 6.0   Smokeless tobacco: Current   Tobacco comments:    Pt states she does vape and does smoke occasionally Tay 02/08/22  Vaping Use   Vaping status: Never Used  Substance Use Topics   Alcohol use: Yes    Alcohol/week: 4.0 standard drinks of alcohol    Types: 4 Standard drinks or equivalent per week   Drug use: Yes    Frequency: 2.0 times per week    Types: Marijuana   Family History  Problem Relation Age of Onset   Hypertension Father    Arthritis Father    Clotting disorder Father    Prostate cancer Paternal Grandfather    Colon polyps Neg Hx    Colon cancer Neg Hx    Esophageal cancer Neg Hx    Rectal cancer Neg Hx    Stomach cancer Neg Hx    No Known Allergies    ROS Negative unless stated above    Objective:     BP 130/74 (BP Location: Left Arm, Patient Position: Sitting, Cuff Size: Large)   Pulse 64   Temp 98.7 F (37.1 C) (Oral)   Ht 5\' 8"  (1.727 m)   Wt 283 lb 6.4 oz (128.5 kg)   BMI 43.09 kg/m  BP Readings from Last 3 Encounters:  06/23/23 130/74  10/03/22 126/76  11/09/21 130/80   Wt Readings from Last 3 Encounters:  06/23/23 283 lb 6.4 oz (128.5 kg)  10/03/22 288 lb 12.8 oz (131 kg)  02/08/22 288 lb (130.6 kg)      Physical Exam Constitutional:      General: She is not in acute distress.    Appearance: Normal appearance.  HENT:     Head: Normocephalic and atraumatic.     Nose: Nose normal.     Mouth/Throat:     Mouth: Mucous membranes are moist.  Cardiovascular:     Rate and  Rhythm: Normal rate and regular rhythm.     Heart sounds: Normal heart sounds. No murmur heard.    No gallop.  Pulmonary:     Effort: Pulmonary effort is normal. No respiratory distress.     Breath sounds: Normal breath sounds. No wheezing, rhonchi or rales.  Musculoskeletal:  Comments: No TTP of chest.  Skin:    General: Skin is warm and dry.     Comments: Hyperpigmented circumscribed areas on bilateral forearms.  Neurological:     Mental Status: She is alert and oriented to person, place, and time.     No results found for any visits on 06/23/23.    Assessment & Plan:  Seafood allergy -pt advised to avoid eating seafood given current symptoms -Epi pen.  Discussed proper use. -allergy referral for allergy testing. -     EPINEPHrine; Inject 0.3 mg into the muscle as needed for anaphylaxis.  Dispense: 2 each; Refill: 3 -     Ambulatory referral to Allergy  Nut allergy  -avoidance -allergy testing -epi pen -     EPINEPHrine; Inject 0.3 mg into the muscle as needed for anaphylaxis.  Dispense: 2 each; Refill: 3 -     Ambulatory referral to Allergy  Post-nasal drainage -possibly start of viral uri -Supportive care with OTC cough/cold meds, antihistamines, tylenol prn, flonase.  Muscle strain of anterior chest wall -More muscle L sided ant chest wall and L shoulder soreness due to sleeping position. -supportive care  Rhinorrhea -flonase  Hemorrhoids, unspecified hemorrhoid type -OTC tucks, preparation H, sitz baths, etc -discussed prevention.  Avoid prolonged sitting on toilet. -for continued or worsened symptoms referral to GI.   Return if symptoms worsen or fail to improve.   Deeann Saint, MD

## 2023-07-03 ENCOUNTER — Encounter: Payer: BC Managed Care – PPO | Admitting: Physical Therapy

## 2023-07-07 ENCOUNTER — Encounter: Payer: Self-pay | Admitting: Sports Medicine

## 2023-07-07 ENCOUNTER — Ambulatory Visit: Payer: BC Managed Care – PPO | Admitting: Sports Medicine

## 2023-07-07 ENCOUNTER — Other Ambulatory Visit: Payer: Self-pay

## 2023-07-07 DIAGNOSIS — M25562 Pain in left knee: Secondary | ICD-10-CM | POA: Diagnosis not present

## 2023-07-07 DIAGNOSIS — S83282D Other tear of lateral meniscus, current injury, left knee, subsequent encounter: Secondary | ICD-10-CM

## 2023-07-07 DIAGNOSIS — M23007 Cystic meniscus, unspecified meniscus, left knee: Secondary | ICD-10-CM

## 2023-07-07 DIAGNOSIS — G8929 Other chronic pain: Secondary | ICD-10-CM

## 2023-07-07 MED ORDER — METHYLPREDNISOLONE ACETATE 40 MG/ML IJ SUSP
40.0000 mg | INTRAMUSCULAR | Status: AC | PRN
  Administered 2023-07-07: 40 mg via INTRA_ARTICULAR

## 2023-07-07 MED ORDER — LIDOCAINE HCL 1 % IJ SOLN
1.0000 mL | INTRAMUSCULAR | Status: AC | PRN
  Administered 2023-07-07: 1 mL

## 2023-07-07 MED ORDER — BUPIVACAINE HCL 0.25 % IJ SOLN
1.0000 mL | INTRAMUSCULAR | Status: AC | PRN
  Administered 2023-07-07: 1 mL via INTRA_ARTICULAR

## 2023-07-07 NOTE — Progress Notes (Signed)
Rachel Warren - 47 y.o. female MRN [id number]  Date of birth: April 07, 1976  Office Visit Note: Visit Date: 07/07/2023 PCP: Deeann Saint, MD Referred by: Deeann Saint, MD  Subjective: Chief Complaint  Patient presents with   Left Knee - Follow-up   HPI: Rachel Warren is a pleasant 47 y.o. female who presents today for chronic left knee pain; MRI review.  She has had swelling more so over the lateral joint line for the last few months.  We did ultrasound the knee which had a cystic structure that seemed like a parameniscal cyst, although wanted to obtain an MRI for better quantification.  She had this which showed parameniscal cyst but also lateral meniscus tear and knee degenerative OA.  Her pain still continues in the knee which is limiting her activity.  She has had a few sessions of formalized physical therapy but still is limited secondary to her pain.  Pertinent ROS were reviewed with the patient and found to be negative unless otherwise specified above in HPI.   Assessment & Plan: Visit Diagnoses:  1. Chronic pain of left knee   2. Perimeniscal cyst of left knee   3. Tear of lateral meniscus of left knee, current, unspecified tear type, subsequent encounter    Plan: Impression is chronic left knee pain with lateral meniscus tear with associated concomitant parameniscal cyst.  Through shared decision making, we did aspirate and inject this parameniscal cyst under ultrasound guidance which yielded 2 cc of a viscous clear substance.  Advised modified rest and activity over the next 48 to 72 hours, we did apply a Ace compression wrap, she may keep this on the knee to help reaccumulation of swelling. She may continue either her meloxicam or over-the-counter anti-inflammatory/NSAIDs as well.  I would like to see her back in about 1 month for reevaluation.  Next steps may include: further PT (+/- one true IA-injection), referral for surgical excision and meniscal  treatment  Follow-up: Return in about 1 month (around 08/07/2023).   Meds & Orders: No orders of the defined types were placed in this encounter.   Orders Placed This Encounter  Procedures   Large Joint Inj: L knee   US Guided Needle Placement - No Linked Charges     Procedures: Large Joint Inj: L knee on 07/07/2023 1:06 PM Indications: pain and joint swelling Details: 18 G 1.5 in needle, ultrasound-guided anterolateral approach Medications: 1 mL lidocaine 1 %; 1 mL bupivacaine 0.25 %; 40 mg methylPREDNISolone acetate 40 MG/ML Aspirate: clear and yellow Outcome: tolerated well, no immediate complications  US-guided Knee Aspiration, left: After discussion on risks/benefits/indications was provided, informed verbal consent was obtained and a timeout was performed, patient was lying supine on exam table. The knee was prepped with chloraprep and alcohol swabs. Utilizing ultrasound-guidance and an anterolateral approach, approximately 4 mL of lidocaine 1% was used for local anesthesia. Then after identification under ultrasound of the lateral knee joint and parameniscal cyst, an 18g needle on 10cc syringe was inserted and approximately 2 mL of clear viscous-like fluid was aspirated from the knee. Utilizing the same portal, the knee joint was then injected with 1:1:1 of lidocaine:bupivicaine:methylprednisolone.  Patient tolerated procedure well without immediate complications.  Procedure, treatment alternatives, risks and benefits explained, specific risks discussed. Consent was given by the patient. Immediately prior to procedure a time out was called to verify the correct patient, procedure, equipment, support staff and site/side marked as required. Patient was prepped and draped in the  usual sterile fashion.            Clinical History: No specialty comments available.  She reports that she has been smoking cigarettes. She uses smokeless tobacco.  Recent Labs    10/28/22 0850  HGBA1C  5.3    Objective:    Physical Exam  Gen: Well-appearing, in no acute distress; non-toxic CV: Well-perfused. Warm.  Resp: Breathing unlabored on room air; no wheezing. Psych: Fluid speech in conversation; appropriate affect; normal thought process Neuro: Sensation intact throughout. No gross coordination deficits.   Ortho Exam - Left knee: There is a trace effusion on the knee.  Over the lateral joint line there is a soft tissue prominence and swelling with crepitus with flexion and extension.  There is some pain with patellar compression as well.  There is positive McMurray's over the lateral aspect of the knee.  Imaging:  MR Knee Left w/o contrast CLINICAL DATA:  Knee pain, chronic. Swelling with tenderness, increasing with walking and standing. No known injury.  EXAM: MRI OF THE LEFT KNEE WITHOUT CONTRAST  TECHNIQUE: Multiplanar, multisequence MR imaging of the knee was performed. No intravenous contrast was administered.  COMPARISON:  Radiographs 05/20/2023  FINDINGS: MENISCI  Medial meniscus:  Intact with normal morphology.  Lateral meniscus: Degenerative tearing of the anterior horn and body with a nearly horizontal component extending into the meniscal body, best seen on the coronal images. Peripheral to the meniscal body, there is a complex cystic lesion at the joint line, between the iliotibial band and fibular collateral ligament which measures approximately 3.1 x 1.4 x 2.3 cm, suspicious for a parameniscal cyst with internal debris. No centrally displaced meniscal fragments.  LIGAMENTS  Cruciates: The anterior and posterior cruciate ligaments are intact.  Collaterals: The medial and lateral collateral ligament complexes are intact.  CARTILAGE  Patellofemoral: Moderate patellofemoral degenerative changes for age with diffuse chondral thinning, surface irregularity and subchondral cyst formation in the lateral patellar facet and central  trochlea.  Medial: Moderate chondral thinning and surface irregular with near full-thickness thinning over the central aspect of the medial femoral condyle.  Lateral: Moderate chondral thinning and surface irregularity without full-thickness defect.  MISCELLANEOUS  Joint:  Small joint effusion.  Popliteal Fossa: The popliteus muscle and tendon are intact. No significant Baker's cyst.  Extensor Mechanism: The visualized quadriceps and patellar tendons are intact.  Bones:  No acute or significant extra-articular osseous findings.  Other: No other significant periarticular soft tissue findings.  IMPRESSION: 1. Degenerative tearing of the anterior horn and body of the lateral meniscus with suspected lateral parameniscal cyst formation. 2. The medial meniscus, cruciate and collateral ligaments are intact. 3. 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 No acute osseous findings.  Electronically Signed   By: Carey Bullocks M.D.   On: 06/23/2023 10:23   Past Medical/Family/Surgical/Social History: Medications & Allergies reviewed per EMR, new medications updated. 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   Fibroids 01/22/2018   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/28/2017   Unilateral primary osteoarthritis, left hip 11/06/2017   Unilateral primary osteoarthritis, right hip 11/06/2017   Lumbosacral radiculopathy at S1 09/12/2017   Back pain 08/19/2017   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   Abnormal gait 08/19/2017   Obesity 08/19/2017   Ventral hernia s/p laparoscopic repair with mesh 02/27/15 02/27/2015   Past Medical History:  Diagnosis Date   Anemia  on meds   Arthritis    generalized   Back pain    lower back    Blood transfusion without reported diagnosis 2020   Complication of anesthesia    age 26 had twilight  anesthesia and woke up during surgery    Depression    on meds   Fatty liver 01/22/2016   Noted on MRI abd   Fibroids    Heavy periods    History of gallstones    History of thyroid nodule    Left   Hypertension    on meds   Irregular periods    Obesity    Pancreatic lesion    Pinched nerve    in back/sciatica   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   Ventral hernia 12/26/2014   Midline noted on CT Abd pelvis   Family History  Problem Relation Age of Onset   Hypertension Father    Arthritis Father    Clotting disorder Father    Prostate cancer Paternal Grandfather    Colon polyps Neg Hx    Colon cancer Neg Hx    Esophageal cancer Neg Hx    Rectal cancer Neg Hx    Stomach cancer Neg Hx    Past Surgical History:  Procedure Laterality Date   benign breast cyst removed Left    age 24   CHOLECYSTECTOMY     INSERTION OF MESH N/A 02/27/2015   Procedure: INSERTION OF MESH;  Surgeon: Avel Peace, MD;  Location: [facility];  Service: General;  Laterality: N/A;   IR ANGIOGRAM PELVIS SELECTIVE OR SUPRASELECTIVE  12/22/2018   IR ANGIOGRAM PELVIS SELECTIVE OR SUPRASELECTIVE  12/22/2018   IR ANGIOGRAM SELECTIVE EACH ADDITIONAL VESSEL  12/22/2018   IR ANGIOGRAM SELECTIVE EACH ADDITIONAL VESSEL  12/22/2018   IR EMBO TUMOR ORGAN ISCHEMIA INFARCT INC GUIDE ROADMAPPING  12/22/2018   IR RADIOLOGIST EVAL & MGMT  09/17/2018   IR RADIOLOGIST EVAL & MGMT  01/05/2019   IR RADIOLOGIST EVAL & MGMT  10/06/2019   IR US GUIDE VASC ACCESS RIGHT  12/22/2018   LAPAROSCOPIC ASSISTED VENTRAL HERNIA REPAIR N/A 02/27/2015   Procedure: LAPAROSCOPIC VENTRAL HERNIA REPAIR WITH MESH;  Surgeon: Avel Peace, MD;  Location: [facility];  Service: General;  Laterality: N/A;   THYROIDECTOMY, PARTIAL     TOTAL HIP ARTHROPLASTY Right 11/28/2017   Procedure: RIGHT TOTAL HIP ARTHROPLASTY ANTERIOR APPROACH;  Surgeon: Kathryne Hitch, MD;  Location: [facility];  Service: Orthopedics;  Laterality: Right;   TOTAL  HIP ARTHROPLASTY Left 07/31/2018   Procedure: LEFT TOTAL HIP ARTHROPLASTY ANTERIOR APPROACH;  Surgeon: Kathryne Hitch, MD;  Location: [facility];  Service: Orthopedics;  Laterality: Left;  Failed Spinal   Social History   Occupational History   Not on file  Tobacco Use   Smoking status: Some Days    Current packs/day: 0.00    Types: Cigarettes    Last attempt to quit: 06/14/2017    Years since quitting: 6.0   Smokeless tobacco: Current   Tobacco comments:    Pt states she does vape and does smoke occasionally Tay 02/08/22  Vaping Use   Vaping status: Never Used  Substance and Sexual Activity   Alcohol use: Yes    Alcohol/week: 4.0 standard drinks of alcohol    Types: 4 Standard drinks or equivalent per week   Drug use: Yes    Frequency: 2.0 times per week    Types: Marijuana   Sexual activity:  Yes    Birth control/protection: Pill    Comment: continuous OCP

## 2023-07-15 ENCOUNTER — Other Ambulatory Visit: Payer: Self-pay

## 2023-07-15 ENCOUNTER — Ambulatory Visit: Payer: BC Managed Care – PPO | Admitting: Internal Medicine

## 2023-07-15 ENCOUNTER — Encounter: Payer: Self-pay | Admitting: Internal Medicine

## 2023-07-15 VITALS — BP 120/76 | HR 72 | Temp 98.6°F | Ht 68.19 in | Wt 275.7 lb

## 2023-07-15 DIAGNOSIS — J393 Upper respiratory tract hypersensitivity reaction, site unspecified: Secondary | ICD-10-CM | POA: Diagnosis not present

## 2023-07-15 DIAGNOSIS — J3089 Other allergic rhinitis: Secondary | ICD-10-CM

## 2023-07-15 DIAGNOSIS — T781XXA Other adverse food reactions, not elsewhere classified, initial encounter: Secondary | ICD-10-CM

## 2023-07-15 NOTE — Progress Notes (Signed)
 NEW PATIENT  Date of Service/Encounter:  07/15/23  Consult requested by: Mercer Clotilda SAUNDERS, MD   Subjective:   Rachel Warren (DOB: 09-Jun-1976) is a 47 y.o. female who presents to the clinic on 07/15/2023 with a chief complaint of Allergies (Possible food allergies to seafood) .    History obtained from: chart review and patient.     Rhinitis:  Started a few years ago.   Symptoms include: nasal congestion, rhinorrhea, and post nasal drainage  Occurs seasonally-spring/summer  Potential triggers: not sure   Treatments tried:  Claritin PRN  Previous allergy  testing: no History of sinus surgery: no Nonallergic triggers: none   Concern for Food Allergy :  Foods of concern: crab   History of reaction:  Crab- noted felt like throat felt funny like there was phlegm there.  Also noted tongue itching, no swelling.  This recurred another time she ate the crab. Doesn't happen with shrimp though  Similar issue with snickers, walnuts, cashews, pecans but no issue with peanut butter and jelly sandwich and almonds.   Previous allergy  testing no  Carries an epinephrine  autoinjector: yes    Reviewed:  06/23/2023: seen by Dr Mercer for itchy tongue/throat after eating peanuts, walnuts, pecans, lobster, more than one crab.  No issues with shrimp/almonds. Discussed avoidance of shellfish/nuts, Epipen  and referral to Allergy .   02/08/2022: seen by Dr Annella Pulm for SOB/gasping.  Physiological due to atelectasis and weight gain.  Stiolto without improvement discussed stopping.  Concern for rhinitis, discussed use of INCS and anti histamines.   11/09/2021: seen by Dr Annella for SOB, start Stiolto.  Consider imaging.   Past Medical History: Past Medical History:  Diagnosis Date   Anemia    on meds   Arthritis    generalized   Back pain    lower back    Blood transfusion without reported diagnosis 2020   Complication of anesthesia    age 35 had twilight anesthesia  and woke up during surgery    Depression    on meds   Fatty liver 01/22/2016   Noted on MRI abd   Fibroids    Heavy periods    History of gallstones    History of thyroid  nodule    Left   Hypertension    on meds   Irregular periods    Obesity    Pancreatic lesion    Pinched nerve    in back/sciatica   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   Ventral hernia 12/26/2014   Midline noted on CT Abd pelvis     Past Surgical History: Past Surgical History:  Procedure Laterality Date   benign breast cyst removed Left    age 65   CHOLECYSTECTOMY     INSERTION OF MESH N/A 02/27/2015   Procedure: INSERTION OF MESH;  Surgeon: Krystal Russell, MD;  Location: [facility];  Service: General;  Laterality: N/A;   IR ANGIOGRAM PELVIS SELECTIVE OR SUPRASELECTIVE  12/22/2018   IR ANGIOGRAM PELVIS SELECTIVE OR SUPRASELECTIVE  12/22/2018   IR ANGIOGRAM SELECTIVE EACH ADDITIONAL VESSEL  12/22/2018   IR ANGIOGRAM SELECTIVE EACH ADDITIONAL VESSEL  12/22/2018   IR EMBO TUMOR ORGAN ISCHEMIA INFARCT INC GUIDE ROADMAPPING  12/22/2018   IR RADIOLOGIST EVAL & MGMT  09/17/2018   IR RADIOLOGIST EVAL & MGMT  01/05/2019   IR RADIOLOGIST EVAL & MGMT  10/06/2019   IR US  GUIDE VASC ACCESS RIGHT  12/22/2018   LAPAROSCOPIC ASSISTED VENTRAL HERNIA REPAIR  N/A 02/27/2015   Procedure: LAPAROSCOPIC VENTRAL HERNIA REPAIR WITH MESH;  Surgeon: Krystal Russell, MD;  Location: [facility];  Service: General;  Laterality: N/A;   THYROIDECTOMY, PARTIAL     TOTAL HIP ARTHROPLASTY Right 11/28/2017   Procedure: RIGHT TOTAL HIP ARTHROPLASTY ANTERIOR APPROACH;  Surgeon: Vernetta Lonni GRADE, MD;  Location: [facility];  Service: Orthopedics;  Laterality: Right;   TOTAL HIP ARTHROPLASTY Left 07/31/2018   Procedure: LEFT TOTAL HIP ARTHROPLASTY ANTERIOR APPROACH;  Surgeon: Vernetta Lonni GRADE, MD;  Location: [facility];  Service: Orthopedics;  Laterality: Left;  Failed Spinal    Family History: Family History  Problem Relation Age of Onset    Hypertension Father    Arthritis Father    Clotting disorder Father    Prostate cancer Paternal Grandfather    Colon polyps Neg Hx    Colon cancer Neg Hx    Esophageal cancer Neg Hx    Rectal cancer Neg Hx    Stomach cancer Neg Hx     Social History:  Flooring in bedroom: carpet Pets: none Tobacco use/exposure: none Job: teacher   Medication List:  Allergies as of 07/15/2023   No Known Allergies      Medication List        Accurate as of July 15, 2023  2:21 PM. If you have any questions, ask your nurse or doctor.          amLODipine  10 MG tablet Commonly known as: NORVASC  Take 1 tablet (10 mg total) by mouth daily.   diclofenac  sodium 1 % Gel Commonly known as: Voltaren  Apply 2 g topically 4 (four) times daily. What changed:  when to take this reasons to take this   DULoxetine  30 MG capsule Commonly known as: CYMBALTA  Take 1 capsule (30 mg total) by mouth 2 (two) times daily.   EPINEPHrine  0.3 mg/0.3 mL Soaj injection Commonly known as: EPI-PEN Inject 0.3 mg into the muscle as needed for anaphylaxis.   FeroSul 325 (65 Fe) MG tablet Generic drug: ferrous sulfate  Take 1 tablet by mouth once daily   gabapentin  300 MG capsule Commonly known as: NEURONTIN  Take 1 capsule (300 mg total) by mouth 3 (three) times daily.   Lo Loestrin Fe 1 MG-10 MCG / 10 MCG tablet Generic drug: Norethindrone -Ethinyl Estradiol-Fe Biphas   meloxicam  15 MG tablet Commonly known as: MOBIC  Take 1 tablet (15 mg total) by mouth daily.   naproxen sodium 220 MG tablet Commonly known as: ALEVE Take 220 mg by mouth as needed (pain/headache).         REVIEW OF SYSTEMS: Pertinent positives and negatives discussed in HPI.   Objective:   Physical Exam: BP 120/76 (BP Location: Left Arm, Patient Position: Sitting, Cuff Size: Large)   Pulse 72   Temp 98.6 F (37 C)   Ht 5' 8.19 (1.732 m)   Wt 275 lb 11.2 oz (125.1 kg)   SpO2 97%   BMI 41.69 kg/m  Body mass index  is 41.69 kg/m. GEN: alert, well developed HEENT: clear conjunctiva, nose with + mild inferior turbinate hypertrophy, pink nasal mucosa, no rhinorrhea, no cobblestoning HEART: regular rate and rhythm, no murmur LUNGS: clear to auscultation bilaterally, no coughing, unlabored respiration ABDOMEN: soft, non distended  SKIN: no rashes or lesions  Assessment:   1. Other allergic rhinitis   2. Adverse food reaction, initial encounter   3. Upper respiratory tract hypersensitivity reaction     Plan/Recommendations:  Other Allergic Rhinitis: - Due to turbinate hypertrophy, seasonal symptoms and  unresponsive to over the counter meds, will perform skin testing to identify aeroallergen triggers.   - Use nasal saline rinses such as with Neilmed Sinus Rinse as needed.  Use distilled water .   - Use Claritin 10mg  daily as needed for runny nose, sneezing, itchy watery eyes.  - Hold all anti-histamines (Xyzal, Allegra, Zyrtec , Claritin, Benadryl ) 3 days prior to next visit.   Food Allergy :  - please strictly avoid shellfish and nuts for now.  Will obtain testing (skin, possibly bloodwork) at next visit.  - Initial rxn: crabs with throat feeling funny and tongue itching but does fine with shrimp; snickers/walnuts/cashews/pecans similar with mouth/tongue itching and funny throat feeling.  - for SKIN only reaction, okay to take Benadryl  25mg  capsules every 6 hours as needed  Follow up: 1/7 at 3 PM for skin testing 1-55, shellfish, peanut, treenuts    Arleta Blanch, MD [facility] 

## 2023-07-15 NOTE — Patient Instructions (Addendum)
 Other Allergic Rhinitis:  - Use nasal saline rinses such as with Neilmed Sinus Rinse as needed.  Use distilled water .   - Use Claritin 10mg  daily as needed for runny nose, sneezing, itchy watery eyes.  - Hold all anti-histamines (Xyzal, Allegra, Zyrtec , Claritin, Benadryl ) 3 days prior to next visit.   Food Allergy :  - please strictly avoid shellfish and nuts for now.  Will obtain testing (skin, possibly bloodwork) at next visit.   Follow up: 1/7 at 3 PM for skin testing 1-55, shellfish, peanut, treenuts

## 2023-07-17 ENCOUNTER — Ambulatory Visit: Payer: 59 | Admitting: Physical Therapy

## 2023-07-17 ENCOUNTER — Encounter: Payer: Self-pay | Admitting: Physical Therapy

## 2023-07-17 DIAGNOSIS — M6281 Muscle weakness (generalized): Secondary | ICD-10-CM

## 2023-07-17 DIAGNOSIS — R2689 Other abnormalities of gait and mobility: Secondary | ICD-10-CM | POA: Diagnosis not present

## 2023-07-17 DIAGNOSIS — G8929 Other chronic pain: Secondary | ICD-10-CM | POA: Diagnosis not present

## 2023-07-17 DIAGNOSIS — M25562 Pain in left knee: Secondary | ICD-10-CM

## 2023-07-17 NOTE — Therapy (Signed)
 OUTPATIENT PHYSICAL THERAPY TREATMENT   Patient Name: Rachel Warren MRN: [id number] DOB:Jan 02, 1976, 48 y.o., female Today's Date: 07/17/2023  END OF SESSION:  PT End of Session - 07/17/23 1305     Visit Number 3    Number of Visits 4    Date for PT Re-Evaluation 07/31/23    Authorization Type [employer] $52 copay    PT Start Time 1305    PT Stop Time 1343    PT Time Calculation (min) 38 min    Activity Tolerance Patient tolerated treatment well    Behavior During Therapy WFL for tasks assessed/performed               Past Medical History:  Diagnosis Date   Anemia    on meds   Arthritis    generalized   Back pain    lower back    Blood transfusion without reported diagnosis 2020   Complication of anesthesia    age 64 had twilight anesthesia and woke up during surgery    Depression    on meds   Fatty liver 01/22/2016   Noted on MRI abd   Fibroids    Heavy periods    History of gallstones    History of thyroid  nodule    Left   Hypertension    on meds   Irregular periods    Obesity    Pancreatic lesion    Pinched nerve    in back/sciatica   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   Ventral hernia 12/26/2014   Midline noted on CT Abd pelvis   Past Surgical History:  Procedure Laterality Date   benign breast cyst removed Left    age 76   CHOLECYSTECTOMY     INSERTION OF MESH N/A 02/27/2015   Procedure: INSERTION OF MESH;  Surgeon: Krystal Russell, MD;  Location: [facility];  Service: General;  Laterality: N/A;   IR ANGIOGRAM PELVIS SELECTIVE OR SUPRASELECTIVE  12/22/2018   IR ANGIOGRAM PELVIS SELECTIVE OR SUPRASELECTIVE  12/22/2018   IR ANGIOGRAM SELECTIVE EACH ADDITIONAL VESSEL  12/22/2018   IR ANGIOGRAM SELECTIVE EACH ADDITIONAL VESSEL  12/22/2018   IR EMBO TUMOR ORGAN ISCHEMIA INFARCT INC GUIDE ROADMAPPING  12/22/2018   IR RADIOLOGIST EVAL & MGMT  09/17/2018   IR RADIOLOGIST EVAL & MGMT  01/05/2019   IR RADIOLOGIST EVAL & MGMT  10/06/2019   IR US   GUIDE VASC ACCESS RIGHT  12/22/2018   LAPAROSCOPIC ASSISTED VENTRAL HERNIA REPAIR N/A 02/27/2015   Procedure: LAPAROSCOPIC VENTRAL HERNIA REPAIR WITH MESH;  Surgeon: Krystal Russell, MD;  Location: [facility];  Service: General;  Laterality: N/A;   THYROIDECTOMY, PARTIAL     TOTAL HIP ARTHROPLASTY Right 11/28/2017   Procedure: RIGHT TOTAL HIP ARTHROPLASTY ANTERIOR APPROACH;  Surgeon: Vernetta Lonni GRADE, MD;  Location: [facility];  Service: Orthopedics;  Laterality: Right;   TOTAL HIP ARTHROPLASTY Left 07/31/2018   Procedure: LEFT TOTAL HIP ARTHROPLASTY ANTERIOR APPROACH;  Surgeon: Vernetta Lonni GRADE, MD;  Location: [facility];  Service: Orthopedics;  Laterality: Left;  Failed Spinal   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   Fibroids 01/22/2018   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/28/2017   Unilateral primary osteoarthritis, left hip 11/06/2017   Unilateral primary osteoarthritis, right hip 11/06/2017   Lumbosacral radiculopathy at S1 09/12/2017   Back pain 08/19/2017  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   Abnormal gait 08/19/2017   Obesity 08/19/2017   Ventral hernia s/p laparoscopic repair with mesh 02/27/15 02/27/2015    PCP: Mercer Clotilda SAUNDERS, MD  REFERRING PROVIDER: Burnetta Brunet, DO  REFERRING DIAG: [phone] (ICD-10-CM) - Chronic pain of left knee M17.12 (ICD-10-CM) - Patellofemoral arthritis of left knee M23.007 (ICD-10-CM) - Perimeniscal cyst of left knee  Rationale for Evaluation and Treatment: Rehabilitation  THERAPY DIAG:  Chronic pain of left knee  Muscle weakness (generalized)  Other abnormalities of gait and mobility  ONSET DATE: 2-3 months ago   SUBJECTIVE:                                                                                                                                                                                            SUBJECTIVE STATEMENT: Had fluid drained last week which was an instant relief.  Was doing a lot in the classroom earlier this week and knee is swollen again.   PERTINENT HISTORY:  Anemia, arthritis, depression, obesity, bil THA  PAIN:  Are you having pain? Yes: NPRS scale: 4 currently, up to 4, at best 1-2/10 Pain location: Lt knee Pain description: electric shocks, stiffness  Aggravating factors: prolonged positioning, standing, walking, squatting Relieving factors: medication  PRECAUTIONS:  None  RED FLAGS: None   WEIGHT BEARING RESTRICTIONS:  No  FALLS:  Has patient fallen in last 6 months? 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; will be moving into new school with stairs in Jan  PLOF:  Independent and Leisure: gardening, swimming, movies, was going to the gym but stopped when she was having hip pain; still has gym membership  PATIENT GOALS:  Improve pain   OBJECTIVE:   DIAGNOSTIC FINDINGS:  MRI scheduled 06/08/23  PATIENT SURVEYS:  06/05/23 FOTO 36 (predicted 56)  COGNITIVE STATUS: Within functional limits for tasks assessed   SENSATION: WFL  POSTURE:  No Significant postural limitations  GAIT: 06/05/23 Comments: amb mod I with SPC; antalgic gait noted   EDEMA: 06/05/23: mild swelling noted in Lt knee   LOWER EXTREMITY ROM:     ROM Right eval Left eval  Knee flexion  A: 125  Knee extension A: 8 A: -3   (Blank rows = not tested)   LOWER EXTREMITY MMT:    MMT Right eval Left eval  Hip flexion    Hip extension  3/5  Hip abduction  3/5  Hip adduction    Hip internal rotation    Hip  external rotation    Knee flexion 5/5 5/5  Knee extension 5/5 4/5  with pain   (Blank rows = not tested)      TREATMENT:                                                                                                                               DATE:  07/17/23 TherEx NuStep L5 x 6 min Leg press bil 100# x 10; single limb 50# x 10 bil Knee extension machine 10# 2x10 Hamstring curls 35# 2x10 Discussed gym program and pool exercises - handouts provided   06/19/23 TherEx NuStep L5 x 6 min Seated Lt SLR x 10 reps Bridges x10 reps Sidelying hip circles x 10 reps each direction; LLE Sidelying LLE hip extension x 10 reps  Manual IASTM with percussive device x 3 min to Lt quad   06/05/23 See HEP - demonstrated with trial reps performed PRN for comprehension     PATIENT EDUCATION:  Education details: HEP Person educated: Patient Education method: Programmer, Multimedia, Demonstration, and Handouts Education comprehension: verbalized understanding, returned demonstration, and needs further education  HOME EXERCISE PROGRAM: Access Code: [email or web address] URL: [email or web address] Date: 07/17/2023 Prepared by: Corean Ku  Exercises - Seated Straight Leg Raise  - 2 x daily - 7 x weekly - 2 sets - 10 reps - Supine Bridge  - 2 x daily - 7 x weekly - 2 sets - 10 reps - 5 sec hold - Sidelying Hip Circles  - 2 x daily - 7 x weekly - 2 sets - 10 circles each way - Standing Hip Abduction Adduction at Pool Wall  - 1 x daily - 7 x weekly - 3 sets - 10 reps - Standing Hip Flexion Extension at [employer]  - 1 x daily - 7 x weekly - 3 sets - 10 reps - Standing March at [facility]  - 1 x daily - 7 x weekly - 3 sets - 10 reps - Side Stepping  - 1 x daily - 7 x weekly - 3 sets - 10 reps - Squat  - 1 x daily - 7 x weekly - 3 sets - 10 reps - Standing Hip Circles at [employer]  - 1 x daily - 7 x weekly - 3 sets - 10 reps - Heel Toe Raises at Pool Wall  - 1 x daily - 7 x weekly - 3 sets - 10 reps - Alternating Forward Lunge  - 1 x daily - 7 x weekly - 3 sets - 10 reps - Single Leg Press  - 1 x daily - 7 x weekly - 3 sets - 10 reps - Knee Extension with Weight Machine  - 1 x  daily - 7 x weekly - 3 sets - 10 reps - Hamstring Curl with Weight Machine  - 1 x daily - 7 x weekly - 3 sets - 10 reps - Hip Adduction Machine  - 1 x daily -  7 x weekly - 3 sets - 10 reps - Hip Abduction Machine  - 1 x daily - 7 x weekly - 3 sets - 10 reps   ASSESSMENT:  CLINICAL IMPRESSION: Pt tolerated session well today with addition of new exercises to perform at the gym or pool when she can access one.  Anticipate we are nearing d/c.  Will continue to benefit from PT to maximize function.   OBJECTIVE IMPAIRMENTS: Abnormal gait, difficulty walking, decreased ROM, decreased strength, increased edema, and pain.   ACTIVITY LIMITATIONS: carrying, bending, sitting, standing, squatting, stairs, transfers, and locomotion level  PARTICIPATION LIMITATIONS: meal prep, cleaning, laundry, shopping, community activity, and occupation  PERSONAL FACTORS: 3+ comorbidities: Anemia, arthritis, depression, obesity, bil THA  are also affecting patient's functional outcome.   REHAB POTENTIAL: Good  CLINICAL DECISION MAKING: Evolving/moderate complexity  EVALUATION COMPLEXITY: Moderate   GOALS: Goals reviewed with patient? Yes  SHORT TERM GOALS: Target date: 07/03/2023  Independent with initial HEP Goal status: MET 07/17/23    LONG TERM GOALS: Target date: 07/31/2023  Independent with final HEP Goal status: INITIAL  2.  FOTO score improved to 56 Goal status: INITIAL  3.  LLE strength improved to 4+/5 for improved function and mobility Goal status: INIITAL  4.  Report pain < 3/10 with standing and walking for improved function Goal status: INITIAL  PLAN:  PT FREQUENCY: every other week  PT DURATION: 8 weeks  PLANNED INTERVENTIONS: 97164- PT Re-evaluation, 97110-Therapeutic exercises, 97530- Therapeutic activity, 97112- Neuromuscular re-education, 97535- Self Care, 02859- Manual therapy, [id number]- Gait training, [id number]- Aquatic Therapy, 97014- Electrical stimulation (unattended),  97016- Vasopneumatic device, [id number]- Ultrasound, [id number]- Ionotophoresis 4mg /ml Dexamethasone , Patient/Family education, Balance training, Stair training, Taping, Dry Needling, Cryotherapy, and Moist heat.  PLAN FOR NEXT SESSION: how are new exercises, possible d/c? r  NEXT MD VISIT: 08/04/23   Corean JULIANNA Ku, PT, DPT 07/17/23 1:46 PM

## 2023-07-22 ENCOUNTER — Ambulatory Visit (INDEPENDENT_AMBULATORY_CARE_PROVIDER_SITE_OTHER): Payer: 59 | Admitting: Internal Medicine

## 2023-07-22 DIAGNOSIS — J3089 Other allergic rhinitis: Secondary | ICD-10-CM

## 2023-07-22 DIAGNOSIS — J393 Upper respiratory tract hypersensitivity reaction, site unspecified: Secondary | ICD-10-CM | POA: Diagnosis not present

## 2023-07-22 MED ORDER — CETIRIZINE HCL 10 MG PO TABS: 10.0000 mg | ORAL_TABLET | Freq: Every day | ORAL | 5 refills | Status: DC | PRN

## 2023-07-22 MED ORDER — AZELASTINE HCL 0.1 % NA SOLN: 2.0000 | Freq: Two times a day (BID) | NASAL | 5 refills | Status: DC | PRN

## 2023-07-22 NOTE — Progress Notes (Signed)
 FOLLOW UP Date of Service/Encounter:  07/22/23   Subjective:  Rachel Warren (DOB: 1976-01-13) is a 48 y.o. female who [facility] for follow up for skin testing.   History obtained from: chart review and patient.  Anti histamines held.   Past Medical History: Past Medical History:  Diagnosis Date   Anemia    on meds   Arthritis    generalized   Back pain    lower back    Blood transfusion without reported diagnosis 2020   Complication of anesthesia    age 59 had twilight anesthesia and woke up during surgery    Depression    on meds   Fatty liver 01/22/2016   Noted on MRI abd   Fibroids    Heavy periods    History of gallstones    History of thyroid  nodule    Left   Hypertension    on meds   Irregular periods    Obesity    Pancreatic lesion    Pinched nerve    in back/sciatica   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   Ventral hernia 12/26/2014   Midline noted on CT Abd pelvis    Objective:  There were no vitals taken for this visit. There is no height or weight on file to calculate BMI. Physical Exam: GEN: alert, well developed HEENT: clear conjunctiva, MMM LUNGS: unlabored respiration  Skin Testing:  Skin prick testing was placed, which includes aeroallergens/foods, histamine control, and saline control.  Verbal consent was obtained prior to placing test.  Patient tolerated procedure well.  Allergy  testing results were read and interpreted by myself, documented by clinical staff. Adequate positive and negative control. Does have some degree of dermatographia making it difficult to interpret.   Positive results to:  Results discussed with patient/family.  Airborne Adult Perc - 07/22/23 1400     Time Antigen Placed 1455    Allergen Manufacturer Jestine    Location Back    Number of Test 55    1. Control-Buffer 50% Glycerol Negative    2. Control-Histamine 3+    3. Bahia Negative     4. Bermuda Negative    5. Johnson Negative    6. [place]  Blue Negative    7. Meadow Fescue Negative    8. Perennial Rye Negative    9. Timothy Negative    10. Ragweed Mix Negative    11. Cocklebur Negative    12. Plantain,  English Negative    13. Baccharis Negative    14. Dog Fennel Negative    15. Russian Thistle Negative    16. Lamb's Quarters Negative    17. Sheep Sorrell Negative    18. Rough Pigweed Negative    19. Marsh Elder, Rough Negative    20. Mugwort, Common Negative    21. Box, Elder Negative    22. Cedar, red Negative    23. Sweet Gum Negative    24. Pecan Pollen Negative    25. Pine Mix Negative    26. Walnut, Black Pollen Negative    27. Red Mulberry Negative    28. Ash Mix Negative    29. Birch Mix Negative    30. Beech American Negative    31. Cottonwood, Eastern Negative    32. Hickory, White Negative    33. Maple Mix Negative    34. Oak, Eastern Mix Negative    35. Sycamore Eastern  Negative    36. Alternaria Alternata Negative    37. Cladosporium Herbarum Negative    38. Aspergillus Mix Negative    39. Penicillium Mix Negative    40. Bipolaris Sorokiniana (Helminthosporium) Negative    41. Drechslera Spicifera (Curvularia) Negative    42. Mucor Plumbeus Negative    43. Fusarium Moniliforme Negative    44. Aureobasidium Pullulans (pullulara) Negative    45. Rhizopus Oryzae Negative    46. Botrytis Cinera Negative    47. Epicoccum Nigrum Negative    48. Phoma Betae Negative    49. Dust Mite Mix 3+    50. Cat Hair 10,000 BAU/ml Negative    51.  Dog Epithelia Negative    52. Mixed Feathers Negative    53. Horse Epithelia Negative    54. Cockroach, German Negative    55. Tobacco Leaf Negative             Food Adult Perc - 07/22/23 1400     Time Antigen Placed 1456    Allergen Manufacturer Jestine    Location Back    Number of allergen test 14    1. Peanut Negative    8. Shellfish Mix Negative    10. Cashew Negative    11. Walnut Food  Negative    12. Almond Negative    13. Hazelnut Negative    14. Pecan Food Negative    15. Pistachio Negative    16. Brazil Nut Negative    23. Shrimp Negative    24. Crab Negative    25. Lobster Negative    26. Oyster Negative    27. Scallops Negative              Assessment:   1. Allergic rhinitis due to dust mite   2. Upper respiratory tract hypersensitivity reaction     Plan/Recommendations:  Allergic Rhinitis:  - Due to turbinate hypertrophy, seasonal symptoms and unresponsive to over the counter meds, will perform skin testing to identify aeroallergen triggers.   - Positive skin test 07/2023: dust mites  - Avoidance measures discussed. - Use nasal saline rinses before nose sprays such as with Neilmed Sinus Rinse.  Use distilled water .   - Use Azelastine  1-2 sprays each nostril twice daily as needed for runny nose, drainage, sneezing, congestion. Aim upward and outward. - Use Zyrtec  10mg  daily as needed for runny nose, sneezing, itchy watery eyes.   Food Allergy :  - please strictly avoid shellfish and nuts for now.   - SPT 07/2023: negative to nuts and shellfish.  Will obtain testing blood testing.  - Initial rxn: crabs with throat feeling funny and tongue itching but does fine with shrimp; snickers/walnuts/cashews/pecans similar with mouth/tongue itching and funny throat feeling.  - for SKIN only reaction, okay to take Benadryl  25mg  capsules every 6 hours as needed ALLERGEN AVOIDANCE MEASURES   Dust Mites Use central air conditioning and heat; and change the filter monthly.  Pleated filters work better than mesh filters.  Electrostatic filters may also be used; wash the filter monthly.  Window air conditioners may be used, but do not clean the air as well as a central air conditioner.  Change or wash the filter monthly. Keep windows closed.  Do not use attic fans.   Encase the mattress, box springs and pillows with zippered, dust proof covers. Wash the bed linens in  hot water  weekly.   Remove carpet, especially from the bedroom. Remove stuffed animals, throw pillows, dust ruffles, heavy drapes and  other items that collect dust from the bedroom. Do not use a humidifier.   Use wood, vinyl or leather furniture instead of cloth furniture in the bedroom. Keep the indoor humidity at 30 - 40%.  .       Return in about 3 months (around 10/20/2023).  Arleta Blanch, MD [facility] 

## 2023-07-22 NOTE — Patient Instructions (Addendum)
 Allergic Rhinitis:  - Positive skin test 07/2023: dust mites  - Avoidance measures discussed. - Use nasal saline rinses before nose sprays such as with Neilmed Sinus Rinse.  Use distilled water .   - Use Azelastine  1-2 sprays each nostril twice daily as needed for runny nose, drainage, sneezing, congestion. Aim upward and outward. - Use Zyrtec  10mg  daily as needed for runny nose, sneezing, itchy watery eyes.   Food Allergy :  - please strictly avoid shellfish and nuts for now.   - SPT 07/2023: negative to nuts and shellfish.  Will obtain testing blood testing.   ALLERGEN AVOIDANCE MEASURES   Dust Mites Use central air conditioning and heat; and change the filter monthly.  Pleated filters work better than mesh filters.  Electrostatic filters may also be used; wash the filter monthly.  Window air conditioners may be used, but do not clean the air as well as a central air conditioner.  Change or wash the filter monthly. Keep windows closed.  Do not use attic fans.   Encase the mattress, box springs and pillows with zippered, dust proof covers. Wash the bed linens in hot water  weekly.   Remove carpet, especially from the bedroom. Remove stuffed animals, throw pillows, dust ruffles, heavy drapes and other items that collect dust from the bedroom. Do not use a humidifier.   Use wood, vinyl or leather furniture instead of cloth furniture in the bedroom. Keep the indoor humidity at 30 - 40%.  SABRA

## 2023-07-24 LAB — ALLERGENS W/TOTAL IGE AREA 2
Alternaria Alternata IgE: <0.1 kU/L
Aspergillus Fumigatus IgE: <0.1 kU/L
Bermuda Grass IgE: <0.1 kU/L
Cat Dander IgE: <0.1 kU/L
Cedar, Mountain IgE: <0.1 kU/L
Cladosporium Herbarum IgE: <0.1 kU/L
Cockroach, German IgE: 0.11 kU/L — AB
Common Silver Birch IgE: <0.1 kU/L
Cottonwood IgE: <0.1 kU/L
D Farinae IgE: 2.03 kU/L — AB
D Pteronyssinus IgE: 1.53 kU/L — AB
Dog Dander IgE: <0.1 kU/L
Elm, American IgE: <0.1 kU/L
IgE (Immunoglobulin E), Serum: 409 [IU]/mL (ref 6–495)
Johnson Grass IgE: <0.1 kU/L
Maple/Box Elder IgE: <0.1 kU/L
Mouse Urine IgE: <0.1 kU/L
Oak, White IgE: <0.1 kU/L
Pecan, Hickory IgE: <0.1 kU/L
Penicillium Chrysogen IgE: <0.1 kU/L
Pigweed, Rough IgE: <0.1 kU/L
Ragweed, Short IgE: 1.21 kU/L — AB
Sheep Sorrel IgE Qn: <0.1 kU/L
Timothy Grass IgE: <0.1 kU/L
White Mulberry IgE: <0.1 kU/L

## 2023-07-24 LAB — IGE NUT PROF. W/COMPONENT RFLX
F017-IgE Hazelnut (Filbert): <0.1 kU/L
F018-IgE Brazil Nut: <0.1 kU/L
F020-IgE Almond: <0.1 kU/L
F202-IgE Cashew Nut: <0.1 kU/L
F203-IgE Pistachio Nut: <0.1 kU/L
F256-IgE Walnut: <0.1 kU/L
Macadamia Nut, IgE: <0.1 kU/L
Peanut, IgE: <0.1 kU/L
Pecan Nut IgE: <0.1 kU/L

## 2023-07-24 LAB — ALLERGEN PROFILE, SHELLFISH
Clam IgE: <0.1 kU/L
F023-IgE Crab: <0.1 kU/L
F080-IgE Lobster: <0.1 kU/L
F290-IgE Oyster: <0.1 kU/L
Scallop IgE: <0.1 kU/L
Shrimp IgE: <0.1 kU/L

## 2023-08-04 ENCOUNTER — Encounter: Payer: Self-pay | Admitting: Physical Therapy

## 2023-08-04 ENCOUNTER — Encounter: Payer: Self-pay | Admitting: Sports Medicine

## 2023-08-04 ENCOUNTER — Ambulatory Visit: Payer: 59 | Admitting: Physical Therapy

## 2023-08-04 ENCOUNTER — Ambulatory Visit: Payer: 59 | Admitting: Sports Medicine

## 2023-08-04 DIAGNOSIS — M6281 Muscle weakness (generalized): Secondary | ICD-10-CM | POA: Diagnosis not present

## 2023-08-04 DIAGNOSIS — R600 Localized edema: Secondary | ICD-10-CM

## 2023-08-04 DIAGNOSIS — M25562 Pain in left knee: Secondary | ICD-10-CM

## 2023-08-04 DIAGNOSIS — M1712 Unilateral primary osteoarthritis, left knee: Secondary | ICD-10-CM | POA: Diagnosis not present

## 2023-08-04 DIAGNOSIS — S83282D Other tear of lateral meniscus, current injury, left knee, subsequent encounter: Secondary | ICD-10-CM

## 2023-08-04 DIAGNOSIS — G8929 Other chronic pain: Secondary | ICD-10-CM | POA: Diagnosis not present

## 2023-08-04 DIAGNOSIS — R2689 Other abnormalities of gait and mobility: Secondary | ICD-10-CM | POA: Diagnosis not present

## 2023-08-04 NOTE — Progress Notes (Unsigned)
Rachel Warren - 48 y.o. female MRN [id number]  Date of birth: 10/07/1975  Office Visit Note: Visit Date: 08/04/2023 PCP: Deeann Saint, MD Referred by: Deeann Saint, MD  Subjective: No chief complaint on file.  HPI: Rachel Warren is a pleasant 48 y.o. female who presents today for follow-up of chronic left knee pain with meniscal injury and parameniscal cyst.  We did proceed with ultrasound-guided parameniscal cyst aspiration and subsequent injection on 07/07/2023.  Pertinent ROS were reviewed with the patient and found to be negative unless otherwise specified above in HPI.   Assessment & Plan: Visit Diagnoses: No diagnosis found.  Plan: ***  Follow-up: No follow-ups on file.   Meds & Orders: No orders of the defined types were placed in this encounter.  No orders of the defined types were placed in this encounter.    Procedures: No procedures performed      Clinical History: No specialty comments available.  She reports that she has been smoking cigarettes. She has been exposed to tobacco smoke. She uses smokeless tobacco.  Recent Labs    10/28/22 0850  HGBA1C 5.3    Objective:   Vital Signs: There were no vitals taken for this visit.  Physical Exam  Gen: Well-appearing, in no acute distress; non-toxic CV: Well-perfused. Warm.  Resp: Breathing unlabored on room air; no wheezing. Psych: Fluid speech in conversation; appropriate affect; normal thought process  Ortho Exam - ***  Imaging: No results found.  Past Medical/Family/Surgical/Social History: Medications & Allergies reviewed per EMR, new medications updated. Patient Active Problem List   Diagnosis Date Noted  . Protrusion of lumbar intervertebral disc 05/31/2020  . 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 Fibroids 01/22/2018  . 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 Unilateral primary osteoarthritis, left hip 11/06/2017  . Unilateral  primary osteoarthritis, right hip 11/06/2017  . Lumbosacral radiculopathy at S1 09/12/2017  . Back pain 08/19/2017  . 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  . Abnormal gait 08/19/2017  . Obesity 08/19/2017  . Ventral hernia s/p laparoscopic repair with mesh 02/27/15 02/27/2015   Past Medical History:  Diagnosis Date  . Anemia    on meds  . Arthritis    generalized  . Back pain    lower back   . Blood transfusion without reported diagnosis 2020  . Complication of anesthesia    age 73 had twilight anesthesia and woke up during surgery   . Depression    on meds  . Fatty liver 01/22/2016   Noted on MRI abd  . Fibroids   . Heavy periods   . History of gallstones   . History of thyroid nodule    Left  . Hypertension    on meds  . Irregular periods   . Obesity   . Pancreatic lesion   . Pinched nerve    in back/sciatica  . 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 Ventral hernia 12/26/2014   Midline noted on CT Abd pelvis   Family History  Problem Relation Age of Onset  . Hypertension Father   . Arthritis Father   . Clotting disorder Father   . Prostate cancer Paternal Grandfather   . Colon polyps Neg Hx   . Colon cancer Neg Hx   . Esophageal cancer Neg Hx   . Rectal cancer Neg Hx   . Stomach cancer Neg Hx    Past Surgical History:  Procedure Laterality Date  . benign breast cyst removed Left    age 2  . CHOLECYSTECTOMY    . INSERTION OF MESH N/A 02/27/2015   Procedure: INSERTION OF MESH;  Surgeon: Avel Peace, MD;  Location: [facility];  Service: General;  Laterality: N/A;  . IR ANGIOGRAM PELVIS SELECTIVE OR SUPRASELECTIVE  12/22/2018  . IR ANGIOGRAM PELVIS SELECTIVE OR SUPRASELECTIVE  12/22/2018  . IR ANGIOGRAM SELECTIVE EACH ADDITIONAL VESSEL  12/22/2018  . IR ANGIOGRAM SELECTIVE EACH ADDITIONAL VESSEL  12/22/2018  . IR EMBO TUMOR ORGAN ISCHEMIA INFARCT INC GUIDE ROADMAPPING  12/22/2018  . IR RADIOLOGIST EVAL & MGMT  09/17/2018  . IR RADIOLOGIST EVAL & MGMT   01/05/2019  . IR RADIOLOGIST EVAL & MGMT  10/06/2019  . IR US GUIDE VASC ACCESS RIGHT  12/22/2018  . LAPAROSCOPIC ASSISTED VENTRAL HERNIA REPAIR N/A 02/27/2015   Procedure: LAPAROSCOPIC VENTRAL HERNIA REPAIR WITH MESH;  Surgeon: Avel Peace, MD;  Location: [facility];  Service: General;  Laterality: N/A;  . THYROIDECTOMY, PARTIAL    . TOTAL HIP ARTHROPLASTY Right 11/28/2017   Procedure: RIGHT TOTAL HIP ARTHROPLASTY ANTERIOR APPROACH;  Surgeon: Kathryne Hitch, MD;  Location: [facility];  Service: Orthopedics;  Laterality: Right;  . TOTAL HIP ARTHROPLASTY Left 07/31/2018   Procedure: LEFT TOTAL HIP ARTHROPLASTY ANTERIOR APPROACH;  Surgeon: Kathryne Hitch, MD;  Location: [facility];  Service: Orthopedics;  Laterality: Left;  Failed Spinal   Social History   Occupational History  . Not on file  Tobacco Use  . Smoking status: Some Days    Current packs/day: 0.00    Types: Cigarettes    Last attempt to quit: 06/14/2017    Years since quitting: 6.1    Passive exposure: Current (Cousin)  . Smokeless tobacco: Current  . Tobacco comments:    Pt states she does vape and does smoke occasionally Tay 02/08/22  Vaping Use  . Vaping status: Never Used  Substance and Sexual Activity  . Alcohol use: Yes    Alcohol/week: 4.0 standard drinks of alcohol    Types: 4 Standard drinks or equivalent per week  . Drug use: Yes    Frequency: 2.0 times per week    Types: Marijuana  . Sexual activity: Yes    Birth control/protection: Pill    Comment: continuous OCP

## 2023-08-04 NOTE — Therapy (Signed)
OUTPATIENT PHYSICAL THERAPY TREATMENT RECERTIFICATION DISCHARGE SUMMARY   Patient Name: Rachel Warren MRN: [id number] DOB:Mar 30, 1976, 48 y.o., female Today's Date: 08/04/2023  END OF SESSION:  PT End of Session - 08/04/23 1307     Visit Number 4    Number of Visits 4    Authorization Type [employer] $52 copay    PT Start Time 1302    PT Stop Time 1340    PT Time Calculation (min) 38 min    Activity Tolerance Patient tolerated treatment well    Behavior During Therapy WFL for tasks assessed/performed                Past Medical History:  Diagnosis Date   Anemia    on meds   Arthritis    generalized   Back pain    lower back    Blood transfusion without reported diagnosis 2020   Complication of anesthesia    age 6 had twilight anesthesia and woke up during surgery    Depression    on meds   Fatty liver 01/22/2016   Noted on MRI abd   Fibroids    Heavy periods    History of gallstones    History of thyroid nodule    Left   Hypertension    on meds   Irregular periods    Obesity    Pancreatic lesion    Pinched nerve    in back/sciatica   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   Ventral hernia 12/26/2014   Midline noted on CT Abd pelvis   Past Surgical History:  Procedure Laterality Date   benign breast cyst removed Left    age 32   CHOLECYSTECTOMY     INSERTION OF MESH N/A 02/27/2015   Procedure: INSERTION OF MESH;  Surgeon: Avel Peace, MD;  Location: [facility];  Service: General;  Laterality: N/A;   IR ANGIOGRAM PELVIS SELECTIVE OR SUPRASELECTIVE  12/22/2018   IR ANGIOGRAM PELVIS SELECTIVE OR SUPRASELECTIVE  12/22/2018   IR ANGIOGRAM SELECTIVE EACH ADDITIONAL VESSEL  12/22/2018   IR ANGIOGRAM SELECTIVE EACH ADDITIONAL VESSEL  12/22/2018   IR EMBO TUMOR ORGAN ISCHEMIA INFARCT INC GUIDE ROADMAPPING  12/22/2018   IR RADIOLOGIST EVAL & MGMT  09/17/2018   IR RADIOLOGIST EVAL & MGMT  01/05/2019   IR RADIOLOGIST EVAL & MGMT  10/06/2019   IR US  GUIDE VASC ACCESS RIGHT  12/22/2018   LAPAROSCOPIC ASSISTED VENTRAL HERNIA REPAIR N/A 02/27/2015   Procedure: LAPAROSCOPIC VENTRAL HERNIA REPAIR WITH MESH;  Surgeon: Avel Peace, MD;  Location: [facility];  Service: General;  Laterality: N/A;   THYROIDECTOMY, PARTIAL     TOTAL HIP ARTHROPLASTY Right 11/28/2017   Procedure: RIGHT TOTAL HIP ARTHROPLASTY ANTERIOR APPROACH;  Surgeon: Kathryne Hitch, MD;  Location: [facility];  Service: Orthopedics;  Laterality: Right;   TOTAL HIP ARTHROPLASTY Left 07/31/2018   Procedure: LEFT TOTAL HIP ARTHROPLASTY ANTERIOR APPROACH;  Surgeon: Kathryne Hitch, MD;  Location: [facility];  Service: Orthopedics;  Laterality: Left;  Failed Spinal   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   Fibroids 01/22/2018   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/28/2017   Unilateral primary osteoarthritis, left hip 11/06/2017   Unilateral primary osteoarthritis, right hip 11/06/2017   Lumbosacral radiculopathy at S1 09/12/2017   Back pain 08/19/2017   Osteoarthritis of  hip 08/19/2017   Abnormal gait 08/19/2017   Obesity 08/19/2017   Ventral hernia s/p laparoscopic repair with mesh 02/27/15 02/27/2015    PCP: Deeann Saint, MD  REFERRING PROVIDER: Madelyn Brunner, DO  REFERRING DIAG: [phone] (ICD-10-CM) - Chronic pain of left knee M17.12 (ICD-10-CM) - Patellofemoral arthritis of left knee M23.007 (ICD-10-CM) - Perimeniscal cyst of left knee  Rationale for Evaluation and Treatment: Rehabilitation  THERAPY DIAG:  Chronic pain of left knee - Plan: PT plan of care cert/re-cert  Muscle weakness (generalized) - Plan: PT plan of care cert/re-cert  Other abnormalities of gait and mobility - Plan: PT plan of care cert/re-cert  ONSET DATE: 2-3 months ago   SUBJECTIVE:                                                                                                                                                                                            SUBJECTIVE STATEMENT: Had one day of pain when she work less supportive shoes but that resolved.  Hips are more uncomfortable today but not sure why.  PERTINENT HISTORY:  Anemia, arthritis, depression, obesity, bil THA  PAIN:  Are you having pain? Yes: NPRS scale: 4 currently, up to 4, at best 1-2/10 Pain location: Lt knee Pain description: electric shocks, stiffness  Aggravating factors: prolonged positioning, standing, walking, squatting Relieving factors: medication  PRECAUTIONS:  None  RED FLAGS: None   WEIGHT BEARING RESTRICTIONS:  No  FALLS:  Has patient fallen in last 6 months? 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; will be moving into new school with stairs in Jan  PLOF:  Independent and Leisure: gardening, swimming, movies, was going to the gym but stopped when she was having hip pain; still has gym membership  PATIENT GOALS:  Improve pain   OBJECTIVE:   DIAGNOSTIC FINDINGS:  MRI scheduled 06/08/23  PATIENT SURVEYS:  06/05/23 FOTO 36 (predicted 56) 08/04/23: FOTO 62  COGNITIVE STATUS: Within functional limits for tasks assessed   SENSATION: WFL  POSTURE:  No Significant postural limitations  GAIT: 06/05/23 Comments: amb mod I with SPC; antalgic gait noted   EDEMA: 06/05/23: mild swelling noted in Lt knee   LOWER EXTREMITY ROM:     ROM Right eval Left eval  Knee flexion  A: 125  Knee extension A: 8 A: -3   (Blank rows = not tested)   LOWER EXTREMITY MMT:    MMT Right eval Left eval Left 08/04/23  Hip flexion  Hip extension  3/5 5/5  Hip abduction  3/5 5/5  Knee flexion 5/5 5/5   Knee extension 5/5 4/5  with pain 5/5  (no pain)   (Blank rows = not  tested)      TREATMENT:                                                                                                                              DATE:  08/04/23 TherEx SciFit bike L5 x 8 min Leg press bil 112# x20; single limb 56# x20 bil Knee extension machine 20# 2x10 Hamstring curls 35# 2x10 MMT - see above for details  07/17/23 TherEx NuStep L5 x 6 min Leg press bil 100# x 10; single limb 50# x 10 bil Knee extension machine 10# 2x10 Hamstring curls 35# 2x10 Discussed gym program and pool exercises - handouts provided  06/19/23 TherEx NuStep L5 x 6 min Seated Lt SLR x 10 reps Bridges x10 reps Sidelying hip circles x 10 reps each direction; LLE Sidelying LLE hip extension x 10 reps  Manual IASTM with percussive device x 3 min to Lt quad   06/05/23 See HEP - demonstrated with trial reps performed PRN for comprehension     PATIENT EDUCATION:  Education details: HEP Person educated: Patient Education method: Programmer, multimedia, Demonstration, and Handouts Education comprehension: verbalized understanding, returned demonstration, and needs further education  HOME EXERCISE PROGRAM: Access Code: [email or web address] URL: [email or web address] Date: 07/17/2023 Prepared by: Moshe Cipro  Exercises - Seated Straight Leg Raise  - 2 x daily - 7 x weekly - 2 sets - 10 reps - Supine Bridge  - 2 x daily - 7 x weekly - 2 sets - 10 reps - 5 sec hold - Sidelying Hip Circles  - 2 x daily - 7 x weekly - 2 sets - 10 circles each way - Standing Hip Abduction Adduction at Pool Wall  - 1 x daily - 7 x weekly - 3 sets - 10 reps - Standing Hip Flexion Extension at [employer]  - 1 x daily - 7 x weekly - 3 sets - 10 reps - Standing March at [facility]  - 1 x daily - 7 x weekly - 3 sets - 10 reps - Side Stepping  - 1 x daily - 7 x weekly - 3 sets - 10 reps - Squat  - 1 x daily - 7 x weekly - 3 sets - 10 reps - Standing Hip Circles at [employer]  - 1 x daily - 7 x weekly - 3 sets - 10  reps - Heel Toe Raises at Pool Wall  - 1 x daily - 7 x weekly - 3 sets - 10 reps - Alternating Forward Lunge  - 1 x daily - 7 x weekly - 3 sets - 10 reps - Single Leg Press  - 1 x daily - 7 x weekly - 3 sets - 10 reps - Knee Extension with Weight Machine  -  1 x daily - 7 x weekly - 3 sets - 10 reps - Hamstring Curl with Weight Machine  - 1 x daily - 7 x weekly - 3 sets - 10 reps - Hip Adduction Machine  - 1 x daily - 7 x weekly - 3 sets - 10 reps - Hip Abduction Machine  - 1 x daily - 7 x weekly - 3 sets - 10 reps   ASSESSMENT:  CLINICAL IMPRESSION: Pt has met all goals and is ready for d/c from PT today,  Encouraged continued exercise either at home or community fitness.     OBJECTIVE IMPAIRMENTS: Abnormal gait, difficulty walking, decreased ROM, decreased strength, increased edema, and pain.   ACTIVITY LIMITATIONS: carrying, bending, sitting, standing, squatting, stairs, transfers, and locomotion level  PARTICIPATION LIMITATIONS: meal prep, cleaning, laundry, shopping, community activity, and occupation  PERSONAL FACTORS: 3+ comorbidities: Anemia, arthritis, depression, obesity, bil THA  are also affecting patient's functional outcome.   REHAB POTENTIAL: Good  CLINICAL DECISION MAKING: Evolving/moderate complexity  EVALUATION COMPLEXITY: Moderate   GOALS: Goals reviewed with patient? Yes  SHORT TERM GOALS: Target date: 07/03/2023  Independent with initial HEP Goal status: MET 07/17/23    LONG TERM GOALS: Target date: 07/31/2023  Independent with final HEP Goal status: MET 08/04/23  2.  FOTO score improved to 56 Goal status:  MET 08/04/23  3.  LLE strength improved to 4+/5 for improved function and mobility Goal status: MET 08/04/23  4.  Report pain < 3/10 with standing and walking for improved function Goal status: MET 08/04/23  PLAN:  PT FREQUENCY: every other week  PT DURATION: 8 weeks  PLANNED INTERVENTIONS: 97164- PT Re-evaluation, 97110-Therapeutic  exercises, 97530- Therapeutic activity, 97112- Neuromuscular re-education, 97535- Self Care, 16109- Manual therapy, [id number]- Gait training, [id number]- Aquatic Therapy, 97014- Electrical stimulation (unattended), 97016- Vasopneumatic device, [id number]- Ultrasound, 09811- Ionotophoresis 4mg /ml Dexamethasone, Patient/Family education, Balance training, Stair training, Taping, Dry Needling, Cryotherapy, and Moist heat.  PLAN FOR NEXT SESSION: d/c PT today   NEXT MD VISIT: 08/04/23   Clarita Crane, PT, DPT 08/04/23 1:42 PM    PHYSICAL THERAPY DISCHARGE SUMMARY  Visits from Start of Care: 4  Current functional level related to goals / functional outcomes: See above   Remaining deficits: See above   Education / Equipment: HEP   Patient agrees to discharge. Patient goals were met. Patient is being discharged due to meeting the stated rehab goals.  Clarita Crane, PT, DPT 08/04/23 1:42 PM  [facility] [address] [place], [place], [address] Phone: [phone]   Fax:  [phone]

## 2023-08-04 NOTE — Progress Notes (Unsigned)
Patient states L Knee feeling better she is concerned with swelling that is reoccurring and a knot that is around injection site

## 2023-08-23 ENCOUNTER — Other Ambulatory Visit: Payer: Self-pay | Admitting: Family Medicine

## 2023-08-23 DIAGNOSIS — D509 Iron deficiency anemia, unspecified: Secondary | ICD-10-CM

## 2023-09-08 ENCOUNTER — Other Ambulatory Visit: Payer: Self-pay | Admitting: Sports Medicine

## 2023-09-08 DIAGNOSIS — G8929 Other chronic pain: Secondary | ICD-10-CM

## 2023-09-08 DIAGNOSIS — M1712 Unilateral primary osteoarthritis, left knee: Secondary | ICD-10-CM

## 2023-09-29 ENCOUNTER — Encounter: Payer: Self-pay | Admitting: Sports Medicine

## 2023-09-29 ENCOUNTER — Ambulatory Visit: Payer: 59 | Admitting: Sports Medicine

## 2023-09-29 DIAGNOSIS — M25562 Pain in left knee: Secondary | ICD-10-CM

## 2023-09-29 DIAGNOSIS — S83282D Other tear of lateral meniscus, current injury, left knee, subsequent encounter: Secondary | ICD-10-CM | POA: Diagnosis not present

## 2023-09-29 DIAGNOSIS — G8929 Other chronic pain: Secondary | ICD-10-CM

## 2023-09-29 DIAGNOSIS — M1712 Unilateral primary osteoarthritis, left knee: Secondary | ICD-10-CM | POA: Diagnosis not present

## 2023-09-29 NOTE — Progress Notes (Signed)
 Patient says that she is overall feeling better. She has more good days than bad and says that the pain she does have is improved compared to what it was. She says that she does get her usual swelling in her legs but that does go down when she elevates her legs at the end of the day. She is still taking Meloxicam once a day and is concerned whether she should be taking this long term.

## 2023-09-29 NOTE — Progress Notes (Signed)
 Rachel Warren - 48 y.o. female MRN [id number]  Date of birth: February 04, 1976  Office Visit Note: Visit Date: 09/29/2023 PCP: Deeann Saint, MD Referred by: Deeann Saint, MD  Subjective: Chief Complaint  Patient presents with   Left Knee - Follow-up   HPI: Rachel Warren is a pleasant 48 y.o. female who presents today for follow-up of chronic knee pain with known OA.  We did proceed with ultrasound-guided parameniscal cyst aspiration and subsequent injection on 07/07/2023, which she had great relief from. In general, she still is doing quite well.  She does use a copper fit compression knee sleeve which helps with the swelling and pain.  She is taking meloxicam 15 mg once daily and would like to talk about long-term use and side effects with this.  Pertinent ROS were reviewed with the patient and found to be negative unless otherwise specified above in HPI.   Assessment & Plan: Visit Diagnoses:  1. Chronic pain of left knee   2. Tear of lateral meniscus of left knee, current, unspecified tear type, subsequent encounter   3. Unilateral primary osteoarthritis, left knee    Plan: Impression is chronic left knee pain with high-grade cartilage loss and associated lateral meniscal tear that has done well with conservative treatment and prior cyst aspiration and the injection about 4 months ago.  She will continue with her copper fit knee sleeve, as well as ice and elevation as needed.  We did discuss tapering off of her meloxicam 15 mg -she will start this week using it every other day for 2 weeks and then will discontinue.  She will let me know following this how her pain is doing and if manageable.  We did discuss trying an intra-articular injection if needed for pain control, but will hold for now given her overall well feeling. F/u as needed.   Follow-up: Return if symptoms worsen or fail to improve.   Meds & Orders: No orders of the defined types were placed in this  encounter.  No orders of the defined types were placed in this encounter.    Procedures: No procedures performed      Clinical History: No specialty comments available.  She reports that she has been smoking cigarettes. She has been exposed to tobacco smoke. She uses smokeless tobacco.  Recent Labs    10/28/22 0850  HGBA1C 5.3    Objective:    Physical Exam  Gen: Well-appearing, in no acute distress; non-toxic CV: Well-perfused. Warm.  Resp: Breathing unlabored on room air; no wheezing. Psych: Fluid speech in conversation; appropriate affect; normal thought process  Ortho Exam - Left knee: There is mild lateral joint line TTP.  There is no significant soft tissue swelling or effusion within the knee joint.  There is mild crepitus over the patellofemoral region with knee flexion and extension.  No varus or valgus instability.  Imaging:  Narrative & Impression  CLINICAL DATA:  Knee pain, chronic. Swelling with tenderness, increasing with walking and standing. No known injury.   EXAM: MRI OF THE LEFT KNEE WITHOUT CONTRAST   TECHNIQUE: Multiplanar, multisequence MR imaging of the knee was performed. No intravenous contrast was administered.   COMPARISON:  Radiographs 05/20/2023   FINDINGS: MENISCI   Medial meniscus:  Intact with normal morphology.   Lateral meniscus: Degenerative tearing of the anterior horn and body with a nearly horizontal component extending into the meniscal body, best seen on the coronal images. Peripheral to the meniscal body, there is  a complex cystic lesion at the joint line, between the iliotibial band and fibular collateral ligament which measures approximately 3.1 x 1.4 x 2.3 cm, suspicious for a parameniscal cyst with internal debris. No centrally displaced meniscal fragments.   LIGAMENTS   Cruciates: The anterior and posterior cruciate ligaments are intact.   Collaterals: The medial and lateral collateral ligament complexes are  intact.   CARTILAGE   Patellofemoral: Moderate patellofemoral degenerative changes for age with diffuse chondral thinning, surface irregularity and subchondral cyst formation in the lateral patellar facet and central trochlea.   Medial: Moderate chondral thinning and surface irregular with near full-thickness thinning over the central aspect of the medial femoral condyle.   Lateral: Moderate chondral thinning and surface irregularity without full-thickness defect.   MISCELLANEOUS   Joint:  Small joint effusion.   Popliteal Fossa: The popliteus muscle and tendon are intact. No significant Baker's cyst.   Extensor Mechanism: The visualized quadriceps and patellar tendons are intact.   Bones:  No acute or significant extra-articular osseous findings.   Other: No other significant periarticular soft tissue findings.   IMPRESSION: 1. Degenerative tearing of the anterior horn and body of the lateral meniscus with suspected lateral parameniscal cyst formation. 2. The medial meniscus, cruciate and collateral ligaments are intact. 3. 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 No acute osseous findings.     Electronically Signed   By: Carey Bullocks M.D.   On: 06/23/2023 10:23   05/20/23: Left knee XR 4 views of the left knee including standing AP, Rosenberg, lateral and  sunrise view were ordered and reviewed by myself.  X-rays show only mild  tibiofemoral joint space narrowing more so on the lateral joint line.   There is at least moderate patellofemoral arthralgia with narrowing.   Small spur off the lateral femoral condyle as well as the superior  patella.   Past Medical/Family/Surgical/Social History: Medications & Allergies reviewed per EMR, new medications updated. 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   Fibroids 01/22/2018    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/28/2017   Unilateral primary osteoarthritis, left hip 11/06/2017   Unilateral primary osteoarthritis, right hip 11/06/2017   Lumbosacral radiculopathy at S1 09/12/2017   Back pain 08/19/2017   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   Abnormal gait 08/19/2017   Obesity 08/19/2017   Ventral hernia s/p laparoscopic repair with mesh 02/27/15 02/27/2015   Past Medical History:  Diagnosis Date   Anemia    on meds   Arthritis    generalized   Back pain    lower back    Blood transfusion without reported diagnosis 2020   Complication of anesthesia    age 49 had twilight anesthesia and woke up during surgery    Depression    on meds   Fatty liver 01/22/2016   Noted on MRI abd   Fibroids    Heavy periods    History of gallstones    History of thyroid nodule    Left   Hypertension    on meds   Irregular periods    Obesity    Pancreatic lesion    Pinched nerve    in back/sciatica   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   Ventral hernia 12/26/2014   Midline noted on CT Abd pelvis   Family History  Problem Relation Age of  Onset   Hypertension Father    Arthritis Father    Clotting disorder Father    Prostate cancer Paternal Grandfather    Colon polyps Neg Hx    Colon cancer Neg Hx    Esophageal cancer Neg Hx    Rectal cancer Neg Hx    Stomach cancer Neg Hx    Past Surgical History:  Procedure Laterality Date   benign breast cyst removed Left    age 69   CHOLECYSTECTOMY     INSERTION OF MESH N/A 02/27/2015   Procedure: INSERTION OF MESH;  Surgeon: Avel Peace, MD;  Location: [facility];  Service: General;  Laterality: N/A;   IR ANGIOGRAM PELVIS SELECTIVE OR SUPRASELECTIVE  12/22/2018   IR ANGIOGRAM PELVIS SELECTIVE OR SUPRASELECTIVE  12/22/2018   IR ANGIOGRAM SELECTIVE EACH ADDITIONAL VESSEL  12/22/2018   IR ANGIOGRAM SELECTIVE EACH ADDITIONAL VESSEL  12/22/2018   IR EMBO TUMOR ORGAN ISCHEMIA INFARCT INC GUIDE  ROADMAPPING  12/22/2018   IR RADIOLOGIST EVAL & MGMT  09/17/2018   IR RADIOLOGIST EVAL & MGMT  01/05/2019   IR RADIOLOGIST EVAL & MGMT  10/06/2019   IR US GUIDE VASC ACCESS RIGHT  12/22/2018   LAPAROSCOPIC ASSISTED VENTRAL HERNIA REPAIR N/A 02/27/2015   Procedure: LAPAROSCOPIC VENTRAL HERNIA REPAIR WITH MESH;  Surgeon: Avel Peace, MD;  Location: [facility];  Service: General;  Laterality: N/A;   THYROIDECTOMY, PARTIAL     TOTAL HIP ARTHROPLASTY Right 11/28/2017   Procedure: RIGHT TOTAL HIP ARTHROPLASTY ANTERIOR APPROACH;  Surgeon: Kathryne Hitch, MD;  Location: [facility];  Service: Orthopedics;  Laterality: Right;   TOTAL HIP ARTHROPLASTY Left 07/31/2018   Procedure: LEFT TOTAL HIP ARTHROPLASTY ANTERIOR APPROACH;  Surgeon: Kathryne Hitch, MD;  Location: [facility];  Service: Orthopedics;  Laterality: Left;  Failed Spinal   Social History   Occupational History   Not on file  Tobacco Use   Smoking status: Some Days    Current packs/day: 0.00    Types: Cigarettes    Last attempt to quit: 06/14/2017    Years since quitting: 6.2    Passive exposure: Current (Cousin)   Smokeless tobacco: Current   Tobacco comments:    Pt states she does vape and does smoke occasionally Tay 02/08/22  Vaping Use   Vaping status: Never Used  Substance and Sexual Activity   Alcohol use: Yes    Alcohol/week: 4.0 standard drinks of alcohol    Types: 4 Standard drinks or equivalent per week   Drug use: Yes    Frequency: 2.0 times per week    Types: Marijuana   Sexual activity: Yes    Birth control/protection: Pill    Comment: continuous OCP

## 2023-10-28 ENCOUNTER — Other Ambulatory Visit: Payer: Self-pay

## 2023-10-28 ENCOUNTER — Ambulatory Visit: Payer: 59 | Admitting: Internal Medicine

## 2023-10-28 ENCOUNTER — Encounter: Payer: Self-pay | Admitting: Internal Medicine

## 2023-10-28 VITALS — BP 114/76 | HR 80 | Temp 98.6°F | Wt 277.1 lb

## 2023-10-28 DIAGNOSIS — J3089 Other allergic rhinitis: Secondary | ICD-10-CM | POA: Diagnosis not present

## 2023-10-28 DIAGNOSIS — K9049 Malabsorption due to intolerance, not elsewhere classified: Secondary | ICD-10-CM | POA: Diagnosis not present

## 2023-10-28 MED ORDER — AZELASTINE HCL 0.1 % NA SOLN: 2.0000 | Freq: Two times a day (BID) | NASAL | 11 refills | Status: AC | PRN

## 2023-10-28 MED ORDER — CETIRIZINE HCL 10 MG PO TABS: 10.0000 mg | ORAL_TABLET | Freq: Every day | ORAL | 11 refills | Status: AC | PRN

## 2023-10-28 NOTE — Patient Instructions (Addendum)
 Allergic Rhinitis:  - Positive skin test 07/2023: dust mites  - Use nasal saline rinses before nose sprays such as with Neilmed Sinus Rinse.  Use distilled water.   - Use Azelastine 2 sprays each nostril twice daily as needed for runny nose, drainage, sneezing, congestion. Aim upward and outward. - Use Zyrtec 10mg  daily as needed for runny nose, sneezing, itchy watery eyes.   History of Food Allergies:  - continue eating shellfish and nuts as you wish.

## 2023-10-28 NOTE — Progress Notes (Signed)
   FOLLOW UP Date of Service/Encounter:  10/28/23   Subjective:  Rachel Warren (DOB: 1975/11/24) is a 48 y.o. female who [facility] for follow up for food reactions and allergic rhinitis.   History obtained from: chart review and patient. Last visit was with me on 07/22/2023 and at the time was positive to DM and negative to all nuts/shellfish.  Bloodwork was also negative for nuts/shellfish.  Discussed reintroduction as tolerated.   Has eaten shellfish for birthday dinner and did not have any issues.   Allergies are doing fine, not much congestion, drainage, sneezing.  Azelastine and Zyrtec are helping; she uses them PRN.   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 Physical Exam: GEN: alert, well developed HEENT: clear conjunctiva, nose with mild inferior turbinate hypertrophy, pink nasal mucosa, slight clear rhinorrhea, no cobblestoning HEART: regular rate and rhythm, no murmur LUNGS: clear to auscultation bilaterally, no coughing, unlabored respiration SKIN: no  rashes or lesions  Assessment:   1. Perennial allergic rhinitis   2. Food intolerance     Plan/Recommendations:   Allergic Rhinitis:  - Well controlled.  - Positive skin test 07/2023: dust mites  - Use nasal saline rinses before nose sprays such as with Neilmed Sinus Rinse.  Use distilled water.   - Use Azelastine 2 sprays each nostril twice daily as needed for runny nose, drainage, sneezing, congestion. Aim upward and outward. - Use Zyrtec 10mg  daily as needed for runny nose, sneezing, itchy watery eyes.   History of Food Reactions:  - continue eating shellfish and nuts as you wish. - SPT 07/2023: negative to nuts and shellfish.   - sIgE 07/2023: negative to peanut, treenuts, shellfish.  - Initial rxn: crabs with throat feeling funny and tongue itching but does fine with shrimp; snickers/walnuts/cashews/pecans similar with mouth/tongue itching and funny throat feeling.       Return in about 1 year (around 10/27/2024).  Kristen Petri, MD [facility]

## 2023-12-01 ENCOUNTER — Other Ambulatory Visit: Payer: Self-pay | Admitting: Family Medicine

## 2023-12-01 DIAGNOSIS — D509 Iron deficiency anemia, unspecified: Secondary | ICD-10-CM

## 2023-12-02 ENCOUNTER — Other Ambulatory Visit: Payer: Self-pay | Admitting: Sports Medicine

## 2023-12-02 DIAGNOSIS — M1712 Unilateral primary osteoarthritis, left knee: Secondary | ICD-10-CM

## 2023-12-02 DIAGNOSIS — G8929 Other chronic pain: Secondary | ICD-10-CM

## 2023-12-04 ENCOUNTER — Other Ambulatory Visit: Payer: Self-pay | Admitting: Family Medicine

## 2023-12-04 DIAGNOSIS — I10 Essential (primary) hypertension: Secondary | ICD-10-CM

## 2023-12-10 ENCOUNTER — Encounter: Payer: Self-pay | Admitting: Family Medicine

## 2023-12-10 ENCOUNTER — Ambulatory Visit (INDEPENDENT_AMBULATORY_CARE_PROVIDER_SITE_OTHER): Admitting: Family Medicine

## 2023-12-10 VITALS — BP 134/72 | HR 76 | Temp 98.4°F | Ht 68.19 in | Wt 283.0 lb

## 2023-12-10 DIAGNOSIS — R4189 Other symptoms and signs involving cognitive functions and awareness: Secondary | ICD-10-CM | POA: Diagnosis not present

## 2023-12-10 DIAGNOSIS — Z0000 Encounter for general adult medical examination without abnormal findings: Secondary | ICD-10-CM | POA: Diagnosis not present

## 2023-12-10 DIAGNOSIS — I10 Essential (primary) hypertension: Secondary | ICD-10-CM | POA: Diagnosis not present

## 2023-12-10 DIAGNOSIS — E559 Vitamin D deficiency, unspecified: Secondary | ICD-10-CM | POA: Diagnosis not present

## 2023-12-10 DIAGNOSIS — F4323 Adjustment disorder with mixed anxiety and depressed mood: Secondary | ICD-10-CM

## 2023-12-10 DIAGNOSIS — N951 Menopausal and female climacteric states: Secondary | ICD-10-CM | POA: Diagnosis not present

## 2023-12-10 DIAGNOSIS — E876 Hypokalemia: Secondary | ICD-10-CM

## 2023-12-10 MED ORDER — AMLODIPINE BESYLATE 10 MG PO TABS: 10.0000 mg | ORAL_TABLET | Freq: Every day | ORAL | 3 refills | Status: AC

## 2023-12-10 NOTE — Progress Notes (Signed)
 Established Patient Office Visit   Subjective  Patient ID: Rachel Warren, female    DOB: 10-06-1975  Age: 48 y.o. MRN: [id number]  Chief Complaint  Patient presents with   Annual Exam    Pt is a 48 yo female seen for CPE and ongoing concerns.  Patient endorses always having "a bad memory" since a teenager.  Notes brain fog worse.  Using sticky notes to remember to do things.  Starting to affect her day-to-day life.  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  States having difficulty completing task despite seeing email notifications about them.  Also forgetting to pay bills.  Patient endorses an episode last week where she felt overwhelmed at work due to someone asking questions, her phone ringing, needing to complete several tasks all at once.  Patient states she began crying.  Several of her coworkers calmed her down and gave her some water .  Patient states she would typically be able to multitask.  Patient notes the death of her cousin around [place] and another cousin who was recently diagnosed with cancer.  Patient mentions seeing OB/GYN, on OCPs.  Still having some hot flashes.  Having a few days of spotting every few months.  Pt taking cymbalta  daily instead of BID due to med making her feel lightheaded.  Also taking gabapentin  which helps more.    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   Fibroids 01/22/2018   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/28/2017   Unilateral primary osteoarthritis, left hip 11/06/2017   Unilateral primary osteoarthritis, right hip 11/06/2017   Lumbosacral radiculopathy at S1 09/12/2017   Back pain 08/19/2017   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   Abnormal gait 08/19/2017   Obesity 08/19/2017   Ventral hernia s/p laparoscopic repair with mesh 02/27/15 02/27/2015   Past Medical  History:  Diagnosis Date   Anemia    on meds   Arthritis    generalized   Back pain    lower back    Blood transfusion without reported diagnosis 2020   Complication of anesthesia    age 31 had twilight anesthesia and woke up during surgery    Depression    on meds   Fatty liver 01/22/2016   Noted on MRI abd   Fibroids    Heavy periods    History of gallstones    History of thyroid  nodule    Left   Hypertension    on meds   Irregular periods    Obesity    Pancreatic lesion    Pinched nerve    in back/sciatica   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   Ventral hernia 12/26/2014   Midline noted on CT Abd pelvis   Past Surgical History:  Procedure Laterality Date   benign breast cyst removed Left    age 40   CHOLECYSTECTOMY     INSERTION OF MESH N/A 02/27/2015   Procedure: INSERTION OF MESH;  Surgeon: Adalberto Hollow, MD;  Location: [facility];  Service: General;  Laterality: N/A;   IR ANGIOGRAM PELVIS SELECTIVE OR SUPRASELECTIVE  12/22/2018   IR ANGIOGRAM PELVIS SELECTIVE OR SUPRASELECTIVE  12/22/2018   IR ANGIOGRAM SELECTIVE EACH ADDITIONAL VESSEL  12/22/2018   IR ANGIOGRAM SELECTIVE EACH ADDITIONAL VESSEL  12/22/2018   IR EMBO TUMOR  ORGAN ISCHEMIA INFARCT INC GUIDE ROADMAPPING  12/22/2018   IR RADIOLOGIST EVAL & MGMT  09/17/2018   IR RADIOLOGIST EVAL & MGMT  01/05/2019   IR RADIOLOGIST EVAL & MGMT  10/06/2019   IR US  GUIDE VASC ACCESS RIGHT  12/22/2018   LAPAROSCOPIC ASSISTED VENTRAL HERNIA REPAIR N/A 02/27/2015   Procedure: LAPAROSCOPIC VENTRAL HERNIA REPAIR WITH MESH;  Surgeon: Adalberto Hollow, MD;  Location: [facility];  Service: General;  Laterality: N/A;   THYROIDECTOMY, PARTIAL     TOTAL HIP ARTHROPLASTY Right 11/28/2017   Procedure: RIGHT TOTAL HIP ARTHROPLASTY ANTERIOR APPROACH;  Surgeon: Arnie Lao, MD;  Location: [facility];  Service: Orthopedics;  Laterality: Right;   TOTAL HIP ARTHROPLASTY Left 07/31/2018   Procedure: LEFT TOTAL HIP ARTHROPLASTY ANTERIOR  APPROACH;  Surgeon: Arnie Lao, MD;  Location: [facility];  Service: Orthopedics;  Laterality: Left;  Failed Spinal   Social History   Tobacco Use   Smoking status: Some Days    Current packs/day: 0.00    Types: Cigarettes    Last attempt to quit: 06/14/2017    Years since quitting: 6.4    Passive exposure: Current (Cousin)   Smokeless tobacco: Current   Tobacco comments:    Pt states she does vape and does smoke occasionally Tay 02/08/22  Vaping Use   Vaping status: Never Used  Substance Use Topics   Alcohol use: Yes    Alcohol/week: 4.0 standard drinks of alcohol    Types: 4 Standard drinks or equivalent per week   Drug use: Yes    Frequency: 2.0 times per week    Types: Marijuana   Family History  Problem Relation Age of Onset   Hypertension Father    Arthritis Father    Clotting disorder Father    Prostate cancer Paternal Grandfather    Colon polyps Neg Hx    Colon cancer Neg Hx    Esophageal cancer Neg Hx    Rectal cancer Neg Hx    Stomach cancer Neg Hx    No Known Allergies  ROS Negative unless stated above    Objective:      BP 134/72 (BP Location: Left Arm, Patient Position: Sitting, Cuff Size: Large)   Pulse 76   Temp 98.4 F (36.9 C) (Oral)   Ht 5' 8.19" (1.732 m)   Wt 283 lb (128.4 kg)   LMP  (LMP Unknown)   SpO2 98%   BMI 42.79 kg/m  BP Readings from Last 3 Encounters:  12/10/23 134/72  10/28/23 114/76  07/15/23 120/76   Wt Readings from Last 3 Encounters:  12/10/23 283 lb (128.4 kg)  10/28/23 277 lb 1.6 oz (125.7 kg)  07/15/23 275 lb 11.2 oz (125.1 kg)      Physical Exam Constitutional:      Appearance: Normal appearance.  HENT:     Head: Normocephalic and atraumatic.     Right Ear: Tympanic membrane, ear canal and external ear normal.     Left Ear: Tympanic membrane, ear canal and external ear normal.     Nose: Nose normal.     Mouth/Throat:     Mouth: Mucous membranes are moist.     Pharynx: No oropharyngeal exudate  or posterior oropharyngeal erythema.  Eyes:     General: No scleral icterus.    Extraocular Movements: Extraocular movements intact.     Conjunctiva/sclera: Conjunctivae normal.     Pupils: Pupils are equal, round, and reactive to light.  Neck:     Thyroid : No thyromegaly.  Cardiovascular:     Rate and Rhythm: Normal rate and regular rhythm.     Pulses: Normal pulses.     Heart sounds: Normal heart sounds. No murmur heard.    No friction rub.  Pulmonary:     Effort: Pulmonary effort is normal.     Breath sounds: Normal breath sounds. No wheezing, rhonchi or rales.  Abdominal:     General: Bowel sounds are normal.     Palpations: Abdomen is soft.     Tenderness: There is no abdominal tenderness.  Musculoskeletal:        General: No deformity. Normal range of motion.  Lymphadenopathy:     Cervical: No cervical adenopathy.  Skin:    General: Skin is warm and dry.     Findings: No lesion.  Neurological:     General: No focal deficit present.     Mental Status: She is alert and oriented to person, place, and time.  Psychiatric:        Mood and Affect: Mood normal.        Thought Content: Thought content normal.        12/10/2023    4:36 PM 09/26/2021    9:47 AM 09/20/2020    9:14 AM  Depression screen PHQ 2/9  Decreased Interest 1 1 0  Down, Depressed, Hopeless 1 0 1  PHQ - 2 Score 2 1 1   Altered sleeping 1 1   Tired, decreased energy 2 1   Change in appetite 1 0   Feeling bad or failure about yourself  1 0   Trouble concentrating 1 0   Moving slowly or fidgety/restless 1 0   Suicidal thoughts 0 0   PHQ-9 Score 9 3   Difficult doing work/chores Somewhat difficult        12/10/2023    4:36 PM  GAD 7 : Generalized Anxiety Score  Nervous, Anxious, on Edge 1  Control/stop worrying 1  Worry too much - different things 3  Trouble relaxing 1  Restless 0  Easily annoyed or irritable 2  Afraid - awful might happen 0  Total GAD 7 Score 8  Anxiety Difficulty Not  difficult at all   No results found for any visits on 12/10/23.    Assessment & Plan:   Well adult exam -     Comprehensive metabolic panel with GFR; Future -     CBC with Differential/Platelet; Future -     T4, free; Future -     Hemoglobin A1c; Future  Vitamin D  deficiency -     VITAMIN D  25 Hydroxy (Vit-D Deficiency, Fractures); Future  Essential hypertension -     T4, free; Future -     TSH; Future -     amLODIPine  Besylate; Take 1 tablet (10 mg total) by mouth daily.  Dispense: 90 tablet; Refill: 3  Brain fog -     Comprehensive metabolic panel with GFR; Future -     CBC with Differential/Platelet; Future -     T4, free; Future -     TSH; Future -     Vitamin B12; Future -     Folate  Adjustment disorder with mixed anxiety and depressed mood -     T4, free; Future -     TSH; Future  Perimenopausal symptoms -     Follicle stimulating hormone -     Luteinizing hormone -     Estrogens, total   Age-appropriate health screenings discussed.  Obtain labs.  Immunizations reviewed.  Patient to schedule mammogram.  Last Pap done 09/26/2021 with OB/GYN.  Colonoscopy done 11/02/2021.  BP well-controlled.  Continue current medications Norvasc  10 mg daily, Toprol -XL 50 mg daily.  Discussed brain fog symptoms, recent crying spell while could be separate issues may possibly  be due to perimenopause.  Also consider anxiety, depression/panic attack.  PHQ-9 score 9, GAD-7 score 8 this visit.  Pt encouraged to look into counseling.  Considering EAP.  Discussed medication options, though currently on cymbalta .  Will wait at this time.    Obtain labs to evaluate perimenopause/menopause.  Advised to discuss further with Ob/Gyn.  Will have pt f/u in the next few wks to further address ongoing issues/review labs.  Return in about 2 weeks (around 12/24/2023).   Viola Greulich, MD

## 2023-12-11 ENCOUNTER — Other Ambulatory Visit: Payer: Self-pay | Admitting: Family Medicine

## 2023-12-11 ENCOUNTER — Telehealth: Payer: Self-pay

## 2023-12-11 DIAGNOSIS — E876 Hypokalemia: Secondary | ICD-10-CM

## 2023-12-11 LAB — TSH: TSH: 2.9 u[IU]/mL (ref 0.35–5.50)

## 2023-12-11 LAB — CBC WITH DIFFERENTIAL/PLATELET
Basophils Absolute: 0.1 10*3/uL (ref 0.0–0.1)
Basophils Relative: 1.3 % (ref 0.0–3.0)
Eosinophils Absolute: 0 10*3/uL (ref 0.0–0.7)
Eosinophils Relative: 0.1 % (ref 0.0–5.0)
HCT: 41.5 % (ref 36.0–46.0)
Hemoglobin: 14.2 g/dL (ref 12.0–15.0)
Lymphocytes Relative: 30 % (ref 12.0–46.0)
Lymphs Abs: 2.1 10*3/uL (ref 0.7–4.0)
MCHC: 34.2 g/dL (ref 30.0–36.0)
MCV: 90.8 fl (ref 78.0–100.0)
Monocytes Absolute: 0.5 10*3/uL (ref 0.1–1.0)
Monocytes Relative: 7.3 % (ref 3.0–12.0)
Neutro Abs: 4.3 10*3/uL (ref 1.4–7.7)
Neutrophils Relative %: 61.3 % (ref 43.0–77.0)
Platelets: 183 10*3/uL (ref 150.0–400.0)
RBC: 4.57 Mil/uL (ref 3.87–5.11)
RDW: 13.6 % (ref 11.5–15.5)
WBC: 7 10*3/uL (ref 4.0–10.5)

## 2023-12-11 LAB — COMPREHENSIVE METABOLIC PANEL WITH GFR
ALT: 13 U/L (ref 0–35)
AST: 18 U/L (ref 0–37)
Albumin: 4 g/dL (ref 3.5–5.2)
Alkaline Phosphatase: 47 U/L (ref 39–117)
BUN: 13 mg/dL (ref 6–23)
CO2: 32 meq/L (ref 19–32)
Calcium: 8.3 mg/dL — ABNORMAL LOW (ref 8.4–10.5)
Chloride: 99 meq/L (ref 96–112)
Creatinine, Ser: 1 mg/dL (ref 0.40–1.20)
GFR: 66.29 mL/min (ref >60.00)
Glucose, Bld: 78 mg/dL (ref 70–99)
Potassium: 2.7 meq/L — CL (ref 3.5–5.1)
Sodium: 138 meq/L (ref 135–145)
Total Bilirubin: 0.7 mg/dL (ref 0.2–1.2)
Total Protein: 6.5 g/dL (ref 6.0–8.3)

## 2023-12-11 LAB — T4, FREE: Free T4: 0.86 ng/dL (ref 0.60–1.60)

## 2023-12-11 LAB — FOLLICLE STIMULATING HORMONE: FSH: 4.7 m[IU]/mL

## 2023-12-11 LAB — LUTEINIZING HORMONE: LH: 5.81 m[IU]/mL

## 2023-12-11 LAB — VITAMIN D 25 HYDROXY (VIT D DEFICIENCY, FRACTURES): VITD: 31 ng/mL (ref 30.00–100.00)

## 2023-12-11 LAB — FOLATE: Folate: 8.2 ng/mL (ref >5.9)

## 2023-12-11 LAB — HEMOGLOBIN A1C: Hgb A1c MFr Bld: 5.2 % (ref 4.6–6.5)

## 2023-12-11 LAB — VITAMIN B12: Vitamin B-12: 156 pg/mL — ABNORMAL LOW (ref 211–911)

## 2023-12-11 MED ORDER — POTASSIUM CHLORIDE CRYS ER 20 MEQ PO TBCR: EXTENDED_RELEASE_TABLET | ORAL | 0 refills | Status: AC

## 2023-12-11 NOTE — Telephone Encounter (Signed)
 Prescription for potassium supplement was sent to patient's pharmacy.

## 2023-12-11 NOTE — Telephone Encounter (Signed)
 Dr. Arliss Lam is aware

## 2023-12-12 ENCOUNTER — Ambulatory Visit: Payer: Self-pay | Admitting: Family Medicine

## 2023-12-14 LAB — ESTROGENS, TOTAL: Estrogen: 350 pg/mL

## 2023-12-23 ENCOUNTER — Other Ambulatory Visit: Payer: Self-pay | Admitting: Family Medicine

## 2023-12-29 ENCOUNTER — Ambulatory Visit (INDEPENDENT_AMBULATORY_CARE_PROVIDER_SITE_OTHER): Admitting: *Deleted

## 2023-12-29 DIAGNOSIS — E538 Deficiency of other specified B group vitamins: Secondary | ICD-10-CM

## 2023-12-29 MED ORDER — CYANOCOBALAMIN 1000 MCG/ML IJ SOLN
1000.0000 ug | Freq: Once | INTRAMUSCULAR | Status: AC
  Administered 2023-12-29: 1000 ug via INTRAMUSCULAR

## 2023-12-29 NOTE — Progress Notes (Signed)
 Per orders  Dr. Arliss Lam, injection of B12 given by Nicolina Barrios. Patient tolerated injection well.

## 2023-12-31 ENCOUNTER — Other Ambulatory Visit: Payer: Self-pay | Admitting: Sports Medicine

## 2023-12-31 DIAGNOSIS — G8929 Other chronic pain: Secondary | ICD-10-CM

## 2023-12-31 DIAGNOSIS — M1712 Unilateral primary osteoarthritis, left knee: Secondary | ICD-10-CM

## 2024-01-05 ENCOUNTER — Other Ambulatory Visit: Payer: Self-pay | Admitting: Family Medicine

## 2024-01-05 DIAGNOSIS — D509 Iron deficiency anemia, unspecified: Secondary | ICD-10-CM

## 2024-01-12 ENCOUNTER — Ambulatory Visit: Admitting: Family Medicine

## 2024-01-25 ENCOUNTER — Other Ambulatory Visit: Payer: Self-pay | Admitting: Sports Medicine

## 2024-01-25 DIAGNOSIS — G8929 Other chronic pain: Secondary | ICD-10-CM

## 2024-01-25 DIAGNOSIS — M1712 Unilateral primary osteoarthritis, left knee: Secondary | ICD-10-CM

## 2024-01-28 ENCOUNTER — Ambulatory Visit (INDEPENDENT_AMBULATORY_CARE_PROVIDER_SITE_OTHER): Admitting: *Deleted

## 2024-01-28 ENCOUNTER — Ambulatory Visit: Admitting: Family Medicine

## 2024-01-28 ENCOUNTER — Ambulatory Visit

## 2024-01-28 DIAGNOSIS — E538 Deficiency of other specified B group vitamins: Secondary | ICD-10-CM

## 2024-01-28 MED ORDER — CYANOCOBALAMIN 1000 MCG/ML IJ SOLN
1000.0000 ug | Freq: Once | INTRAMUSCULAR | Status: AC
  Administered 2024-01-28: 1000 ug via INTRAMUSCULAR

## 2024-01-28 NOTE — Progress Notes (Signed)
Per orders of Dr. Banks, injection of Cyanocobalamin 1000mcg given by Funderburk, Jo A. °Patient tolerated injection well. ° °

## 2024-01-30 ENCOUNTER — Other Ambulatory Visit: Payer: Self-pay | Admitting: Family Medicine

## 2024-01-30 DIAGNOSIS — D509 Iron deficiency anemia, unspecified: Secondary | ICD-10-CM

## 2024-02-05 ENCOUNTER — Encounter: Payer: Self-pay | Admitting: Sports Medicine

## 2024-02-06 NOTE — Telephone Encounter (Signed)
 Hold for [employer]

## 2024-02-21 ENCOUNTER — Other Ambulatory Visit: Payer: Self-pay | Admitting: Sports Medicine

## 2024-02-21 DIAGNOSIS — G8929 Other chronic pain: Secondary | ICD-10-CM

## 2024-02-21 DIAGNOSIS — M1712 Unilateral primary osteoarthritis, left knee: Secondary | ICD-10-CM

## 2024-03-01 ENCOUNTER — Ambulatory Visit

## 2024-03-02 ENCOUNTER — Other Ambulatory Visit: Payer: Self-pay | Admitting: Family Medicine

## 2024-03-02 ENCOUNTER — Ambulatory Visit (INDEPENDENT_AMBULATORY_CARE_PROVIDER_SITE_OTHER)

## 2024-03-02 DIAGNOSIS — E538 Deficiency of other specified B group vitamins: Secondary | ICD-10-CM | POA: Diagnosis not present

## 2024-03-02 DIAGNOSIS — D509 Iron deficiency anemia, unspecified: Secondary | ICD-10-CM

## 2024-03-02 MED ORDER — CYANOCOBALAMIN 1000 MCG/ML IJ SOLN
1000.0000 ug | Freq: Once | INTRAMUSCULAR | Status: AC
  Administered 2024-03-02: 1000 ug via INTRAMUSCULAR

## 2024-03-02 NOTE — Progress Notes (Signed)
 Patient is in office today for a nurse visit for B12 Injection. Patient Injection was given in the  Left deltoid. Patient tolerated injection well.

## 2024-03-22 ENCOUNTER — Encounter: Payer: Self-pay | Admitting: Family Medicine

## 2024-03-22 ENCOUNTER — Ambulatory Visit (INDEPENDENT_AMBULATORY_CARE_PROVIDER_SITE_OTHER): Admitting: Family Medicine

## 2024-03-22 VITALS — BP 142/84 | HR 69 | Temp 97.8°F | Ht 68.19 in | Wt 277.8 lb

## 2024-03-22 DIAGNOSIS — I10 Essential (primary) hypertension: Secondary | ICD-10-CM

## 2024-03-22 DIAGNOSIS — R0683 Snoring: Secondary | ICD-10-CM | POA: Diagnosis not present

## 2024-03-22 DIAGNOSIS — Z23 Encounter for immunization: Secondary | ICD-10-CM | POA: Diagnosis not present

## 2024-03-22 DIAGNOSIS — E538 Deficiency of other specified B group vitamins: Secondary | ICD-10-CM

## 2024-03-22 DIAGNOSIS — G4700 Insomnia, unspecified: Secondary | ICD-10-CM | POA: Diagnosis not present

## 2024-03-22 NOTE — Progress Notes (Signed)
 Established Patient Office Visit   Subjective  Patient ID: Rachel Warren, female    DOB: January 10, 1976  Age: 48 y.o. MRN: [id number]  Chief Complaint  Patient presents with   Medical Management of Chronic Issues    Vit b 12 follow-up, Flu shot and covid   Pt accompanied by her grandson.  Pt is a 48 yo female seen for f/u.  Pt states L knee starting to bother her again.  Was good for several months s/p steroid injection by Ortho.  Taken off mobic .  Did PT.  Had to start using voltaren  gel again.  Has increased stiffness, pain behind patellar tendon/patella, and edema.   Was doing water  aerobics which helped.  Ironically, the warm moist environment at the aquatic center causes increased cramping of hands and legs.  Pt inquires about getting COVID and flu vaccines.  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  Pt having difficulty falling asleep.  States schedule has been off during the summer.  Would get in bed whenever, sometimes as late as 1 am, but would get up around 5 am and take naps if needed during the day.  Now tries to get in bed around 9:30-10 pm, 11 pm at the latest.  Cannot fall asleep in complete darkness.  States has been this way since a child.  Nightlight is not enough light.  Has to keep the tv on.  Pt tried one of her daughter's sleeping pills.  States her body was relaxed, but her mind kept going.  Felt rested in the morning, but did not like that sensation.  Pt notes some increased stress financially as the price of groceries continues to rise.  Pt has been told by her daughter that she snores. Taking bp meds consistently.    Pt asks when next colonoscopy is due.    Pt completed vitamin b12 injections, but has yet to start OTC med.  Did not notice any improvement in energy.    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   Fibroids 01/22/2018   Status  post total replacement of right hip 11/28/2017   Unilateral primary osteoarthritis, left hip 11/06/2017   Unilateral primary osteoarthritis, right hip 11/06/2017   Lumbosacral radiculopathy at S1 09/12/2017   Back pain 08/19/2017   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   Abnormal gait 08/19/2017   Obesity 08/19/2017   Ventral hernia s/p laparoscopic repair with mesh 02/27/15 02/27/2015   Past Medical History:  Diagnosis Date   Anemia    on meds   Anxiety    Arthritis    generalized   Back pain    lower back    Blood transfusion without reported diagnosis 2020   Complication of anesthesia    age 44 had twilight anesthesia and woke up during surgery    Depression    on meds   Fatty liver 01/22/2016   Noted on MRI abd   Fibroids    Heavy periods    History of gallstones    History of thyroid  nodule    Left   Hypertension    on meds   Irregular periods    Obesity    Pancreatic lesion    Pinched nerve    in back/sciatica   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   Ventral hernia 12/26/2014   Midline noted  on CT Abd pelvis   Past Surgical History:  Procedure Laterality Date   benign breast cyst removed Left    age 53   CHOLECYSTECTOMY     HERNIA REPAIR     INSERTION OF MESH N/A 02/27/2015   Procedure: INSERTION OF MESH;  Surgeon: Krystal Russell, MD;  Location: [facility];  Service: General;  Laterality: N/A;   IR ANGIOGRAM PELVIS SELECTIVE OR SUPRASELECTIVE  12/22/2018   IR ANGIOGRAM PELVIS SELECTIVE OR SUPRASELECTIVE  12/22/2018   IR ANGIOGRAM SELECTIVE EACH ADDITIONAL VESSEL  12/22/2018   IR ANGIOGRAM SELECTIVE EACH ADDITIONAL VESSEL  12/22/2018   IR EMBO TUMOR ORGAN ISCHEMIA INFARCT INC GUIDE ROADMAPPING  12/22/2018   IR RADIOLOGIST EVAL & MGMT  09/17/2018   IR RADIOLOGIST EVAL & MGMT  01/05/2019   IR RADIOLOGIST EVAL & MGMT  10/06/2019   IR US  GUIDE VASC ACCESS RIGHT  12/22/2018   JOINT REPLACEMENT     LAPAROSCOPIC ASSISTED VENTRAL HERNIA REPAIR N/A  02/27/2015   Procedure: LAPAROSCOPIC VENTRAL HERNIA REPAIR WITH MESH;  Surgeon: Krystal Russell, MD;  Location: [facility];  Service: General;  Laterality: N/A;   THYROIDECTOMY, PARTIAL     TOTAL HIP ARTHROPLASTY Right 11/28/2017   Procedure: RIGHT TOTAL HIP ARTHROPLASTY ANTERIOR APPROACH;  Surgeon: Vernetta Lonni GRADE, MD;  Location: [facility];  Service: Orthopedics;  Laterality: Right;   TOTAL HIP ARTHROPLASTY Left 07/31/2018   Procedure: LEFT TOTAL HIP ARTHROPLASTY ANTERIOR APPROACH;  Surgeon: Vernetta Lonni GRADE, MD;  Location: [facility];  Service: Orthopedics;  Laterality: Left;  Failed Spinal   Social History   Tobacco Use   Smoking status: Some Days    Current packs/day: 0.00    Types: Cigarettes    Last attempt to quit: 06/14/2017    Years since quitting: 6.7    Passive exposure: Current (Cousin)   Smokeless tobacco: Current   Tobacco comments:    Pt states she does vape and does smoke occasionally Tay 02/08/22  Vaping Use   Vaping status: Never Used  Substance Use Topics   Alcohol use: Yes    Alcohol/week: 4.0 standard drinks of alcohol    Types: 4 Standard drinks or equivalent per week   Drug use: Yes    Frequency: 2.0 times per week    Types: Marijuana   Family History  Problem Relation Age of Onset   Hypertension Father    Arthritis Father    Clotting disorder Father    Prostate cancer Paternal Grandfather    Colon polyps Neg Hx    Colon cancer Neg Hx    Esophageal cancer Neg Hx    Rectal cancer Neg Hx    Stomach cancer Neg Hx    No Known Allergies  ROS Negative unless stated above    Objective:     BP (!) 142/84 (BP Location: Left Arm, Patient Position: Sitting, Cuff Size: Large)   Pulse 69   Temp 97.8 F (36.6 C) (Oral)   Ht 5' 8.19 (1.732 m)   Wt 277 lb 12.8 oz (126 kg)   SpO2 99%   BMI 42.00 kg/m  BP Readings from Last 3 Encounters:  03/22/24 (!) 142/84  12/10/23 134/72  10/28/23 114/76   Wt Readings from Last 3 Encounters:  03/22/24 277  lb 12.8 oz (126 kg)  12/10/23 283 lb (128.4 kg)  10/28/23 277 lb 1.6 oz (125.7 kg)      Physical Exam Constitutional:      General: She is not in acute distress.  Appearance: Normal appearance.  HENT:     Head: Normocephalic and atraumatic.     Nose: Nose normal.     Mouth/Throat:     Mouth: Mucous membranes are moist.  Cardiovascular:     Rate and Rhythm: Normal rate and regular rhythm.     Heart sounds: Normal heart sounds. No murmur heard.    No gallop.  Pulmonary:     Effort: Pulmonary effort is normal. No respiratory distress.     Breath sounds: Normal breath sounds. No wheezing, rhonchi or rales.  Musculoskeletal:     Comments: Mild edema of L knee.  No effusion noted.  TTP of Lateral jt line and infrapatellar area.  No popliteal fossa edema.  FROM of b/l LEs.  Skin:    General: Skin is warm and dry.  Neurological:     Mental Status: She is alert and oriented to person, place, and time.        03/22/2024    4:26 PM 12/10/2023    4:36 PM 09/26/2021    9:47 AM  Depression screen PHQ 2/9  Decreased Interest 0 1 1  Down, Depressed, Hopeless 1 1 0  PHQ - 2 Score 1 2 1   Altered sleeping 1 1 1   Tired, decreased energy 1 2 1   Change in appetite 1 1 0  Feeling bad or failure about yourself  1 1 0  Trouble concentrating 1 1 0  Moving slowly or fidgety/restless 1 1 0  Suicidal thoughts 0 0 0  PHQ-9 Score 7 9 3   Difficult doing work/chores Not difficult at all Somewhat difficult       03/22/2024    4:27 PM 12/10/2023    4:36 PM  GAD 7 : Generalized Anxiety Score  Nervous, Anxious, on Edge 3 1  Control/stop worrying 3 1  Worry too much - different things 3 3  Trouble relaxing 3 1  Restless 1 0  Easily annoyed or irritable 1 2  Afraid - awful might happen 2 0  Total GAD 7 Score 16 8  Anxiety Difficulty Not difficult at all Not difficult at all     No results found for any visits on 03/22/24.    Assessment & Plan:   Essential hypertension -     Home sleep  test  Snoring -     Home sleep test  Need for influenza vaccination -     Flu vaccine trivalent PF, 6mos and older(Flulaval,Afluria,Fluarix,Fluzone)  Insomnia, unspecified type  Vitamin B 12 deficiency   BP elevated this visit.  Recheck.  Continue metoprolol  succinate 50 mg daily, norvasc  10 mg daily, and lifestyle modifications.  Will try to restart water  aerobics.  Monitor bp at home and keep a log to bring to clinic.  Sleep study ordered.  Sleep hygiene.  Discussed trying to shift sleep schedule.  Consider CBT.  For continued symptoms consider hydroxyzine or trazodone.  Influenza vaccine given this visit.  Pt to get COVID vaccine at pharmacy.  Last colonoscopy was 11/02/21 with 7 yr recall.  Due 11/02/2028.  Start OTC vit b12 or a MVI with B12 1000-2000 IUs in it.  Return in about 3 months (around 06/21/2024).   Clotilda JONELLE Single, MD

## 2024-04-29 ENCOUNTER — Other Ambulatory Visit: Payer: Self-pay | Admitting: Sports Medicine

## 2024-04-29 DIAGNOSIS — M1712 Unilateral primary osteoarthritis, left knee: Secondary | ICD-10-CM

## 2024-04-29 DIAGNOSIS — G8929 Other chronic pain: Secondary | ICD-10-CM

## 2024-05-17 ENCOUNTER — Encounter: Payer: Self-pay | Admitting: Radiology

## 2024-05-23 ENCOUNTER — Other Ambulatory Visit: Payer: Self-pay | Admitting: Sports Medicine

## 2024-05-23 DIAGNOSIS — M1712 Unilateral primary osteoarthritis, left knee: Secondary | ICD-10-CM

## 2024-05-23 DIAGNOSIS — G8929 Other chronic pain: Secondary | ICD-10-CM

## 2024-05-31 ENCOUNTER — Ambulatory Visit: Admitting: Sports Medicine

## 2024-05-31 ENCOUNTER — Encounter: Payer: Self-pay | Admitting: Sports Medicine

## 2024-05-31 ENCOUNTER — Other Ambulatory Visit: Payer: Self-pay

## 2024-05-31 DIAGNOSIS — M25562 Pain in left knee: Secondary | ICD-10-CM | POA: Diagnosis not present

## 2024-05-31 DIAGNOSIS — M23007 Cystic meniscus, unspecified meniscus, left knee: Secondary | ICD-10-CM

## 2024-05-31 DIAGNOSIS — S83282D Other tear of lateral meniscus, current injury, left knee, subsequent encounter: Secondary | ICD-10-CM | POA: Diagnosis not present

## 2024-05-31 DIAGNOSIS — M170 Bilateral primary osteoarthritis of knee: Secondary | ICD-10-CM

## 2024-05-31 DIAGNOSIS — G8929 Other chronic pain: Secondary | ICD-10-CM | POA: Diagnosis not present

## 2024-05-31 NOTE — Progress Notes (Signed)
 Rachel Warren - 48 y.o. female MRN [id number]  Date of birth: [birth year]  Office Visit Note: Visit Date: 05/31/2024 PCP: Mercer Clotilda SAUNDERS, MD Referred by: Mercer Clotilda SAUNDERS, MD  Subjective: Chief Complaint  Patient presents with   Left Knee - Follow-up   HPI: Rachel Warren is a pleasant 48 y.o. female who presents today for follow-up of acute on chronic left knee pain, also with some right knee pain.  Zaylin states the left knee was doing well for quite some time.  1 year ago we did perform ultrasound-guided parameniscal cyst aspiration and subsequent injection which gave her essentially complete relief for many months.  She has been taking meloxicam  15 mg daily, was even able to stop this for period of time over the summer.  However, around October her pain started to return, she has noticed swelling and discomfort over the lateral joint line.  She has had 4-5 episodes of feeling like the knee is giving out on her as well.  She has used ice/heat as well as elevation.  Swelling comes and goes, she does wear a neoprene compressive knee sleeve at times.  She has been back on meloxicam  15 mg but her pain and swelling still persists.  She is noticing pain over the right knee as well, however is not having swelling.  Pertinent ROS were reviewed with the patient and found to be negative unless otherwise specified above in HPI.   Assessment & Plan: Visit Diagnoses:  1. Chronic pain of left knee   2. Perimeniscal cyst of left knee   3. Tear of lateral meniscus of left knee, current, unspecified tear type, subsequent encounter   4. Bilateral primary osteoarthritis of knee    Plan: Impression is acute on chronic left knee pain with reoccurrence of parameniscal cyst over the lateral joint line in the setting of MRI confirmed degenerative tearing of the anterior horn and body of the lateral meniscus.  She did receive nearly 100% pain relief for a good 6 to 9 months after previous  parameniscal cyst aspiration and injection, however her pain has returned.  Bedside ultrasound demonstrates reaccumulation of parameniscal cyst with meniscal body extrusion.  The mid parameniscal cyst is now multiloculated.  We discussed all treatment options including being evaluated for knee arthroscopy with meniscectomy and evacuation of parameniscal cyst to hopefully prevent this from happening in the future.  She does have mild to moderate knee arthritic change, but I do not think it is advanced enough to discuss knee replacement.  We also consider trialing a second ultrasound-guided aspiration and injection, but I would like her to have an evaluation first with my partner, Dr. Genelle and for possible arthroscopic intervention.  Her right knee does have mild arthritic change which is slightly flared given her compensating for the left knee.  She will continue meloxicam  15 mg daily in the interim.  If surgical intervention is not to be ascertained, I can certainly see her back and consider an additional aspiration, but would like surgical evaluation first to see if we can treat the origin/root cause.  Follow-up: Return for Appt first with Dr. Genelle.   Meds & Orders: No orders of the defined types were placed in this encounter.   Orders Placed This Encounter  Procedures   US  Extrem Low Left Ltd   Ambulatory referral to Orthopedic Surgery     Procedures: No procedures performed      Clinical History: No specialty comments available.  She reports that  she has been smoking cigarettes. She has been exposed to tobacco smoke. She uses smokeless tobacco.  Recent Labs    12/10/23 1635  HGBA1C 5.2    Objective:    Physical Exam  Gen: Well-appearing, in no acute distress; non-toxic CV: Well-perfused. Warm.  Resp: Breathing unlabored on room air; no wheezing. Psych: Fluid speech in conversation; appropriate affect; normal thought process  Ortho Exam - Left knee: There is tenderness over  the lateral joint line, there is a mild pain with clicking with McMurray's testing over the lateral joint line.  Range of motion 0-130 degrees.  There is a trace effusion over the knee with swelling over the lateral joint line.  - Right knee: No redness swelling or effusion.  There is mild patellofemoral crepitus.  Range of motion 0-135 degrees.  Imaging:  US  Extrem Low Left Ltd Limited musculoskeletal ultrasound of the left knee was performed today.   There is a trace knee joint effusion.  Overlying quadricep tendon shows  proper insertion without evidence of tearing.  With attention to the  lateral joint line, there is incompletely visualized lateral meniscus  which does appear to have degenerative tear with meniscal body extrusion.   Emanating from this extrusion is a hypoechoic cystic structure indicative  of likely parameniscal cyst.  This is multiloculated in nature.      MR Knee Left w/o contrast CLINICAL DATA:  Knee pain, chronic. Swelling with tenderness, increasing with walking and standing. No known injury.   EXAM: MRI OF THE LEFT KNEE WITHOUT CONTRAST   TECHNIQUE: Multiplanar, multisequence MR imaging of the knee was performed. No intravenous contrast was administered.   COMPARISON:  Radiographs 05/20/2023   FINDINGS: MENISCI   Medial meniscus:  Intact with normal morphology.   Lateral meniscus: Degenerative tearing of the anterior horn and body with a nearly horizontal component extending into the meniscal body, best seen on the coronal images. Peripheral to the meniscal body, there is a complex cystic lesion at the joint line, between the iliotibial band and fibular collateral ligament which measures approximately 3.1 x 1.4 x 2.3 cm, suspicious for a parameniscal cyst with internal debris. No centrally displaced meniscal fragments.   LIGAMENTS   Cruciates: The anterior and posterior cruciate ligaments are intact.   Collaterals: The medial and lateral  collateral ligament complexes are intact.   CARTILAGE   Patellofemoral: Moderate patellofemoral degenerative changes for age with diffuse chondral thinning, surface irregularity and subchondral cyst formation in the lateral patellar facet and central trochlea.   Medial: Moderate chondral thinning and surface irregular with near full-thickness thinning over the central aspect of the medial femoral condyle.   Lateral: Moderate chondral thinning and surface irregularity without full-thickness defect.   MISCELLANEOUS   Joint:  Small joint effusion.   Popliteal Fossa: The popliteus muscle and tendon are intact. No significant Baker's cyst.   Extensor Mechanism: The visualized quadriceps and patellar tendons are intact.   Bones:  No acute or significant extra-articular osseous findings.   Other: No other significant periarticular soft tissue findings.   IMPRESSION: 1. Degenerative tearing of the anterior horn and body of the lateral meniscus with suspected lateral parameniscal cyst formation. 2. The medial meniscus, cruciate and collateral ligaments are intact. 3. 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 No acute osseous findings.   Electronically Signed   By: Elsie Perone M.D.   On: 06/23/2023 10:23  Past Medical/Family/Surgical/Social History: Medications & Allergies reviewed per EMR, new medications updated. Patient Active Problem List  Diagnosis Date Noted   Protrusion of lumbar intervertebral disc 05/31/2020   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   Fibroids 01/22/2018   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/28/2017   Unilateral primary osteoarthritis, left hip 11/06/2017   Unilateral primary osteoarthritis, right hip 11/06/2017   Lumbosacral radiculopathy at S1 09/12/2017   Back pain 08/19/2017   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   Abnormal gait 08/19/2017   Obesity 08/19/2017   Ventral hernia s/p laparoscopic repair  with mesh 02/27/15 02/27/2015   Past Medical History:  Diagnosis Date   Anemia    on meds   Anxiety    Arthritis    generalized   Back pain    lower back    Blood transfusion without reported diagnosis 2020   Complication of anesthesia    age 68 had twilight anesthesia and woke up during surgery    Depression    on meds   Fatty liver 01/22/2016   Noted on MRI abd   Fibroids    Heavy periods    History of gallstones    History of thyroid  nodule    Left   Hypertension    on meds   Irregular periods    Obesity    Pancreatic lesion    Pinched nerve    in back/sciatica   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   Ventral hernia 12/26/2014   Midline noted on CT Abd pelvis   Family History  Problem Relation Age of Onset   Hypertension Father    Arthritis Father    Clotting disorder Father    Prostate cancer Paternal Grandfather    Colon polyps Neg Hx    Colon cancer Neg Hx    Esophageal cancer Neg Hx    Rectal cancer Neg Hx    Stomach cancer Neg Hx    Past Surgical History:  Procedure Laterality Date   benign breast cyst removed Left    age 13   CHOLECYSTECTOMY     HERNIA REPAIR     INSERTION OF MESH N/A 02/27/2015   Procedure: INSERTION OF MESH;  Surgeon: Krystal Russell, MD;  Location: [facility];  Service: General;  Laterality: N/A;   IR ANGIOGRAM PELVIS SELECTIVE OR SUPRASELECTIVE  12/22/2018   IR ANGIOGRAM PELVIS SELECTIVE OR SUPRASELECTIVE  12/22/2018   IR ANGIOGRAM SELECTIVE EACH ADDITIONAL VESSEL  12/22/2018   IR ANGIOGRAM SELECTIVE EACH ADDITIONAL VESSEL  12/22/2018   IR EMBO TUMOR ORGAN ISCHEMIA INFARCT INC GUIDE ROADMAPPING  12/22/2018   IR RADIOLOGIST EVAL & MGMT  09/17/2018   IR RADIOLOGIST EVAL & MGMT  01/05/2019   IR RADIOLOGIST EVAL & MGMT  10/06/2019   IR US  GUIDE VASC ACCESS RIGHT  12/22/2018   JOINT REPLACEMENT     LAPAROSCOPIC ASSISTED VENTRAL HERNIA REPAIR N/A 02/27/2015   Procedure: LAPAROSCOPIC VENTRAL HERNIA REPAIR WITH MESH;   Surgeon: Krystal Russell, MD;  Location: [facility];  Service: General;  Laterality: N/A;   THYROIDECTOMY, PARTIAL     TOTAL HIP ARTHROPLASTY Right 11/28/2017   Procedure: RIGHT TOTAL HIP ARTHROPLASTY ANTERIOR APPROACH;  Surgeon: Vernetta Lonni GRADE, MD;  Location: [facility];  Service: Orthopedics;  Laterality: Right;   TOTAL HIP ARTHROPLASTY Left 07/31/2018   Procedure: LEFT TOTAL HIP ARTHROPLASTY ANTERIOR APPROACH;  Surgeon: Vernetta Lonni GRADE, MD;  Location: [facility];  Service: Orthopedics;  Laterality: Left;  Failed Spinal   Social History   Occupational History   Not on file  Tobacco Use   Smoking status: Some Days    Current packs/day: 0.00    Types: Cigarettes    Last attempt to quit: 06/14/2017    Years since quitting: 6.9    Passive exposure: Current (Cousin)   Smokeless tobacco: Current   Tobacco comments:    Pt states she does vape and does smoke occasionally Tay 02/08/22  Vaping Use   Vaping status: Never Used  Substance and Sexual Activity   Alcohol use: Yes    Alcohol/week: 4.0 standard drinks of alcohol    Types: 4 Standard drinks or equivalent per week   Drug use: Yes    Frequency: 2.0 times per week    Types: Marijuana   Sexual activity: Yes    Birth control/protection: Pill    Comment: continuous OCP

## 2024-05-31 NOTE — Progress Notes (Signed)
 Patient says that her left knee was doing very well for awhile. She stopped taking her Meloxicam  in August and September as she was feeling so good, however she did have to begin taking it again in October as her pain began to return. Her pain flared up more about 1.5 weeks ago, and was tender to touch. She tried ice/heat and elevation at that time. She still has some swelling, although it is much improved now. She wears her knee sleeve typically 3 days a week, and says there is no pattern for when she wears it vs when she does not wear it. Her sleeve does help with stability and pain, although she notices her swelling return when she takes the sleeve off.

## 2024-06-16 ENCOUNTER — Other Ambulatory Visit (HOSPITAL_BASED_OUTPATIENT_CLINIC_OR_DEPARTMENT_OTHER): Payer: Self-pay

## 2024-06-16 ENCOUNTER — Ambulatory Visit (INDEPENDENT_AMBULATORY_CARE_PROVIDER_SITE_OTHER): Admitting: Orthopaedic Surgery

## 2024-06-16 ENCOUNTER — Ambulatory Visit (HOSPITAL_BASED_OUTPATIENT_CLINIC_OR_DEPARTMENT_OTHER): Payer: Self-pay | Admitting: Orthopaedic Surgery

## 2024-06-16 DIAGNOSIS — S83282D Other tear of lateral meniscus, current injury, left knee, subsequent encounter: Secondary | ICD-10-CM

## 2024-06-16 MED ORDER — ACETAMINOPHEN 500 MG PO TABS
500.0000 mg | ORAL_TABLET | Freq: Three times a day (TID) | ORAL | 0 refills | Status: AC
  Filled 2024-06-16: qty 30, 10d supply, fill #0

## 2024-06-16 MED ORDER — ASPIRIN 325 MG PO TBEC
325.0000 mg | DELAYED_RELEASE_TABLET | Freq: Every day | ORAL | 0 refills | Status: AC
  Filled 2024-06-16: qty 14, 14d supply, fill #0

## 2024-06-16 MED ORDER — IBUPROFEN 800 MG PO TABS
800.0000 mg | ORAL_TABLET | Freq: Three times a day (TID) | ORAL | 0 refills | Status: AC
  Filled 2024-06-16: qty 30, 10d supply, fill #0

## 2024-06-16 MED ORDER — OXYCODONE HCL 5 MG PO TABS
5.0000 mg | ORAL_TABLET | ORAL | 0 refills | Status: AC | PRN
  Filled 2024-06-16: qty 15, 3d supply, fill #0

## 2024-06-16 NOTE — Progress Notes (Signed)
 Chief Complaint: Left knee pain     History of Present Illness:   06/16/2024: Rachel Warren presents today for follow-up of her left knee.  She is still having persistent pain after an injection.  She is here today for surgical discussion  Rachel Warren is a pleasant 48 y.o. female who presents today for follow-up of acute on chronic left knee pain, also with some right knee pain.   Rachel Warren states the left knee was doing well for quite some time.  1 year ago we did perform ultrasound-guided parameniscal cyst aspiration and subsequent injection which gave her essentially complete relief for many months.  She has been taking meloxicam  15 mg daily, was even able to stop this for period of time over the summer.  However, around October her pain started to return, she has noticed swelling and discomfort over the lateral joint line.  She has had 4-5 episodes of feeling like the knee is giving out on her as well.  She has used ice/heat as well as elevation.  Swelling comes and goes, she does wear a neoprene compressive knee sleeve at times.  She has been back on meloxicam  15 mg but her pain and swelling still persists.  She is noticing pain over the right knee as well, however is not having swelling.   Pertinent ROS were reviewed with the patient and found to be negative unless otherwise specified above in HPI.     PMH/PSH/Family History/Social History/Meds/Allergies:    Past Medical History:  Diagnosis Date   Anemia    on meds   Anxiety    Arthritis    generalized   Back pain    lower back    Blood transfusion without reported diagnosis 2020   Complication of anesthesia    age 41 had twilight anesthesia and woke up during surgery    Depression    on meds   Fatty liver 01/22/2016   Noted on MRI abd   Fibroids    Heavy periods    History of gallstones    History of thyroid  nodule    Left   Hypertension    on meds   Irregular periods    Obesity    Pancreatic lesion    Pinched  nerve    in back/sciatica   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   Ventral hernia 12/26/2014   Midline noted on CT Abd pelvis   Past Surgical History:  Procedure Laterality Date   benign breast cyst removed Left    age 66   CHOLECYSTECTOMY     HERNIA REPAIR     INSERTION OF MESH N/A 02/27/2015   Procedure: INSERTION OF MESH;  Surgeon: Krystal Russell, MD;  Location: [facility];  Service: General;  Laterality: N/A;   IR ANGIOGRAM PELVIS SELECTIVE OR SUPRASELECTIVE  12/22/2018   IR ANGIOGRAM PELVIS SELECTIVE OR SUPRASELECTIVE  12/22/2018   IR ANGIOGRAM SELECTIVE EACH ADDITIONAL VESSEL  12/22/2018   IR ANGIOGRAM SELECTIVE EACH ADDITIONAL VESSEL  12/22/2018   IR EMBO TUMOR ORGAN ISCHEMIA INFARCT INC GUIDE ROADMAPPING  12/22/2018   IR RADIOLOGIST EVAL & MGMT  09/17/2018   IR RADIOLOGIST EVAL & MGMT  01/05/2019   IR RADIOLOGIST EVAL & MGMT  10/06/2019   IR US  GUIDE VASC ACCESS RIGHT  12/22/2018   JOINT REPLACEMENT     LAPAROSCOPIC ASSISTED VENTRAL HERNIA REPAIR N/A 02/27/2015   Procedure: LAPAROSCOPIC VENTRAL HERNIA REPAIR WITH MESH;  Surgeon: Krystal Russell, MD;  Location: [facility];  Service: General;  Laterality: N/A;   THYROIDECTOMY, PARTIAL     TOTAL HIP ARTHROPLASTY Right 11/28/2017   Procedure: RIGHT TOTAL HIP ARTHROPLASTY ANTERIOR APPROACH;  Surgeon: Vernetta Lonni GRADE, MD;  Location: [facility];  Service: Orthopedics;  Laterality: Right;   TOTAL HIP ARTHROPLASTY Left 07/31/2018   Procedure: LEFT TOTAL HIP ARTHROPLASTY ANTERIOR APPROACH;  Surgeon: Vernetta Lonni GRADE, MD;  Location: [facility];  Service: Orthopedics;  Laterality: Left;  Failed Spinal   Social History   Socioeconomic History   Marital status: Divorced    Spouse name: Not on file   Number of children: Not on file   Years of education: Not on file   Highest education level: Master's degree (e.g., MA, MS, MEng, MEd, MSW, MBA)  Occupational History   Not on file  Tobacco Use   Smoking status:  Some Days    Current packs/day: 0.00    Types: Cigarettes    Last attempt to quit: 06/14/2017    Years since quitting: 7.0    Passive exposure: Current (Cousin)   Smokeless tobacco: Current   Tobacco comments:    Pt states she does vape and does smoke occasionally Tay 02/08/22  Vaping Use   Vaping status: Never Used  Substance and Sexual Activity   Alcohol use: Yes    Alcohol/week: 4.0 standard drinks of alcohol    Types: 4 Standard drinks or equivalent per week   Drug use: Yes    Frequency: 2.0 times per week    Types: Marijuana   Sexual activity: Yes    Birth control/protection: Pill    Comment: continuous OCP  Other Topics Concern   Not on file  Social History Narrative   Not on file   Social Drivers of Health   Financial Resource Strain: High Risk (12/09/2023)   Overall Financial Resource Strain (CARDIA)    Difficulty of Paying Living Expenses: Hard  Food Insecurity: Food Insecurity Present (12/09/2023)   Hunger Vital Sign    Worried About Running Out of Food in the Last Year: Sometimes true    Ran Out of Food in the Last Year: Sometimes true  Transportation Needs: No Transportation Needs (12/09/2023)   PRAPARE - Administrator, Civil Service (Medical): No    Lack of Transportation (Non-Medical): No  Physical Activity: Insufficiently Active (12/09/2023)   Exercise Vital Sign    Days of Exercise per Week: 2 days    Minutes of Exercise per Session: 30 min  Stress: Stress Concern Present (12/09/2023)   [employer] of Occupational Health - Occupational Stress Questionnaire    Feeling of Stress : To some extent  Social Connections: Moderately Isolated (12/09/2023)   Social Connection and Isolation Panel    Frequency of Communication with Friends and Family: Once a week    Frequency of Social Gatherings with Friends and Family: Once a week    Attends Religious Services: 1 to 4 times per year    Active Member of [employer] or Organizations: Yes    Attends Museum/gallery Exhibitions Officer: More than 4 times per year    Marital Status: Divorced   Family History  Problem Relation Age of Onset   Hypertension Father    Arthritis Father    Clotting disorder Father    Prostate cancer Paternal Grandfather    Colon polyps Neg Hx    Colon cancer Neg Hx    Esophageal cancer Neg Hx    Rectal cancer Neg Hx  Stomach cancer Neg Hx    No Known Allergies Current Outpatient Medications  Medication Sig Dispense Refill   acetaminophen  (TYLENOL ) 500 MG tablet Take 1 tablet (500 mg total) by mouth every 8 (eight) hours for 10 days. 30 tablet 0   aspirin  EC 325 MG tablet Take 1 tablet (325 mg total) by mouth daily. 14 tablet 0   ibuprofen  (ADVIL ) 800 MG tablet Take 1 tablet (800 mg total) by mouth every 8 (eight) hours for 10 days. Please take with food, please alternate with acetaminophen  30 tablet 0   oxyCODONE  (ROXICODONE ) 5 MG immediate release tablet Take 1 tablet (5 mg total) by mouth every 4 (four) hours as needed for severe pain (pain score 7-10) or breakthrough pain. 15 tablet 0   amLODipine  (NORVASC ) 10 MG tablet Take 1 tablet (10 mg total) by mouth daily. 90 tablet 3   azelastine  (ASTELIN ) 0.1 % nasal spray Place 2 sprays into both nostrils 2 (two) times daily as needed for allergies. Use in each nostril as directed 30 mL 11   cetirizine  (ZYRTEC  ALLERGY ) 10 MG tablet Take 1 tablet (10 mg total) by mouth daily as needed for allergies. 30 tablet 11   diclofenac  sodium (VOLTAREN ) 1 % GEL Apply 2 g topically 4 (four) times daily. (Patient taking differently: Apply 2 g topically 4 (four) times daily as needed (pain).) 100 g 3   DULoxetine  (CYMBALTA ) 30 MG capsule Take 1 capsule (30 mg total) by mouth 2 (two) times daily. 180 capsule 3   EPINEPHrine  0.3 mg/0.3 mL IJ SOAJ injection Inject 0.3 mg into the muscle as needed for anaphylaxis. 2 each 3   ferrous sulfate  (FEROSUL) 325 (65 FE) MG tablet Take 1 tablet (325 mg total) by mouth daily. 90 tablet 1   gabapentin   (NEURONTIN ) 300 MG capsule Take 1 capsule (300 mg total) by mouth 3 (three) times daily. 270 capsule 3   LO LOESTRIN FE 1 MG-10 MCG / 10 MCG tablet      meloxicam  (MOBIC ) 15 MG tablet Take 1 tablet by mouth once daily 30 tablet 0   metoprolol  succinate (TOPROL -XL) 50 MG 24 hr tablet TAKE 1 TABLET BY MOUTH ONCE DAILY TAKE  WITH  OR  IMMEDIATELY  FORLLOWING  A  MEAL 90 tablet 1   naproxen sodium (ALEVE) 220 MG tablet Take 220 mg by mouth as needed (pain/headache).     potassium chloride  SA (KLOR-CON  M) 20 MEQ tablet Take 2 tablets daily x 4 days.  Then take one tab daily.  Can be dissolved in water  if too large to swallow. 90 tablet 0   No current facility-administered medications for this visit.   No results found.  Review of Systems:   A ROS was performed including pertinent positives and negatives as documented in the HPI.  Physical Exam :   Constitutional: NAD and appears stated age Neurological: Alert and oriented Psych: Appropriate affect and cooperative There were no vitals taken for this visit.   Comprehensive Musculoskeletal Exam:    Left knee with tenderness about the anterior lateral knee with positive McMurray about the lateral joint line negative Lachman negative posterior drawer range of motion is from -3 to 135 degrees positive  Imaging:   Xray (views left knee): Normal  MRI (left knee): There is an anterior horn complex tear of the lateral meniscus   I personally reviewed and interpreted the radiographs.   Assessment and Plan:   48 y.o. female with a complex tear of the anterior horn  of the lateral meniscus.  At this time she has trialed an injection without any relief.  She trialed activity restriction anti-inflammatories again without persistent relief.  I did discuss the possibility of left knee arthroscopy with lateral meniscal repair.  I did discuss risks and limitation as well as associated recovery timeframe.  After discussion she would like to  proceed  -Plan for left knee arthroscopy lateral meniscal repair   After a lengthy discussion of treatment options, including risks, benefits, alternatives, complications of surgical and nonsurgical conservative options, the patient elected surgical repair.   The patient  is aware of the material risks  and complications including, but not limited to injury to adjacent structures, neurovascular injury, infection, numbness, bleeding, implant failure, thermal burns, stiffness, persistent pain, failure to heal, disease transmission from allograft, need for further surgery, dislocation, anesthetic risks, blood clots, risks of death,and others. The probabilities of surgical success and failure discussed with patient given their particular co-morbidities.The time and nature of expected rehabilitation and recovery was discussed.The patient's questions were all answered preoperatively.  No barriers to understanding were noted. I explained the natural history of the disease process and Rx rationale.  I explained to the patient what I considered to be reasonable expectations given their personal situation.  The final treatment plan was arrived at through a shared patient decision making process model.    I personally saw and evaluated the patient, and participated in the management and treatment plan.  Elspeth Parker, MD Attending Physician, Orthopedic Surgery  This document was dictated using Dragon voice recognition software. A reasonable attempt at proof reading has been made to minimize errors.

## 2024-06-18 ENCOUNTER — Other Ambulatory Visit: Payer: Self-pay | Admitting: Family Medicine

## 2024-06-19 ENCOUNTER — Other Ambulatory Visit: Payer: Self-pay | Admitting: Sports Medicine

## 2024-06-19 DIAGNOSIS — G8929 Other chronic pain: Secondary | ICD-10-CM

## 2024-06-19 DIAGNOSIS — M1712 Unilateral primary osteoarthritis, left knee: Secondary | ICD-10-CM

## 2024-07-17 ENCOUNTER — Other Ambulatory Visit: Payer: Self-pay | Admitting: Sports Medicine

## 2024-07-17 DIAGNOSIS — G8929 Other chronic pain: Secondary | ICD-10-CM

## 2024-07-17 DIAGNOSIS — M1712 Unilateral primary osteoarthritis, left knee: Secondary | ICD-10-CM

## 2024-07-22 ENCOUNTER — Other Ambulatory Visit: Payer: Self-pay | Admitting: Family Medicine

## 2024-07-22 DIAGNOSIS — G8929 Other chronic pain: Secondary | ICD-10-CM

## 2024-07-26 ENCOUNTER — Encounter: Payer: Self-pay | Admitting: Family Medicine

## 2024-07-29 ENCOUNTER — Other Ambulatory Visit: Payer: Self-pay | Admitting: Family Medicine

## 2024-07-29 DIAGNOSIS — G8929 Other chronic pain: Secondary | ICD-10-CM

## 2024-08-03 ENCOUNTER — Encounter: Payer: Self-pay | Admitting: Orthopaedic Surgery

## 2024-08-03 DIAGNOSIS — S83282A Other tear of lateral meniscus, current injury, left knee, initial encounter: Secondary | ICD-10-CM | POA: Diagnosis not present

## 2024-08-03 NOTE — Progress Notes (Signed)
 "  Date of Surgery: 08/03/2024  INDICATIONS: Ms. Rachel Warren is a 49 y.o.-year-old female with left knee lateral meniscal tear.  The risk and benefits of the procedure were discussed in detail and documented in the pre-operative evaluation.   PREOPERATIVE DIAGNOSIS: 1. Left knee lateral meniscal tear  POSTOPERATIVE DIAGNOSIS: Same.  PROCEDURE: 1. Left knee lateral meniscal repair  SURGEON: Elspeth LITTIE Parker MD  ASSISTANT: Conley Dawson, ATC  ANESTHESIA:  general  IV FLUIDS AND URINE: See anesthesia record.  ANTIBIOTICS: Ancef   ESTIMATED BLOOD LOSS: 5 mL.  IMPLANTS:  * No surgical log found *  DRAINS: None  CULTURES: None  COMPLICATIONS: none  DESCRIPTION OF PROCEDURE:   Examination under anesthesia: A careful examination under anesthesia was performed.  Knee ROM motion was: -3 - 135 Lachman: Normal Pivot Shift: Normal Posterior drawer: normal.   Varus stability in full extension: normal.   Varus stability in 30 degrees of flexion: normal.  Valgus stability in full extension: normal.   Valgus stability in 30 degrees of flexion: normal.  Posterolateral drawer: normal   Intra-operative findings: A thorough arthroscopic examination of the knee was performed.  The findings are: 1. Suprapatellar pouch: Normal 2. Undersurface of median ridge: Normal 3. Medial patellar facet: Grade 2 chondral loss 4. Lateral patellar facet: Grade 2 chondral loss 5. Trochlea: Grade 4 chondral loss 6. Lateral gutter/popliteus tendon: Normal 7. Hoffa's fat pad: Normal 8. Medial gutter/plica: Normal 9. ACL: Normal 10. PCL: Normal 11. Medial meniscus: Normal 12. Medial compartment cartilage: Normal 13. Lateral meniscus: Severe tearing with horizontal longitudinal split 14. Lateral compartment cartilage: Grade 2 chondral loss  I identified the patient in the pre-operative holding area.  I marked the operative knee with my initials. I reviewed the risks and benefits of the proposed surgical  intervention and the patient wished to proceed.  Anesthesia performed a peripheral nerve block.  Patient was subsequently taken back to the operating room.  The patient was transferred to the operative suite and placed in the supine position with all bony prominences padded.     SCDs were placed on the non-operative lower extremity. Appropriate antibiotics was administered within 1 hour before incision. The operative lower extremity was then prepped and draped in standard fashion. A time out was performed confirming the correct extremity, correct patient and correct procedure.    A standard anterolateral portal was made with an 11 blade.  The ideal position for the anteromedial portal was established using a spinal needle.  This AM portal was then created under direct visualization with an 11 blade.  A full diagnostic arthroscopy was then performed, as described above, including probing of the chondral and meniscal surfaces.     At this time the shaver was introduced and the lateral meniscus was debrided.  2 all inside meniscal repair devices were used to connect the superior and inferior leaflets together to provide a compression mechanism.  I was happy with the quality of the repair  That concluded the case.  Skin was closed with 2-0 Vicryl and 3-0 nylon. Xeroform gauze, gauze, Tegaderm, Iceman and brace were applied.  Instrument, sponge, and needle counts were correct prior to wound closure and at the conclusion of the case.  The patient was taken to the PACU without complication     POSTOPERATIVE PLAN: She will be weightbearing and activity as tolerated.  She be placed on aspirin  for blood clot prevention.  I will see her back in 2 weeks for suture removal  Elspeth LITTIE Parker,  MD 2:55 PM    "

## 2024-08-05 NOTE — Therapy (Incomplete)
 " OUTPATIENT PHYSICAL THERAPY LOWER EXTREMITY EVALUATION   Patient Name: Rachel Warren MRN: [id number] DOB:06/08/1976, 49 y.o., female Today's Date: 08/05/2024  END OF SESSION:   Past Medical History:  Diagnosis Date   Anemia    on meds   Anxiety    Arthritis    generalized   Back pain    lower back    Blood transfusion without reported diagnosis 2020   Complication of anesthesia    age 6 had twilight anesthesia and woke up during surgery    Depression    on meds   Fatty liver 01/22/2016   Noted on MRI abd   Fibroids    Heavy periods    History of gallstones    History of thyroid  nodule    Left   Hypertension    on meds   Irregular periods    Obesity    Pancreatic lesion    Pinched nerve    in back/sciatica   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   Ventral hernia 12/26/2014   Midline noted on CT Abd pelvis   Past Surgical History:  Procedure Laterality Date   benign breast cyst removed Left    age 43   CHOLECYSTECTOMY     HERNIA REPAIR     INSERTION OF MESH N/A 02/27/2015   Procedure: INSERTION OF MESH;  Surgeon: Krystal Russell, MD;  Location: [facility];  Service: General;  Laterality: N/A;   IR ANGIOGRAM PELVIS SELECTIVE OR SUPRASELECTIVE  12/22/2018   IR ANGIOGRAM PELVIS SELECTIVE OR SUPRASELECTIVE  12/22/2018   IR ANGIOGRAM SELECTIVE EACH ADDITIONAL VESSEL  12/22/2018   IR ANGIOGRAM SELECTIVE EACH ADDITIONAL VESSEL  12/22/2018   IR EMBO TUMOR ORGAN ISCHEMIA INFARCT INC GUIDE ROADMAPPING  12/22/2018   IR RADIOLOGIST EVAL & MGMT  09/17/2018   IR RADIOLOGIST EVAL & MGMT  01/05/2019   IR RADIOLOGIST EVAL & MGMT  10/06/2019   IR US  GUIDE VASC ACCESS RIGHT  12/22/2018   JOINT REPLACEMENT     LAPAROSCOPIC ASSISTED VENTRAL HERNIA REPAIR N/A 02/27/2015   Procedure: LAPAROSCOPIC VENTRAL HERNIA REPAIR WITH MESH;  Surgeon: Krystal Russell, MD;  Location: [facility];  Service: General;  Laterality: N/A;   THYROIDECTOMY, PARTIAL     TOTAL HIP  ARTHROPLASTY Right 11/28/2017   Procedure: RIGHT TOTAL HIP ARTHROPLASTY ANTERIOR APPROACH;  Surgeon: Vernetta Lonni GRADE, MD;  Location: [facility];  Service: Orthopedics;  Laterality: Right;   TOTAL HIP ARTHROPLASTY Left 07/31/2018   Procedure: LEFT TOTAL HIP ARTHROPLASTY ANTERIOR APPROACH;  Surgeon: Vernetta Lonni GRADE, MD;  Location: [facility];  Service: Orthopedics;  Laterality: Left;  Failed Spinal   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   Fibroids 01/22/2018   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/28/2017   Unilateral primary osteoarthritis, 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  Rationale  for Evaluation and Treatment: Rehabilitation  ONSET DATE: 06/16/24 Referral  NEXT MD VISIT: *** SUBJECTIVE:   SUBJECTIVE STATEMENT: ***  PERTINENT HISTORY: Arthritis, Anxiety, Depression, Hypertension, Pinched Nerve, Low Back Pain, L THA 07/31/2018, R THA 11/28/2017  DIAGNOSTIC FINDINGS:  US  Extrem Low Left Ltd  Limited musculoskeletal ultrasound of the left knee was performed today. There is a trace knee joint effusion. Overlying quadricep tendon shows proper insertion without evidence of tearing. With attention to the lateral joint line, there is incompletely visualized lateral meniscus which does appear to have degenerative tear with meniscal  body extrusion. Emanating from this extrusion is a hypoechoic cystic structure indicative of likely parameniscal cyst. This is multiloculated in nature.   PAIN:  NPRS scale: ***/10 Pain location: *** Pain description: *** Aggravating factors: *** Relieving factors: ***  PRECAUTIONS: {Therapy precautions:24002}  WEIGHT BEARING RESTRICTIONS: {Yes ***/No:24003}  FALLS:  Has patient fallen in last 6 months? {fallsyesno:27318}  LIVING ENVIRONMENT: Lives with: {[facility] lives with:25569::lives with their family} Lives in: {Lives in:25570} Stairs: {opstairs:27293} Has following equipment at home: {Assistive devices:23999}  OCCUPATION: ***  PLOF: Independent  PATIENT GOALS: ***  Next MD visit:  OBJECTIVE:   PATIENT SURVEYS:  Patient-Specific Activity Scoring Scheme  0 represents unable to perform. 10 represents able to perform at prior level. 0 1 2 3 4 5 6 7 8 9  10 (Date and Score)  Activity Eval     1. ***      2. ***      3. ***    4.    5.    Score ***    Total score = sum of the activity scores/number of activities Minimum detectable change (90%CI) for average score = 2 points Minimum detectable change (90%CI) for single activity score = 3 points  COGNITION: Overall cognitive status: WFL    SENSATION: {sensation:27233}  EDEMA:  {edema:24020} RLE: above knee ***cm  around knee cm  below knee cm LLE: above knee ***cm  around knee cm  below knee cm  MUSCLE LENGTH: Hamstrings: Right *** deg; Left *** deg Thomas test: Right *** deg; Left *** deg  POSTURE:  {posture:25561}  PALPATION: ***  LOWER EXTREMITY ROM:   ROM Right eval Left eval  Hip flexion    Hip extension    Hip abduction    Hip adduction    Hip internal rotation    Hip external rotation    Knee flexion    Knee extension    Ankle dorsiflexion    Ankle plantarflexion    Ankle inversion    Ankle eversion     (Blank rows = not tested)  LOWER EXTREMITY MMT:  MMT Right eval  Left eval  Hip flexion    Hip extension    Hip abduction    Hip adduction    Hip internal rotation    Hip external rotation    Knee flexion    Knee extension    Ankle dorsiflexion    Ankle plantarflexion    Ankle inversion    Ankle eversion     (Blank rows = not tested)  LOWER EXTREMITY SPECIAL TESTS:  {LEspecialtests:26242}  FUNCTIONAL TESTS:  18 inch chair transfer: Lt SLS: Rt SLS:  GAIT: Distance walked: *** Assistive device utilized: {Assistive devices:23999} Level of assistance: {Levels of assistance:24026} Comments: ***  TODAY'S TREATMENT                                                                          DATE: *** Therapeutic Exercise: HEP instruction/performance c cues for techniques, handout provided.  Trial set performed of each for comprehension and symptom assessment.  See below for exercise list  PATIENT EDUCATION:  Education details: HEP, POC Person educated: Patient Education method: Explanation, Demonstration, Verbal cues, and Handouts Education comprehension: verbalized understanding, returned demonstration, and verbal cues required  HOME EXERCISE PROGRAM: ***  ASSESSMENT:  CLINICAL IMPRESSION: Patient is a 49 y.o. who comes to clinic with complaints of left knee pain and left knee lateral meniscus tear with mobility, strength and movement coordination deficits that impair their ability to perform usual daily and recreational functional activities without increase difficulty/symptoms at this time.  Patient to benefit from skilled PT services to address impairments and limitations to improve to previous level of function without restriction secondary to condition.   OBJECTIVE IMPAIRMENTS: {opptimpairments:25111}.   ACTIVITY LIMITATIONS: {activitylimitations:27494}  PARTICIPATION  LIMITATIONS: {participationrestrictions:25113}  PERSONAL FACTORS: {Personal factors:25162} are also affecting patient's functional outcome.   REHAB POTENTIAL: {rehabpotential:25112}  CLINICAL DECISION MAKING: {clinical decision making:25114}  EVALUATION COMPLEXITY: {Evaluation complexity:25115}   GOALS: Goals reviewed with patient? Yes  SHORT TERM GOALS: (target date for Short term goals ***)   1.  Patient will demonstrate independent use of home exercise program to maintain progress from in clinic treatments.  Goal status: New  LONG TERM GOALS: (target dates for all long term goals  *** )   1. Patient will demonstrate/report pain at worst less than or equal to 2/10 to facilitate minimal limitation in daily activity secondary to pain symptoms. Goal status: New   2. Patient will demonstrate independent use of home exercise program to facilitate ability to maintain/progress functional gains from skilled physical therapy services. Goal status: New   3. Patient will demonstrate Patient specific functional scale avg > or = *** to indicate reduced disability due to condition.  Goal status: New   4.  Patient will demonstrate *** LE MMT 5/5 throughout to faciltiate usual transfers, stairs, squatting at [facility] for daily life.  Goal status: New   5.  Patient will demonstrate Goal status: New   6.  *** Goal status: New   7.  *** Goal Status: New   PLAN:  PT FREQUENCY: {rehab frequency:25116}  PT DURATION: {rehab duration:25117}  PLANNED INTERVENTIONS: {rehab planned interventions:25118::97110-Therapeutic exercises,97530- Therapeutic [phone]- Neuromuscular re-education,97535- Self Rjmz,02859- Manual therapy,Patient/Family education}  PLAN FOR NEXT SESSION: ***  ***SIGN    "

## 2024-08-06 ENCOUNTER — Telehealth (HOSPITAL_BASED_OUTPATIENT_CLINIC_OR_DEPARTMENT_OTHER): Payer: Self-pay | Admitting: Orthopaedic Surgery

## 2024-08-06 NOTE — Telephone Encounter (Signed)
Post op question 

## 2024-08-09 ENCOUNTER — Ambulatory Visit: Admitting: Physical Therapy

## 2024-08-16 ENCOUNTER — Other Ambulatory Visit: Payer: Self-pay | Admitting: Sports Medicine

## 2024-08-16 DIAGNOSIS — M1712 Unilateral primary osteoarthritis, left knee: Secondary | ICD-10-CM

## 2024-08-16 DIAGNOSIS — G8929 Other chronic pain: Secondary | ICD-10-CM

## 2024-08-18 ENCOUNTER — Ambulatory Visit (INDEPENDENT_AMBULATORY_CARE_PROVIDER_SITE_OTHER): Admitting: Orthopaedic Surgery

## 2024-08-18 DIAGNOSIS — M25562 Pain in left knee: Secondary | ICD-10-CM

## 2024-08-18 DIAGNOSIS — G8929 Other chronic pain: Secondary | ICD-10-CM

## 2024-08-18 NOTE — Progress Notes (Signed)
 "                                Post Operative Evaluation    Procedure/Date of Surgery: Left knee lateral meniscal repair 1/20  Interval History:   Presents today for 2-week follow-up after the above procedure.  At this time she is doing well.  She states that her pain is improved although she is just having more surgical type pain at this point.  She has been walking with a cane.   PMH/PSH/Family History/Social History/Meds/Allergies:    Past Medical History:  Diagnosis Date   Anemia    on meds   Anxiety    Arthritis    generalized   Back pain    lower back    Blood transfusion without reported diagnosis 2020   Complication of anesthesia    age 31 had twilight anesthesia and woke up during surgery    Depression    on meds   Fatty liver 01/22/2016   Noted on MRI abd   Fibroids    Heavy periods    History of gallstones    History of thyroid  nodule    Left   Hypertension    on meds   Irregular periods    Obesity    Pancreatic lesion    Pinched nerve    in back/sciatica   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   Ventral hernia 12/26/2014   Midline noted on CT Abd pelvis   Past Surgical History:  Procedure Laterality Date   benign breast cyst removed Left    age 54   CHOLECYSTECTOMY     HERNIA REPAIR     INSERTION OF MESH N/A 02/27/2015   Procedure: INSERTION OF MESH;  Surgeon: Krystal Russell, MD;  Location: [facility];  Service: General;  Laterality: N/A;   IR ANGIOGRAM PELVIS SELECTIVE OR SUPRASELECTIVE  12/22/2018   IR ANGIOGRAM PELVIS SELECTIVE OR SUPRASELECTIVE  12/22/2018   IR ANGIOGRAM SELECTIVE EACH ADDITIONAL VESSEL  12/22/2018   IR ANGIOGRAM SELECTIVE EACH ADDITIONAL VESSEL  12/22/2018   IR EMBO TUMOR ORGAN ISCHEMIA INFARCT INC GUIDE ROADMAPPING  12/22/2018   IR RADIOLOGIST EVAL & MGMT  09/17/2018   IR RADIOLOGIST EVAL & MGMT  01/05/2019   IR RADIOLOGIST EVAL & MGMT  10/06/2019   IR US  GUIDE VASC ACCESS RIGHT  12/22/2018   JOINT  REPLACEMENT     LAPAROSCOPIC ASSISTED VENTRAL HERNIA REPAIR N/A 02/27/2015   Procedure: LAPAROSCOPIC VENTRAL HERNIA REPAIR WITH MESH;  Surgeon: Krystal Russell, MD;  Location: [facility];  Service: General;  Laterality: N/A;   THYROIDECTOMY, PARTIAL     TOTAL HIP ARTHROPLASTY Right 11/28/2017   Procedure: RIGHT TOTAL HIP ARTHROPLASTY ANTERIOR APPROACH;  Surgeon: Vernetta Lonni GRADE, MD;  Location: [facility];  Service: Orthopedics;  Laterality: Right;   TOTAL HIP ARTHROPLASTY Left 07/31/2018   Procedure: LEFT TOTAL HIP ARTHROPLASTY ANTERIOR APPROACH;  Surgeon: Vernetta Lonni GRADE, MD;  Location: [facility];  Service: Orthopedics;  Laterality: Left;  Failed Spinal   Social History   Socioeconomic History   Marital status: Divorced    Spouse name: Not on file   Number of children: Not on file   Years of education: Not on file   Highest education level: Master's degree (e.g., MA, MS, MEng, MEd, MSW, MBA)  Occupational History   Not on file  Tobacco Use   Smoking status: Some Days  Current packs/day: 0.00    Types: Cigarettes    Last attempt to quit: 06/14/2017    Years since quitting: 7.1    Passive exposure: Current (Cousin)   Smokeless tobacco: Current   Tobacco comments:    Pt states she does vape and does smoke occasionally Tay 02/08/22  Vaping Use   Vaping status: Never Used  Substance and Sexual Activity   Alcohol use: Yes    Alcohol/week: 4.0 standard drinks of alcohol    Types: 4 Standard drinks or equivalent per week   Drug use: Yes    Frequency: 2.0 times per week    Types: Marijuana   Sexual activity: Yes    Birth control/protection: Pill    Comment: continuous OCP  Other Topics Concern   Not on file  Social History Narrative   Not on file   Social Drivers of Health   Tobacco Use: High Risk (05/31/2024)   Patient History    Smoking Tobacco Use: Some Days    Smokeless Tobacco Use: Current    Passive Exposure: Current  Financial Resource Strain: High Risk  (12/09/2023)   Overall Financial Resource Strain (CARDIA)    Difficulty of Paying Living Expenses: Hard  Food Insecurity: Food Insecurity Present (12/09/2023)   Hunger Vital Sign    Worried About Running Out of Food in the Last Year: Sometimes true    Ran Out of Food in the Last Year: Sometimes true  Transportation Needs: No Transportation Needs (12/09/2023)   PRAPARE - Administrator, Civil Service (Medical): No    Lack of Transportation (Non-Medical): No  Physical Activity: Insufficiently Active (12/09/2023)   Exercise Vital Sign    Days of Exercise per Week: 2 days    Minutes of Exercise per Session: 30 min  Stress: Stress Concern Present (12/09/2023)   [employer] of Occupational Health - Occupational Stress Questionnaire    Feeling of Stress : To some extent  Social Connections: Moderately Isolated (12/09/2023)   Social Connection and Isolation Panel    Frequency of Communication with Friends and Family: Once a week    Frequency of Social Gatherings with Friends and Family: Once a week    Attends Religious Services: 1 to 4 times per year    Active Member of Clubs or Organizations: Yes    Attends Banker Meetings: More than 4 times per year    Marital Status: Divorced  Depression (PHQ2-9): Medium Risk (03/22/2024)   Depression (PHQ2-9)    PHQ-2 Score: 7  Alcohol Screen: Low Risk (12/09/2023)   Alcohol Screen    Last Alcohol Screening Score (AUDIT): 4  Housing: High Risk (12/09/2023)   Housing Stability Vital Sign    Unable to Pay for Housing in the Last Year: Yes    Number of Times Moved in the Last Year: Not on file    Homeless in the Last Year: No  Utilities: Not on file  Health Literacy: Not on file   Family History  Problem Relation Age of Onset   Hypertension Father    Arthritis Father    Clotting disorder Father    Prostate cancer Paternal Grandfather    Colon polyps Neg Hx    Colon cancer Neg Hx    Esophageal cancer Neg Hx    Rectal  cancer Neg Hx    Stomach cancer Neg Hx    Allergies[1] Current Outpatient Medications  Medication Sig Dispense Refill   amLODipine  (NORVASC ) 10 MG tablet Take 1 tablet (10 mg  total) by mouth daily. 90 tablet 3   aspirin  EC 325 MG tablet Take 1 tablet (325 mg total) by mouth daily. 14 tablet 0   azelastine  (ASTELIN ) 0.1 % nasal spray Place 2 sprays into both nostrils 2 (two) times daily as needed for allergies. Use in each nostril as directed 30 mL 11   cetirizine  (ZYRTEC  ALLERGY ) 10 MG tablet Take 1 tablet (10 mg total) by mouth daily as needed for allergies. 30 tablet 11   diclofenac  sodium (VOLTAREN ) 1 % GEL Apply 2 g topically 4 (four) times daily. (Patient taking differently: Apply 2 g topically 4 (four) times daily as needed (pain).) 100 g 3   DULoxetine  (CYMBALTA ) 30 MG capsule Take 1 capsule by mouth twice daily 180 capsule 0   EPINEPHrine  0.3 mg/0.3 mL IJ SOAJ injection Inject 0.3 mg into the muscle as needed for anaphylaxis. 2 each 3   ferrous sulfate  (FEROSUL) 325 (65 FE) MG tablet Take 1 tablet (325 mg total) by mouth daily. 90 tablet 1   gabapentin  (NEURONTIN ) 300 MG capsule Take 1 capsule (300 mg total) by mouth 3 (three) times daily. 270 capsule 3   LO LOESTRIN FE 1 MG-10 MCG / 10 MCG tablet      meloxicam  (MOBIC ) 15 MG tablet Take 1 tablet by mouth once daily 30 tablet 0   metoprolol  succinate (TOPROL -XL) 50 MG 24 hr tablet TAKE 1 TABLET BY MOUTH ONCE DAILY. TAKE WITH OR IMMEDIATELY FOLLOWING A MEAL. 90 tablet 0   naproxen sodium (ALEVE) 220 MG tablet Take 220 mg by mouth as needed (pain/headache).     oxyCODONE  (ROXICODONE ) 5 MG immediate release tablet Take 1 tablet (5 mg total) by mouth every 4 (four) hours as needed for severe pain (pain score 7-10) or breakthrough pain. 15 tablet 0   potassium chloride  SA (KLOR-CON  M) 20 MEQ tablet Take 2 tablets daily x 4 days.  Then take one tab daily.  Can be dissolved in water  if too large to swallow. 90 tablet 0   No current  facility-administered medications for this visit.   No results found.  Review of Systems:   A ROS was performed including pertinent positives and negatives as documented in the HPI.   Musculoskeletal Exam:    There were no vitals taken for this visit.  Left knee with well-appearing incisions.  Range of motion is from 0 to 125 degrees no joint line tenderness  Imaging:      I personally reviewed and interpreted the radiographs.   Assessment:   2 weeks status post left knee lateral meniscal repair doing well.  At this time she will continue to progress through range of motion with physical therapy as well as gait training and I will see her back in 4 weeks for reassessment  Plan :    - Return to clinic 4 weeks for reassessment      I personally saw and evaluated the patient, and participated in the management and treatment plan.  Elspeth Parker, MD Attending Physician, Orthopedic Surgery  This document was dictated using Dragon voice recognition software. A reasonable attempt at proof reading has been made to minimize errors.      [1] No Known Allergies  "

## 2024-08-23 ENCOUNTER — Ambulatory Visit: Admitting: Physical Therapy

## 2024-09-15 ENCOUNTER — Encounter (HOSPITAL_BASED_OUTPATIENT_CLINIC_OR_DEPARTMENT_OTHER): Admitting: Orthopaedic Surgery

## 2024-10-27 ENCOUNTER — Ambulatory Visit: Admitting: Internal Medicine
